# Patient Record
Sex: Female | Born: 1965 | Race: Black or African American | Hispanic: No | Smoking: Never smoker
Health system: Southern US, Community
[De-identification: ages and names within clinical notes are randomized; demographics above are authoritative.]

## PROBLEM LIST (undated history)

## (undated) ENCOUNTER — Emergency Department (HOSPITAL_COMMUNITY): Admission: EM | Payer: Medicaid Other

## (undated) DIAGNOSIS — C55 Malignant neoplasm of uterus, part unspecified: Secondary | ICD-10-CM

## (undated) DIAGNOSIS — G2581 Restless legs syndrome: Secondary | ICD-10-CM

## (undated) DIAGNOSIS — K219 Gastro-esophageal reflux disease without esophagitis: Secondary | ICD-10-CM

## (undated) DIAGNOSIS — D649 Anemia, unspecified: Secondary | ICD-10-CM

## (undated) DIAGNOSIS — F32A Depression, unspecified: Secondary | ICD-10-CM

## (undated) DIAGNOSIS — J45909 Unspecified asthma, uncomplicated: Secondary | ICD-10-CM

## (undated) DIAGNOSIS — J189 Pneumonia, unspecified organism: Secondary | ICD-10-CM

## (undated) DIAGNOSIS — Z923 Personal history of irradiation: Secondary | ICD-10-CM

## (undated) DIAGNOSIS — I10 Essential (primary) hypertension: Secondary | ICD-10-CM

## (undated) DIAGNOSIS — G459 Transient cerebral ischemic attack, unspecified: Secondary | ICD-10-CM

## (undated) DIAGNOSIS — E669 Obesity, unspecified: Secondary | ICD-10-CM

## (undated) DIAGNOSIS — G4733 Obstructive sleep apnea (adult) (pediatric): Secondary | ICD-10-CM

## (undated) DIAGNOSIS — G4700 Insomnia, unspecified: Secondary | ICD-10-CM

## (undated) DIAGNOSIS — I639 Cerebral infarction, unspecified: Secondary | ICD-10-CM

## (undated) DIAGNOSIS — F319 Bipolar disorder, unspecified: Secondary | ICD-10-CM

## (undated) DIAGNOSIS — F419 Anxiety disorder, unspecified: Secondary | ICD-10-CM

## (undated) HISTORY — DX: Personal history of irradiation: Z92.3

## (undated) HISTORY — DX: Depression, unspecified: F32.A

## (undated) HISTORY — DX: Transient cerebral ischemic attack, unspecified: G45.9

## (undated) HISTORY — PX: BREAST LUMPECTOMY: SHX2

## (undated) HISTORY — DX: Cerebral infarction, unspecified: I63.9

## (undated) HISTORY — DX: Bipolar disorder, unspecified: F31.9

## (undated) HISTORY — DX: Obstructive sleep apnea (adult) (pediatric): G47.33

## (undated) HISTORY — DX: Malignant neoplasm of uterus, part unspecified: C55

## (undated) HISTORY — DX: Unspecified asthma, uncomplicated: J45.909

## (undated) HISTORY — DX: Anxiety disorder, unspecified: F41.9

---

## 1988-10-21 HISTORY — PX: TUBAL LIGATION: SHX77

## 1998-03-27 ENCOUNTER — Emergency Department (HOSPITAL_COMMUNITY): Admission: EM | Admit: 1998-03-27 | Discharge: 1998-03-27 | Payer: Self-pay | Admitting: Emergency Medicine

## 2001-06-09 ENCOUNTER — Emergency Department (HOSPITAL_COMMUNITY): Admission: EM | Admit: 2001-06-09 | Discharge: 2001-06-09 | Payer: Self-pay | Admitting: Emergency Medicine

## 2001-06-09 ENCOUNTER — Encounter: Payer: Self-pay | Admitting: Emergency Medicine

## 2001-06-10 ENCOUNTER — Ambulatory Visit (HOSPITAL_COMMUNITY): Admission: RE | Admit: 2001-06-10 | Discharge: 2001-06-10 | Payer: Self-pay | Admitting: Emergency Medicine

## 2001-06-10 ENCOUNTER — Encounter: Payer: Self-pay | Admitting: Emergency Medicine

## 2001-06-18 ENCOUNTER — Encounter: Admission: RE | Admit: 2001-06-18 | Discharge: 2001-06-18 | Payer: Self-pay | Admitting: Internal Medicine

## 2004-12-21 ENCOUNTER — Ambulatory Visit: Payer: Self-pay | Admitting: Internal Medicine

## 2005-02-21 ENCOUNTER — Ambulatory Visit: Payer: Self-pay | Admitting: Internal Medicine

## 2005-04-03 ENCOUNTER — Emergency Department (HOSPITAL_COMMUNITY): Admission: EM | Admit: 2005-04-03 | Discharge: 2005-04-04 | Payer: Self-pay | Admitting: Emergency Medicine

## 2005-04-08 ENCOUNTER — Ambulatory Visit: Payer: Self-pay | Admitting: Internal Medicine

## 2005-04-30 ENCOUNTER — Ambulatory Visit: Payer: Self-pay | Admitting: Family Medicine

## 2006-04-15 ENCOUNTER — Ambulatory Visit: Payer: Self-pay | Admitting: Internal Medicine

## 2007-03-05 ENCOUNTER — Ambulatory Visit (HOSPITAL_COMMUNITY): Admission: RE | Admit: 2007-03-05 | Discharge: 2007-03-05 | Payer: Self-pay | Admitting: Family Medicine

## 2010-04-11 ENCOUNTER — Ambulatory Visit (HOSPITAL_COMMUNITY): Admission: RE | Admit: 2010-04-11 | Discharge: 2010-04-11 | Payer: Self-pay | Admitting: Family Medicine

## 2011-11-02 ENCOUNTER — Emergency Department (HOSPITAL_COMMUNITY)
Admission: EM | Admit: 2011-11-02 | Discharge: 2011-11-02 | Disposition: A | Discharge status: Home / Self Care | Payer: Medicaid Other | Attending: Emergency Medicine | Admitting: Emergency Medicine

## 2011-11-02 ENCOUNTER — Emergency Department (HOSPITAL_COMMUNITY): Payer: Medicaid Other

## 2011-11-02 ENCOUNTER — Encounter (HOSPITAL_COMMUNITY): Payer: Self-pay | Admitting: *Deleted

## 2011-11-02 DIAGNOSIS — R404 Transient alteration of awareness: Secondary | ICD-10-CM | POA: Insufficient documentation

## 2011-11-02 DIAGNOSIS — R51 Headache: Secondary | ICD-10-CM | POA: Insufficient documentation

## 2011-11-02 DIAGNOSIS — S0990XA Unspecified injury of head, initial encounter: Secondary | ICD-10-CM

## 2011-11-02 DIAGNOSIS — R21 Rash and other nonspecific skin eruption: Secondary | ICD-10-CM | POA: Insufficient documentation
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
## 2011-11-02 NOTE — ED Notes (Signed)
Patient transported to CT 
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
## 2011-11-03 NOTE — ED Provider Notes (Signed)
Medical screening examination/treatment/procedure(s) were performed by non-physician practitioner and as supervising physician I was immediately available for consultation/collaboration.  Loren Racer, MD 11/03/11 [phone]

## 2012-08-20 ENCOUNTER — Emergency Department (HOSPITAL_COMMUNITY)
Admission: EM | Admit: 2012-08-20 | Discharge: 2012-08-21 | Disposition: A | Discharge status: Home / Self Care | Payer: Medicaid Other | Attending: Emergency Medicine | Admitting: Emergency Medicine

## 2012-08-20 ENCOUNTER — Encounter (HOSPITAL_COMMUNITY): Payer: Self-pay | Admitting: Adult Health

## 2012-08-20 DIAGNOSIS — F319 Bipolar disorder, unspecified: Secondary | ICD-10-CM | POA: Insufficient documentation

## 2012-08-20 DIAGNOSIS — G4700 Insomnia, unspecified: Secondary | ICD-10-CM | POA: Insufficient documentation

## 2012-08-20 HISTORY — DX: Bipolar disorder, unspecified: F31.9

## 2012-08-20 NOTE — ED Notes (Signed)
Pt. Reports difficulty sleeping x1 week. States she has a hard time falling asleep and staying asleep. Reports 1-2 hours of sleep per night.

## 2012-08-20 NOTE — ED Notes (Addendum)
Pt reports inability to sleep for one week. Denies pain.  No other complaints.

## 2012-08-21 MED ORDER — ZOLPIDEM TARTRATE 5 MG PO TABS: 5.0000 mg | ORAL_TABLET | Freq: Every evening | ORAL | Status: DC | PRN

## 2012-08-21 NOTE — ED Provider Notes (Signed)
History     CSN: [id number]  Arrival date & time 08/20/12  2255   First MD Initiated Contact with Patient 08/21/12 0007      Chief Complaint  Patient presents with  . Insomnia    (Consider location/radiation/quality/duration/timing/severity/associated sxs/prior treatment) The history is provided by the patient.   46 year old female states that she has not been asleep for the last 5 days. 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 Patient denies other complaints. She denies headache, fever, chills, chest pain, nausea, vomiting, diarrhea, and arthralgias, myalgias.  Past Medical History  Diagnosis Date  . Manic depression     History reviewed. No pertinent past surgical history.  History reviewed. No pertinent family history.  History  Substance Use Topics  . Smoking status: Never Smoker   . Smokeless tobacco: Not on file  . Alcohol Use: No    OB History    Grav Para Term Preterm Abortions TAB SAB Ect Mult Living                  Review of Systems  All other systems reviewed and are negative.    Allergies  Review of patient's allergies indicates no known allergies.  Home Medications  No current outpatient prescriptions on file.  BP 148/91  Pulse 102  Temp 98.5 F (36.9 C) (Oral)  Resp 18  SpO2 95%  Physical Exam  Nursing note and vitals reviewed. 46 year old female, resting comfortably and in no acute distress. Vital signs are significant for mild hypertension with blood pressure 140/91, and borderline tachycardia with heart rate of 102. Oxygen saturation is 95%, which is normal. Head is normocephalic and atraumatic. PERRLA, EOMI. Oropharynx is clear. Neck is nontender and supple without adenopathy or JVD. Back is nontender and there is no CVA tenderness. Lungs are clear without rales, wheezes, or rhonchi. Chest is nontender. Heart has regular rate and rhythm without murmur. Abdomen is soft, flat,  nontender without masses or hepatosplenomegaly and peristalsis is normoactive. Extremities have no cyanosis or edema, full range of motion is present. Skin is warm and dry without rash. Neurologic: Mental status is normal, cranial nerves are intact, there are no motor or sensory deficits.   ED Course  Procedures (including critical care time)   1. Insomnia       MDM  Insomnia. She will be given a prescription for zolpidem to use as needed.        Dione Booze, MD 08/21/12 [phone]

## 2013-01-04 ENCOUNTER — Emergency Department (HOSPITAL_COMMUNITY)
Admission: EM | Admit: 2013-01-04 | Discharge: 2013-01-04 | Disposition: A | Discharge status: Home / Self Care | Payer: Medicaid Other | Attending: Emergency Medicine | Admitting: Emergency Medicine

## 2013-01-04 ENCOUNTER — Encounter (HOSPITAL_COMMUNITY): Payer: Self-pay | Admitting: Emergency Medicine

## 2013-01-04 DIAGNOSIS — L0231 Cutaneous abscess of buttock: Secondary | ICD-10-CM | POA: Insufficient documentation

## 2013-01-04 DIAGNOSIS — Z8659 Personal history of other mental and behavioral disorders: Secondary | ICD-10-CM | POA: Insufficient documentation

## 2013-01-04 DIAGNOSIS — L03317 Cellulitis of buttock: Secondary | ICD-10-CM | POA: Insufficient documentation

## 2013-01-04 MED ORDER — HYDROCODONE-ACETAMINOPHEN 5-325 MG PO TABS
2.0000 | ORAL_TABLET | Freq: Once | ORAL | Status: AC
  Administered 2013-01-04: 2 via ORAL
  Filled 2013-01-04: qty 2

## 2013-01-04 MED ORDER — HYDROCODONE-ACETAMINOPHEN 5-325 MG PO TABS
1.0000 | ORAL_TABLET | Freq: Four times a day (QID) | ORAL | Status: DC | PRN
Start: 2013-01-04 — End: 2014-06-06

## 2013-01-04 MED ORDER — DOXYCYCLINE HYCLATE 100 MG PO CAPS: 100.0000 mg | ORAL_CAPSULE | Freq: Two times a day (BID) | ORAL | Status: DC

## 2013-01-04 NOTE — ED Notes (Signed)
Pt c/o abscess to left buttocks x several days

## 2013-01-04 NOTE — ED Provider Notes (Signed)
History  This chart was scribed for Suzi Roots, MD by Bennett Scrape, ED Scribe. This patient was seen in room TR08C/TR08C and the patient's care was started at 5:41 PM.  CSN: [id number]  Arrival date & time 01/04/13  1732   First MD Initiated Contact with Patient 01/04/13 1741      Chief Complaint  Patient presents with  . Abscess     The history is provided by the patient. No language interpreter was used.    Sarah Bean is a 47 y.o. female who presents to the Emergency Department complaining of 3 days of gradual onset, gradually worsening, constant abscess with associated dull pain to upper left buttocks. She denies any known injuries or insect bites to the area. The pain is worse with sitting on the area. She denies having a h/o abscess or staff infections. She denies any fevers. She has a h/o depression and denies smoking and alcohol use.  No fever or chills. No nv. Having normal bms.      Past Medical History  Diagnosis Date  . Manic depression     History reviewed. No pertinent past surgical history.  History reviewed. No pertinent family history.  History  Substance Use Topics  . Smoking status: Never Smoker   . Smokeless tobacco: Not on file  . Alcohol Use: No    No OB history provided.  Review of Systems  Constitutional: Negative for fever and chills.  Gastrointestinal: Negative for nausea and vomiting.  Skin: Negative for rash and wound.       Positive for abscess     Allergies  Review of patient's allergies indicates no known allergies.  Home Medications  No current outpatient prescriptions on file.  Triage Vitals: BP 164/84  Pulse 116  Temp(Src) 98.5 F (36.9 C) (Oral)  Resp 20  SpO2 100%  Physical Exam  Nursing note and vitals reviewed. Constitutional: She is oriented to person, place, and time. She appears well-developed and well-nourished. No distress.  HENT:  Head: Normocephalic and atraumatic.  Eyes: Conjunctivae are normal.  No scleral icterus.  Neck: Neck supple. No tracheal deviation present.  Cardiovascular: Normal rate.   Pulmonary/Chest: Effort normal. No respiratory distress.  Abdominal: Soft. She exhibits no distension and no mass. There is no tenderness. There is no rebound and no guarding.  obese  Genitourinary:  4 cm abscess in the left crease area, chaperone present  Musculoskeletal: Normal range of motion.  Neurological: She is alert and oriented to person, place, and time.  Skin: Skin is warm and dry. No rash noted.  4 cm diameter abscess to left buttock lateral and superior to rectum, does not appear to extend to anus/rectum. Mild surrounding erythema.  Psychiatric: She has a normal mood and affect. Her behavior is normal.    ED Course  Procedures (including critical care time)  DIAGNOSTIC STUDIES: Oxygen Saturation is 100% on room air, normal by my interpretation.    COORDINATION OF CARE: 5:47 PM-Discussed treatment plan which includes I&D with pt at bedside and pt agreed to plan.   INCISION AND DRAINAGE PROCEDURE NOTE: Patient identification was confirmed and verbal consent was obtained. This procedure was performed by Suzi Roots, MD at 6:18 PM. Site: 4 cm abscess in the left crease Sterile procedures observed Anesthetic used (type and amt): 7 ccs lidocaine 2% with epi Drainage: copious amount of purulent drainage Complexity: Complex Packing used Site anesthetized, incision made over site, wound drained and explored loculations, rinsed with copious amounts  of normal saline, wound packed with sterile gauze, covered with dry, sterile dressing.  Pt tolerated procedure well without complications.  Instructions for care discussed verbally and pt provided with additional written instructions for homecare and f/u.     MDM  I personally performed the services described in this documentation, which was scribed in my presence. The recorded information has been reviewed and is  accurate.   Pt has ride, does not have to drive. No meds pta.   vicodin po.   I and D abscess.   Discussed return to [facility] for wound check/packing removal in 2 days.  Hr 92 rr 16.   Given mild surrounding erythema, will give rx doxy.   norco for pain.   Suzi Roots, MD 01/04/13 [phone]

## 2013-01-06 ENCOUNTER — Emergency Department (INDEPENDENT_AMBULATORY_CARE_PROVIDER_SITE_OTHER)
Admission: EM | Admit: 2013-01-06 | Discharge: 2013-01-06 | Disposition: A | Discharge status: Home / Self Care | Payer: Medicaid Other | Source: Home / Self Care | Attending: Emergency Medicine | Admitting: Emergency Medicine

## 2013-01-06 ENCOUNTER — Encounter (HOSPITAL_COMMUNITY): Payer: Self-pay | Admitting: *Deleted

## 2013-01-06 DIAGNOSIS — L0291 Cutaneous abscess, unspecified: Secondary | ICD-10-CM

## 2013-01-06 NOTE — ED Notes (Addendum)
Pt     Seen  2  Days  Ago in  The  Er  For         Buttock  abcess        Pt  Reports   Continues  To  Have  Pain          She  Was  Given  RX  For  Pain pills  And    Anti  Biotics

## 2013-01-06 NOTE — ED Provider Notes (Signed)
Chief Complaint:   Chief Complaint  Patient presents with  . Abscess    History of Present Illness:    Sarah Bean is a 47 year old female who has had a one-week history of painful abscess on her left buttock. She went to the emergency room Monday, 3 days ago and had this incised and drained. She was placed on doxycycline which she has been taking. It is getting better. It's less painful. The packing came out on its own. Denies any fever or chills.  Review of Systems:  Other than noted above, the patient denies any of the following symptoms: Systemic:  No fever, chills or sweats. Skin:  No rash or itching.  PMFSH:  Past medical history, family history, social history, meds, and allergies were reviewed.  No history of diabetes or prior history of abscesses or MRSA.  Physical Exam:   Vital signs:  BP 129/87  Pulse 112  Temp(Src) 98.2 F (36.8 C) (Oral)  Resp 16  SpO2 98% Skin:  She has an abscess on her left buttock that has been incised and drained. The packing is removed. Wound cavity appears clear. There still a little surrounding induration, but no fluctuance no purulent drainage.  Skin exam was otherwise normal.  No rash. Ext:  Distal pulses were full, patient has full ROM of all joints.  Assessment:  The encounter diagnosis was Abscess.  Appears to be healing up well. Advised twice daily hot sitz baths, application of antibiotic ointment, and finishing up her antibiotic.  Plan:   1.  The following meds were prescribed:   Discharge Medication List as of 01/06/2013  1:31 PM     2.  The patient was instructed in symptomatic care and handouts were given. 3.  The patient was instructed in wound care.  Given red flag symptoms such as fever or worsening pain that would indicate earlier return.   Reuben Likes, MD 01/06/13 2130

## 2013-11-28 ENCOUNTER — Emergency Department (INDEPENDENT_AMBULATORY_CARE_PROVIDER_SITE_OTHER)
Admission: EM | Admit: 2013-11-28 | Discharge: 2013-11-28 | Disposition: A | Discharge status: Home / Self Care | Payer: Medicaid Other | Source: Home / Self Care | Attending: Emergency Medicine | Admitting: Emergency Medicine

## 2013-11-28 ENCOUNTER — Emergency Department (INDEPENDENT_AMBULATORY_CARE_PROVIDER_SITE_OTHER): Payer: Medicaid Other

## 2013-11-28 ENCOUNTER — Encounter (HOSPITAL_COMMUNITY): Payer: Self-pay | Admitting: Emergency Medicine

## 2013-11-28 DIAGNOSIS — J40 Bronchitis, not specified as acute or chronic: Secondary | ICD-10-CM

## 2013-11-28 MED ORDER — AZITHROMYCIN 250 MG PO TABS: ORAL_TABLET | ORAL | Status: DC

## 2013-11-28 MED ORDER — ALBUTEROL SULFATE HFA 108 (90 BASE) MCG/ACT IN AERS: 1.0000 | INHALATION_SPRAY | RESPIRATORY_TRACT | Status: DC | PRN

## 2013-11-28 MED ORDER — METHYLPREDNISOLONE 4 MG PO KIT: PACK | ORAL | Status: DC

## 2013-11-28 MED ORDER — GUAIFENESIN-CODEINE 100-10 MG/5ML PO SOLN: 10.0000 mL | Freq: Four times a day (QID) | ORAL | Status: DC | PRN

## 2013-11-28 NOTE — ED Provider Notes (Signed)
CSN: [id number]     Arrival date & time 11/28/13  1709 History   First MD Initiated Contact with Patient 11/28/13 1744     Chief Complaint  Patient presents with  . URI   (Consider location/radiation/quality/duration/timing/severity/associated sxs/prior Treatment) HPI Comments: 48 year old female presents complaining of cough, fever, sore throat, body aches for 4 days, getting progressively worse. She has been taking over-the-counter medications but they are not helping. She says she also has a history of asthma and she has been having some wheezing. Additionally she admits to shortness of breath on exertion over the past 2 days. No chest pain, NVD, abdominal pain, rash, headache  Patient is a 48 y.o. female presenting with URI.  URI Presenting symptoms: congestion, cough, fatigue, fever and sore throat   Associated symptoms: wheezing   Associated symptoms: no arthralgias and no myalgias     Past Medical History  Diagnosis Date  . Manic depression    History reviewed. No pertinent past surgical history. History reviewed. No pertinent family history. History  Substance Use Topics  . Smoking status: Never Smoker   . Smokeless tobacco: Not on file  . Alcohol Use: No   OB History   Grav Para Term Preterm Abortions TAB SAB Ect Mult Living                 Review of Systems  Constitutional: Positive for fever, chills and fatigue.  HENT: Positive for congestion and sore throat.   Eyes: Negative for visual disturbance.  Respiratory: Positive for cough, chest tightness, shortness of breath and wheezing.   Cardiovascular: Negative for chest pain, palpitations and leg swelling.  Gastrointestinal: Negative for nausea, vomiting and abdominal pain.  Endocrine: Negative for polydipsia and polyuria.  Genitourinary: Negative for dysuria, urgency and frequency.  Musculoskeletal: Negative for arthralgias and myalgias.  Skin: Negative for rash.  Neurological: Negative for dizziness, weakness  and light-headedness.    Allergies  Review of patient's allergies indicates no known allergies.  Home Medications   Current Outpatient Rx  Name  Route  Sig  Dispense  Refill  . albuterol (PROVENTIL HFA;VENTOLIN HFA) 108 (90 BASE) MCG/ACT inhaler   Inhalation   Inhale 1-2 puffs into the lungs every 4 (four) hours as needed for wheezing or shortness of breath.   1 Inhaler   0   . azithromycin (ZITHROMAX Z-PAK) 250 MG tablet      Use as directed   6 each   0   . doxycycline (VIBRAMYCIN) 100 MG capsule   Oral   Take 1 capsule (100 mg total) by mouth 2 (two) times daily.   14 capsule   0   . guaiFENesin-codeine 100-10 MG/5ML syrup   Oral   Take 10 mLs by mouth every 6 (six) hours as needed for cough.   120 mL   0   . HYDROcodone-acetaminophen (NORCO/VICODIN) 5-325 MG per tablet   Oral   Take 1-2 tablets by mouth every 6 (six) hours as needed for pain.   20 tablet   0   . methylPREDNISolone (MEDROL DOSEPAK) 4 MG tablet      follow package directions   21 tablet   0     Dispense as written.    BP 167/114  Pulse 85  Temp(Src) 98.8 F (37.1 C) (Oral)  Resp 19  SpO2 100%  LMP 11/20/2013 Physical Exam  Nursing note and vitals reviewed. Constitutional: She is oriented to person, place, and time. Vital signs are normal. She appears well-developed and  well-nourished. No distress.  Obese habitus  HENT:  Head: Normocephalic and atraumatic.  Right Ear: External ear normal.  Left Ear: External ear normal.  Nose: Nose normal.  Mouth/Throat: Oropharynx is clear and moist. No oropharyngeal exudate.  Neck: Normal range of motion. Neck supple.  Cardiovascular: Regular rhythm and normal heart sounds.  Tachycardia present.  Exam reveals no gallop and no friction rub.   No murmur heard. Pulmonary/Chest: Breath sounds normal. Tachypnea noted. No respiratory distress. She has no wheezes. She has no rales.  Prolonged expiratory phase Cardiopulmonary exam difficult due to  habitus  Lymphadenopathy:    She has no cervical adenopathy.  Neurological: She is alert and oriented to person, place, and time. She has normal strength. Coordination normal.  Skin: Skin is warm and dry. No rash noted. She is not diaphoretic.  Psychiatric: She has a normal mood and affect. Judgment normal.    ED Course  Procedures (including critical care time) Labs Review Labs Reviewed - No data to display Imaging Review Dg Chest 2 View  11/28/2013   CLINICAL DATA:  Productive cough for 3 days.  EXAM: CHEST  2 VIEW  COMPARISON:  04/03/2005.  FINDINGS: The heart size and mediastinal contours are stable. There is mild central airway thickening without confluent airspace opacity or hyperinflation. There is no pleural effusion or pneumothorax. Scattered osteophytes of the thoracic spine are noted.  IMPRESSION: Central airway thickening suggesting bronchitis. No evidence of pneumonia.   Electronically Signed   By: Camie Patience M.D.   On: 11/28/2013 18:32      MDM   1. Bronchitis    Chest x-ray indicates bronchitis. Worsening still for 5 days, will treat with antibiotics in addition to symptomatic management. Followup when necessary if not improving  Meds ordered this encounter  Medications  . azithromycin (ZITHROMAX Z-PAK) 250 MG tablet    Sig: Use as directed    Dispense:  6 each    Refill:  0    Order Specific Question:  Supervising Provider    Answer:  Lynne Leader, [place]  . methylPREDNISolone (MEDROL DOSEPAK) 4 MG tablet    Sig: follow package directions    Dispense:  21 tablet    Refill:  0    Order Specific Question:  Supervising Provider    Answer:  Lynne Leader, [place]  . albuterol (PROVENTIL HFA;VENTOLIN HFA) 108 (90 BASE) MCG/ACT inhaler    Sig: Inhale 1-2 puffs into the lungs every 4 (four) hours as needed for wheezing or shortness of breath.    Dispense:  1 Inhaler    Refill:  0    Order Specific Question:  Supervising Provider    Answer:  Lynne Leader, [place]   . guaiFENesin-codeine 100-10 MG/5ML syrup    Sig: Take 10 mLs by mouth every 6 (six) hours as needed for cough.    Dispense:  120 mL    Refill:  0    Order Specific Question:  Supervising Provider    Answer:  Lynne Leader, [place]     Liam Graham, PA-C 11/28/13 [phone]

## 2013-11-28 NOTE — Discharge Instructions (Signed)

## 2013-11-28 NOTE — ED Notes (Signed)
C/o cold sx states she is coughing, fever, sneezing, and constipated for four days  Denies any vomiting or diarrhea  Has taken OTC medication but no relief.

## 2013-11-29 NOTE — ED Provider Notes (Signed)
Medical screening examination/treatment/procedure(s) were performed by non-physician practitioner and as supervising physician I was immediately available for consultation/collaboration.  Philipp Deputy, M.D.  Harden Mo, MD 11/29/13 [phone]

## 2014-06-05 ENCOUNTER — Encounter (HOSPITAL_COMMUNITY): Payer: Self-pay | Admitting: Emergency Medicine

## 2014-06-05 DIAGNOSIS — N898 Other specified noninflammatory disorders of vagina: Secondary | ICD-10-CM | POA: Insufficient documentation

## 2014-06-05 DIAGNOSIS — Z3202 Encounter for pregnancy test, result negative: Secondary | ICD-10-CM | POA: Diagnosis not present

## 2014-06-05 DIAGNOSIS — Z79899 Other long term (current) drug therapy: Secondary | ICD-10-CM | POA: Diagnosis not present

## 2014-06-05 DIAGNOSIS — D500 Iron deficiency anemia secondary to blood loss (chronic): Secondary | ICD-10-CM | POA: Insufficient documentation

## 2014-06-05 DIAGNOSIS — E669 Obesity, unspecified: Secondary | ICD-10-CM | POA: Insufficient documentation

## 2014-06-05 DIAGNOSIS — Z792 Long term (current) use of antibiotics: Secondary | ICD-10-CM | POA: Diagnosis not present

## 2014-06-05 LAB — BASIC METABOLIC PANEL
Anion gap: 12 (ref 5–15)
BUN: 7 mg/dL (ref 6–23)
CO2: 24 mEq/L (ref 19–32)
Calcium: 9.2 mg/dL (ref 8.4–10.5)
Chloride: 102 mEq/L (ref 96–112)
Creatinine, Ser: 0.95 mg/dL (ref 0.50–1.10)
GFR calc Af Amer: 82 mL/min — ABNORMAL LOW (ref >90)
GFR calc non Af Amer: 71 mL/min — ABNORMAL LOW (ref >90)
Glucose, Bld: 81 mg/dL (ref 70–99)
Potassium: 3.8 mEq/L (ref 3.7–5.3)
Sodium: 138 mEq/L (ref 137–147)

## 2014-06-05 LAB — CBC WITH DIFFERENTIAL/PLATELET
Basophils Absolute: 0 10*3/uL (ref 0.0–0.1)
Basophils Relative: 0 % (ref 0–1)
Eosinophils Absolute: 0.1 10*3/uL (ref 0.0–0.7)
Eosinophils Relative: 1 % (ref 0–5)
HCT: 28.1 % — ABNORMAL LOW (ref 36.0–46.0)
Hemoglobin: 8.9 g/dL — ABNORMAL LOW (ref 12.0–15.0)
Lymphocytes Relative: 25 % (ref 12–46)
Lymphs Abs: 2.2 10*3/uL (ref 0.7–4.0)
MCH: 22.1 pg — ABNORMAL LOW (ref 26.0–34.0)
MCHC: 31.7 g/dL (ref 30.0–36.0)
MCV: 69.9 fL — ABNORMAL LOW (ref 78.0–100.0)
Monocytes Absolute: 0.4 10*3/uL (ref 0.1–1.0)
Monocytes Relative: 4 % (ref 3–12)
Neutro Abs: 6.1 10*3/uL (ref 1.7–7.7)
Neutrophils Relative %: 70 % (ref 43–77)
Platelets: 413 10*3/uL — ABNORMAL HIGH (ref 150–400)
RBC: 4.02 MIL/uL (ref 3.87–5.11)
RDW: 18.8 % — ABNORMAL HIGH (ref 11.5–15.5)
WBC: 8.8 10*3/uL (ref 4.0–10.5)
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
## 2014-06-06 ENCOUNTER — Emergency Department (HOSPITAL_COMMUNITY)
Admission: EM | Admit: 2014-06-06 | Discharge: 2014-06-06 | Disposition: A | Discharge status: Home / Self Care | Payer: Medicaid Other | Attending: Emergency Medicine | Admitting: Emergency Medicine

## 2014-06-06 DIAGNOSIS — D500 Iron deficiency anemia secondary to blood loss (chronic): Secondary | ICD-10-CM

## 2014-06-06 DIAGNOSIS — E669 Obesity, unspecified: Secondary | ICD-10-CM

## 2014-06-06 LAB — POC URINE PREG, ED: PREG TEST UR: NEGATIVE

## 2014-06-06 MED ORDER — SODIUM CHLORIDE 0.9 % IV BOLUS (SEPSIS)
1000.0000 mL | Freq: Once | INTRAVENOUS | Status: AC
  Administered 2014-06-06: 1000 mL via INTRAVENOUS

## 2014-06-06 MED ORDER — FERROUS SULFATE 325 (65 FE) MG PO TABS: 325.0000 mg | ORAL_TABLET | Freq: Every day | ORAL | Status: DC

## 2014-06-06 NOTE — ED Provider Notes (Signed)
CSN: [id number]     Arrival date & time 06/05/14  2216 History   None    Chief Complaint  Patient presents with  . Vaginal Bleeding     (Consider location/radiation/quality/duration/timing/severity/associated sxs/prior Treatment) HPI\ This is an obese 48 yo woman with a long history of menorrhagia. She comes in after experiencing heavy vaginal bleeding at home. Her menstrual period began 2 days ago. However, she had bleeding for < 24 hrs. Typically, her periods last for several days. Tonight, she was standing and felt a sudden gush of vaginal bleeding. She says that blood soaked through her clothes and pooled on floor. She did not have on a pad or tampon. The blood was dark red rather than light red. Since then, she has had some moderate flow which, she says, is typical of menstrual bleeding. No abdominal pain.   She denies experiencing lightheadedness, near syncope, SOB and chest pain. The patient does not see a doctor regularly. She says she has never been evaluated by a gynecologist for [place].   Past Medical History  Diagnosis Date  . Manic depression    History reviewed. No pertinent past surgical history. History reviewed. No pertinent family history. History  Substance Use Topics  . Smoking status: Never Smoker   . Smokeless tobacco: Never Used  . Alcohol Use: No   OB History   Grav Para Term Preterm Abortions TAB SAB Ect Mult Living                 Review of Systems 10 point review of symptoms obtained and is negative with the exceptions of symptoms noted abov.e   Allergies  Review of patient's allergies indicates no known allergies.  Home Medications   Prior to Admission medications   Medication Sig Start Date End Date Taking? Authorizing Provider  albuterol (PROVENTIL HFA;VENTOLIN HFA) 108 (90 BASE) MCG/ACT inhaler Inhale 1-2 puffs into the lungs every 4 (four) hours as needed for wheezing or shortness of breath. 11/28/13   Liam Graham, PA-C  azithromycin  (ZITHROMAX Z-PAK) 250 MG tablet Use as directed 11/28/13   Liam Graham, PA-C  doxycycline (VIBRAMYCIN) 100 MG capsule Take 1 capsule (100 mg total) by mouth 2 (two) times daily. 01/04/13   Mirna Mires, MD  guaiFENesin-codeine 100-10 MG/5ML syrup Take 10 mLs by mouth every 6 (six) hours as needed for cough. 11/28/13   Liam Graham, PA-C  HYDROcodone-acetaminophen (NORCO/VICODIN) 5-325 MG per tablet Take 1-2 tablets by mouth every 6 (six) hours as needed for pain. 01/04/13   Mirna Mires, MD  methylPREDNISolone (MEDROL DOSEPAK) 4 MG tablet follow package directions 11/28/13   Liam Graham, PA-C   BP 147/86  Pulse 85  Temp(Src) 98.4 F (36.9 C) (Oral)  Resp 16  Ht 5\' 4"  (1.626 m)  Wt 260 lb (117.935 kg)  BMI 44.61 kg/m2  SpO2 100%  LMP 06/03/2014 Physical Exam  Gen: well nourished and well developed appearing Head: NCAT Ears: normal to inspection Nose: normal to inspection, no epistaxis or drainage Mouth: oral mucsoa is well hydrated appearing, normal posterior oropharynx Neck: supple, no stridor CV: RRR,  Pulse 88, no murmur, palpable peripheral pulses Resp: lung sounds are clear to auscultation bilaterally, no wheeing or rhonchi or rales, normal respiratory effort.  Abd: Morbidly obese, soft, nontender, nondistended Extremities: normal to inspection.  Skin: warm and dry Neuro: CN ii - XII, no focal deficitis Psyche; normal affect, cooperative.   ED Course  Procedures (including critical care time)  Labs Review  Results for orders placed during the hospital encounter of 06/06/14 (from the past 24 hour(s))  CBC WITH DIFFERENTIAL     Status: Abnormal   Collection Time    06/05/14 10:29 PM      Result Value Ref Range   WBC 8.8  4.0 - 10.5 K/uL   RBC 4.02  3.87 - 5.11 MIL/uL   Hemoglobin 8.9 (*) 12.0 - 15.0 g/dL   HCT 28.1 (*) 36.0 - 46.0 %   MCV 69.9 (*) 78.0 - 100.0 fL   MCH 22.1 (*) 26.0 - 34.0 pg   MCHC 31.7  30.0 - 36.0 g/dL   RDW 18.8 (*) 11.5 - 15.5 %    Platelets 413 (*) 150 - 400 K/uL   Neutrophils Relative % 70  43 - 77 %   Lymphocytes Relative 25  12 - 46 %   Monocytes Relative 4  3 - 12 %   Eosinophils Relative 1  0 - 5 %   Basophils Relative 0  0 - 1 %   Neutro Abs 6.1  1.7 - 7.7 K/uL   Lymphs Abs 2.2  0.7 - 4.0 K/uL   Monocytes Absolute 0.4  0.1 - 1.0 K/uL   Eosinophils Absolute 0.1  0.0 - 0.7 K/uL   Basophils Absolute 0.0  0.0 - 0.1 K/uL   RBC Morphology POLYCHROMASIA PRESENT     Smear Review LARGE PLATELETS PRESENT    BASIC METABOLIC PANEL     Status: Abnormal   Collection Time    06/05/14 10:29 PM      Result Value Ref Range   Sodium 138  137 - 147 mEq/L   Potassium 3.8  3.7 - 5.3 mEq/L   Chloride 102  96 - 112 mEq/L   CO2 24  19 - 32 mEq/L   Glucose, Bld 81  70 - 99 mg/dL   BUN 7  6 - 23 mg/dL   Creatinine, Ser 0.95  0.50 - 1.10 mg/dL   Calcium 9.2  8.4 - 10.5 mg/dL   GFR calc non Af Amer 71 (*) >90 mL/min   GFR calc Af Amer 82 (*) >90 mL/min   Anion gap 12  5 - 15  POC URINE PREG, ED     Status: None   Collection Time    06/06/14  1:28 AM      Result Value Ref Range   Preg Test, Ur NEGATIVE  NEGATIVE    MDM   The patient declines pelvic exam stating that she has a disabled child at home that she has to get back to ASAP.  She has used no more than a single pad throughout her multi hour ED stay. She was tachycardic on arrival but, her VS have normalized. She has microcytic anemia and we will start her on iron. She is counseled to f/u with gynecology. We discussed return precautions. Her urine preg is negative.     Elyn Peers, MD 06/06/14 0330

## 2014-06-06 NOTE — ED Notes (Signed)
Pelvic cart at bedside. 

## 2014-09-26 ENCOUNTER — Encounter (HOSPITAL_COMMUNITY): Payer: Self-pay

## 2014-09-26 ENCOUNTER — Emergency Department (INDEPENDENT_AMBULATORY_CARE_PROVIDER_SITE_OTHER)
Admission: EM | Admit: 2014-09-26 | Discharge: 2014-09-26 | Disposition: A | Discharge status: Home / Self Care | Payer: Medicaid Other | Source: Home / Self Care | Attending: Emergency Medicine | Admitting: Emergency Medicine

## 2014-09-26 DIAGNOSIS — IMO0001 Reserved for inherently not codable concepts without codable children: Secondary | ICD-10-CM

## 2014-09-26 DIAGNOSIS — R030 Elevated blood-pressure reading, without diagnosis of hypertension: Secondary | ICD-10-CM

## 2014-09-26 DIAGNOSIS — J019 Acute sinusitis, unspecified: Secondary | ICD-10-CM

## 2014-09-26 NOTE — ED Notes (Signed)
C/o cough x 1 week, minimal relief w OTC medications. NAD

## 2014-09-26 NOTE — Discharge Instructions (Signed)
F/u with a primary care doctor asap to have your bp rechecked and managed.

## 2014-09-26 NOTE — ED Provider Notes (Signed)
CSN: [id number]     Arrival date & time 09/26/14  0912 History   First MD Initiated Contact with Patient 09/26/14 1056     Chief Complaint  Patient presents with  . URI   (Consider location/radiation/quality/duration/timing/severity/associated sxs/prior Treatment) HPI Comments: Pt reports green sputum and green discharge from nose. Pt does not have pcp. Does not currently take bp medicine. Ran out of albuterol inhaler, requests refill.   Patient is a 48 y.o. female presenting with URI. The history is provided by the patient.  URI Presenting symptoms: congestion and cough   Presenting symptoms: no ear pain, no facial pain, no fever, no rhinorrhea and no sore throat   Severity:  Moderate Onset quality:  Gradual Duration:  1 week Timing:  Intermittent Progression:  Worsening Chronicity:  New Relieved by:  Nothing Worsened by:  Nothing tried Ineffective treatments:  OTC medications Associated symptoms: no sinus pain and no wheezing     Past Medical History  Diagnosis Date  . Manic depression    History reviewed. No pertinent past surgical history. History reviewed. No pertinent family history. History  Substance Use Topics  . Smoking status: Never Smoker   . Smokeless tobacco: Never Used  . Alcohol Use: No   OB History    No data available     Review of Systems  Constitutional: Negative for fever and chills.  HENT: Positive for congestion and postnasal drip. Negative for ear pain, rhinorrhea, sinus pressure and sore throat.   Respiratory: Positive for cough. Negative for wheezing.     Allergies  Review of patient's allergies indicates no known allergies.  Home Medications   Prior to Admission medications   Medication Sig Start Date End Date Taking? Authorizing Provider  Acetaminophen-Caff-Pyrilamine (MIDOL COMPLETE PO) Take 2 tablets by mouth every 8 (eight) hours as needed (for pain).    Historical Provider, MD  albuterol (PROVENTIL HFA;VENTOLIN HFA) 108 (90 BASE)  MCG/ACT inhaler Inhale 1-2 puffs into the lungs every 4 (four) hours as needed for wheezing or shortness of breath. 11/28/13   Liam Graham, PA-C  ferrous sulfate 325 (65 FE) MG tablet Take 1 tablet (325 mg total) by mouth daily. 06/06/14   Elyn Peers, MD  QUEtiapine (SEROQUEL) 200 MG tablet [name]    Historical Provider, MD  traZODone (DESYREL) 100 MG tablet Take 100 mg by mouth at bedtime.    Historical Provider, MD   BP 150/104 mmHg  Pulse 92  Temp(Src) 98.8 F (37.1 C) (Oral)  Resp 16  SpO2 100% Physical Exam  Constitutional: She appears well-developed and well-nourished. No distress.  HENT:  Right Ear: Tympanic membrane, external ear and ear canal normal.  Left Ear: Tympanic membrane, external ear and ear canal normal.  Nose: Mucosal edema present. No rhinorrhea. Right sinus exhibits no maxillary sinus tenderness and no frontal sinus tenderness. Left sinus exhibits no maxillary sinus tenderness and no frontal sinus tenderness.  Unable to see pharynx.   Cardiovascular: Normal rate and regular rhythm.   Pulmonary/Chest: Effort normal and breath sounds normal.  No coughing  Lymphadenopathy:       Head (right side): No submental, no submandibular and no tonsillar adenopathy present.       Head (left side): No submental, no submandibular and no tonsillar adenopathy present.    She has no cervical adenopathy.    ED Course  Procedures (including critical care time) Labs Review Labs Reviewed - No data to display  Imaging Review No  results found.  Although pt does not have sinus pressure or tenderness to palp, pt's sx of green sputum and green nasal discharge and duration of sx lead me to suspect sinusitis.   MDM   1. Acute sinusitis, recurrence not specified, unspecified location   2. Elevated blood pressure        Carvel Getting, NP 09/26/14 Partridge Kabbe, NP 09/26/14 1112

## 2015-04-05 ENCOUNTER — Encounter: Payer: Self-pay | Admitting: *Deleted

## 2015-04-26 ENCOUNTER — Encounter: Payer: Medicaid Other | Admitting: Obstetrics & Gynecology

## 2015-08-28 ENCOUNTER — Emergency Department (HOSPITAL_COMMUNITY): Payer: Medicaid Other

## 2015-08-28 ENCOUNTER — Emergency Department (HOSPITAL_COMMUNITY)
Admission: EM | Admit: 2015-08-28 | Discharge: 2015-08-28 | Disposition: A | Discharge status: Home / Self Care | Payer: Medicaid Other | Attending: Emergency Medicine | Admitting: Emergency Medicine

## 2015-08-28 ENCOUNTER — Encounter (HOSPITAL_COMMUNITY): Payer: Self-pay | Admitting: *Deleted

## 2015-08-28 DIAGNOSIS — Y9389 Activity, other specified: Secondary | ICD-10-CM | POA: Diagnosis not present

## 2015-08-28 DIAGNOSIS — Y998 Other external cause status: Secondary | ICD-10-CM | POA: Insufficient documentation

## 2015-08-28 DIAGNOSIS — M79641 Pain in right hand: Secondary | ICD-10-CM

## 2015-08-28 DIAGNOSIS — R0789 Other chest pain: Secondary | ICD-10-CM

## 2015-08-28 DIAGNOSIS — Y92410 Unspecified street and highway as the place of occurrence of the external cause: Secondary | ICD-10-CM | POA: Diagnosis not present

## 2015-08-28 DIAGNOSIS — F339 Major depressive disorder, recurrent, unspecified: Secondary | ICD-10-CM | POA: Diagnosis not present

## 2015-08-28 DIAGNOSIS — S6991XA Unspecified injury of right wrist, hand and finger(s), initial encounter: Secondary | ICD-10-CM | POA: Diagnosis present

## 2015-08-28 DIAGNOSIS — E669 Obesity, unspecified: Secondary | ICD-10-CM | POA: Insufficient documentation

## 2015-08-28 DIAGNOSIS — Z79899 Other long term (current) drug therapy: Secondary | ICD-10-CM | POA: Diagnosis not present

## 2015-08-28 DIAGNOSIS — S299XXA Unspecified injury of thorax, initial encounter: Secondary | ICD-10-CM | POA: Diagnosis not present

## 2015-08-28 DIAGNOSIS — S3992XA Unspecified injury of lower back, initial encounter: Secondary | ICD-10-CM | POA: Insufficient documentation

## 2015-08-28 HISTORY — DX: Obesity, unspecified: E66.9

## 2015-08-28 MED ORDER — CYCLOBENZAPRINE HCL 10 MG PO TABS
5.0000 mg | ORAL_TABLET | Freq: Once | ORAL | Status: AC
  Administered 2015-08-28: 5 mg via ORAL
  Filled 2015-08-28: qty 1

## 2015-08-28 MED ORDER — IBUPROFEN 800 MG PO TABS
800.0000 mg | ORAL_TABLET | Freq: Once | ORAL | Status: AC
  Administered 2015-08-28: 800 mg via ORAL
  Filled 2015-08-28: qty 1

## 2015-08-28 MED ORDER — CYCLOBENZAPRINE HCL 5 MG PO TABS: 5.0000 mg | ORAL_TABLET | Freq: Three times a day (TID) | ORAL | Status: DC | PRN

## 2015-08-28 NOTE — Discharge Instructions (Signed)
Chest Wall Pain °Chest wall pain is pain in or around the bones and muscles of your chest. Sometimes, an injury causes this pain. Sometimes, the cause may not be known. This pain may take several weeks or longer to get better. °HOME CARE °Pay attention to any changes in your symptoms. Take these actions to help with your pain: °· Rest as told by your doctor. °· Avoid activities that cause pain. Try not to use your chest, belly (abdominal), or side muscles to lift heavy things. °· If directed, apply ice to the painful area: °¨ Put ice in a plastic bag. °¨ Place a towel between your skin and the bag. °¨ Leave the ice on for 20 minutes, 2-3 times per day. °· Take over-the-counter and prescription medicines only as told by your doctor. °· Do not use tobacco products, including cigarettes, chewing tobacco, and e-cigarettes. If you need help quitting, ask your doctor. °· Keep all follow-up visits as told by your doctor. This is important. °GET HELP IF: °· You have a fever. °· Your chest pain gets worse. °· You have new symptoms. °GET HELP RIGHT AWAY IF: °· You feel sick to your stomach (nauseous) or you throw up (vomit). °· You feel sweaty or light-headed. °· You have a cough with phlegm (sputum) or you cough up blood. °· You are short of breath. °  °This information is not intended to replace advice given to you by your health care provider. Make sure you discuss any questions you have with your health care provider. °  °Document Released: 03/25/2008 Document Revised: 06/28/2015 Document Reviewed: 01/02/2015 °Elsevier Interactive Patient Education ©2016 [employer] ° °
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
## 2016-02-13 ENCOUNTER — Other Ambulatory Visit: Payer: Self-pay

## 2016-02-13 DIAGNOSIS — Z1231 Encounter for screening mammogram for malignant neoplasm of breast: Secondary | ICD-10-CM

## 2016-10-16 ENCOUNTER — Ambulatory Visit (HOSPITAL_COMMUNITY)
Admission: EM | Admit: 2016-10-16 | Discharge: 2016-10-16 | Disposition: A | Discharge status: Home / Self Care | Payer: Medicaid Other | Attending: Internal Medicine | Admitting: Internal Medicine

## 2016-10-16 ENCOUNTER — Encounter (HOSPITAL_COMMUNITY): Payer: Self-pay | Admitting: Emergency Medicine

## 2016-10-16 DIAGNOSIS — L0291 Cutaneous abscess, unspecified: Secondary | ICD-10-CM

## 2016-10-16 DIAGNOSIS — Z760 Encounter for issue of repeat prescription: Secondary | ICD-10-CM

## 2016-10-16 MED ORDER — SULFAMETHOXAZOLE-TRIMETHOPRIM 800-160 MG PO TABS: 1.0000 | ORAL_TABLET | Freq: Two times a day (BID) | ORAL | 0 refills | Status: AC

## 2016-10-16 MED ORDER — ALBUTEROL SULFATE HFA 108 (90 BASE) MCG/ACT IN AERS: 1.0000 | INHALATION_SPRAY | Freq: Four times a day (QID) | RESPIRATORY_TRACT | 0 refills | Status: DC | PRN

## 2016-10-16 NOTE — Discharge Instructions (Signed)
Take your antibiotics as prescribed. Apply warm compresses for 15 minutes at a time 4-5 times a day. Should the wound become red, inflamed, or comes to a head, return to clinic for draining.   Your albuterol inhaler has been refilled and the name of a primary care clinic that accepts your insurance along with new patients has been provided. Follow up with [facility] to establish care and management of your asthma.

## 2016-10-16 NOTE — ED Provider Notes (Signed)
CSN: [id number]     Arrival date & time 10/16/16  [id number] History   First MD Initiated Contact with Patient 10/16/16 1024     Chief Complaint  Patient presents with  . Abscess   (Consider location/radiation/quality/duration/timing/severity/associated sxs/prior Treatment) 50 year old female patient presents to clinic for evaluation of a "knot" on the back of her neck that has been present since Christmas eve. Patient states the area on her neck is tender and painful and she has been having difficulty sleeping due to pain. She reports nothing has drained from the area, it has not been red or warm to the touch. Patient has had no systemic symptoms such as fever or chills.  Secondarily, patient states she is out of her albuterol inhaler and does not currently have a PCP. States she has Hx off asthma and she has not needed her inhaler recently but would like a refill. She has no cough, wheezes, shortness of breath, or other asthma related complaint at this time.   The history is provided by the patient.  Abscess  Associated symptoms: no fever     Past Medical History:  Diagnosis Date  . Manic depression ([place])   . Obesity    History reviewed. No pertinent surgical history. No family history on file. Social History  Substance Use Topics  . Smoking status: Never Smoker  . Smokeless tobacco: Never Used  . Alcohol use No   OB History    No data available     Review of Systems  Constitutional: Negative for chills and fever.  HENT: Negative for ear pain, facial swelling, hearing loss, sinus pain and sinus pressure.   Respiratory: Negative.   Cardiovascular: Negative.   Gastrointestinal: Negative.   Musculoskeletal: Negative.   Skin: Positive for wound (Raised "knot" on her neck).  Neurological: Negative.     Allergies  Patient has no known allergies.  Home Medications   Prior to Admission medications   Medication Sig Start Date End Date Taking? Authorizing Provider    Acetaminophen-Caff-Pyrilamine (MIDOL COMPLETE PO) Take 2 tablets by mouth every 8 (eight) hours as needed (for pain).    Historical Provider, MD  albuterol (PROVENTIL HFA;VENTOLIN HFA) 108 (90 BASE) MCG/ACT inhaler Inhale 1-2 puffs into the lungs every 4 (four) hours as needed for wheezing or shortness of breath. 11/28/13   Liam Graham, PA-C  albuterol (PROVENTIL HFA;VENTOLIN HFA) 108 (90 Base) MCG/ACT inhaler [name] 10/16/16   Barnet Glasgow, NP  cyclobenzaprine (FLEXERIL) 5 MG tablet Take 1 tablet (5 mg total) by mouth 3 (three) times daily as needed for muscle spasms. Take as prescribed. Do NOT take greater or more frequently then prescribed. Do NOT take with other sedating medications or ANY alcohol as this can result in death. This medication can impair coordination and reflexes, and cause drowsiness. Do NOT perform tasks in which this would place you in danger as it can make you a FALL RISK. 08/28/15   Voncille Lo, MD  ferrous sulfate 325 (65 FE) MG tablet Take 1 tablet (325 mg total) by mouth daily. 06/06/14   Elyn Peers, MD  QUEtiapine (SEROQUEL) 200 MG tablet [name]    Historical Provider, MD  sulfamethoxazole-trimethoprim (BACTRIM DS,SEPTRA DS) 800-160 MG tablet Take 1 tablet by mouth 2 (two) times daily. 10/16/16 10/23/16  Barnet Glasgow, NP  traZODone (DESYREL) 100 MG tablet Take 100 mg  by mouth at bedtime.    Historical Provider, MD   Meds Ordered and Administered this Visit  Medications - No data to display  BP 146/95   Pulse 77   Temp 98.6 F (37 C) (Oral)   Resp 16   Ht 5\' 4"  (1.626 m)   Wt 250 lb (113.4 kg)   SpO2 100%   BMI 42.91 kg/m  No data found.   Physical Exam  Constitutional: She is oriented to person, place, and time. She appears well-developed and well-nourished. No distress.  HENT:  Head: Normocephalic.  Right Ear: External ear normal.   Left Ear: External ear normal.  Neck: Normal range of motion.    Cardiovascular: Normal rate and regular rhythm.   Pulmonary/Chest: Effort normal and breath sounds normal.  Abdominal: Soft. Bowel sounds are normal.  Lymphadenopathy:    She has no cervical adenopathy.  Neurological: She is alert and oriented to person, place, and time.  Skin: Skin is warm and dry. Capillary refill takes less than 2 seconds. She is not diaphoretic. No erythema.  Psychiatric: She has a normal mood and affect.  Nursing note and vitals reviewed.   Urgent Care Course   Clinical Course     Procedures (including critical care time)  Labs Review Labs Reviewed - No data to display  Imaging Review No results found.   Visual Acuity Review  Right Eye Distance:   Left Eye Distance:   Bilateral Distance:    Right Eye Near:   Left Eye Near:    Bilateral Near:         MDM   1. Abscess   2. Medication refill    Patient given refill of her albuterol inhaler and provided the name of a clinic that is accepting new patients.  With regard to the abscess, it is currently too early to drain. Patient given an rx for Bactrim DS for 7 days and advised to use warm compresses 4-5 times a day. Should the abscess become reddened or inflamed follow up with PCP or return to clinic for drainage.    Barnet Glasgow, NP 10/16/16 1217

## 2017-05-25 ENCOUNTER — Encounter (HOSPITAL_COMMUNITY): Payer: Self-pay | Admitting: Emergency Medicine

## 2017-05-25 ENCOUNTER — Inpatient Hospital Stay (HOSPITAL_COMMUNITY)
Admission: EM | Admit: 2017-05-25 | Discharge: 2017-05-28 | DRG: 065 | Disposition: A | Discharge status: Home / Self Care | Payer: Medicaid Other | Attending: Internal Medicine | Admitting: Internal Medicine

## 2017-05-25 ENCOUNTER — Emergency Department (HOSPITAL_COMMUNITY): Payer: Medicaid Other

## 2017-05-25 DIAGNOSIS — R29701 NIHSS score 1: Secondary | ICD-10-CM | POA: Diagnosis present

## 2017-05-25 DIAGNOSIS — Z6841 Body Mass Index (BMI) 40.0 and over, adult: Secondary | ICD-10-CM

## 2017-05-25 DIAGNOSIS — R4781 Slurred speech: Secondary | ICD-10-CM | POA: Diagnosis present

## 2017-05-25 DIAGNOSIS — I63519 Cerebral infarction due to unspecified occlusion or stenosis of unspecified middle cerebral artery: Principal | ICD-10-CM | POA: Diagnosis present

## 2017-05-25 DIAGNOSIS — I639 Cerebral infarction, unspecified: Secondary | ICD-10-CM | POA: Diagnosis not present

## 2017-05-25 DIAGNOSIS — R739 Hyperglycemia, unspecified: Secondary | ICD-10-CM | POA: Diagnosis present

## 2017-05-25 DIAGNOSIS — I10 Essential (primary) hypertension: Secondary | ICD-10-CM | POA: Diagnosis present

## 2017-05-25 DIAGNOSIS — R202 Paresthesia of skin: Secondary | ICD-10-CM

## 2017-05-25 DIAGNOSIS — D509 Iron deficiency anemia, unspecified: Secondary | ICD-10-CM | POA: Diagnosis not present

## 2017-05-25 DIAGNOSIS — F40240 Claustrophobia: Secondary | ICD-10-CM | POA: Diagnosis present

## 2017-05-25 DIAGNOSIS — G4700 Insomnia, unspecified: Secondary | ICD-10-CM | POA: Diagnosis present

## 2017-05-25 DIAGNOSIS — Z8249 Family history of ischemic heart disease and other diseases of the circulatory system: Secondary | ICD-10-CM

## 2017-05-25 DIAGNOSIS — G2581 Restless legs syndrome: Secondary | ICD-10-CM | POA: Diagnosis present

## 2017-05-25 DIAGNOSIS — F319 Bipolar disorder, unspecified: Secondary | ICD-10-CM | POA: Diagnosis present

## 2017-05-25 DIAGNOSIS — E669 Obesity, unspecified: Secondary | ICD-10-CM | POA: Diagnosis present

## 2017-05-25 DIAGNOSIS — R200 Anesthesia of skin: Secondary | ICD-10-CM

## 2017-05-25 DIAGNOSIS — I6300 Cerebral infarction due to thrombosis of unspecified precerebral artery: Secondary | ICD-10-CM

## 2017-05-25 DIAGNOSIS — E785 Hyperlipidemia, unspecified: Secondary | ICD-10-CM | POA: Diagnosis present

## 2017-05-25 DIAGNOSIS — G459 Transient cerebral ischemic attack, unspecified: Secondary | ICD-10-CM

## 2017-05-25 DIAGNOSIS — H532 Diplopia: Secondary | ICD-10-CM | POA: Diagnosis present

## 2017-05-25 HISTORY — DX: Restless legs syndrome: G25.81

## 2017-05-25 HISTORY — DX: Essential (primary) hypertension: I10

## 2017-05-25 HISTORY — DX: Insomnia, unspecified: G47.00

## 2017-05-25 LAB — I-STAT CHEM 8, ED
BUN: 9 mg/dL (ref 6–20)
CHLORIDE: 104 mmol/L (ref 101–111)
CREATININE: 0.8 mg/dL (ref 0.44–1.00)
Calcium, Ion: 1.15 mmol/L (ref 1.15–1.40)
GLUCOSE: 112 mg/dL — AB (ref 65–99)
HCT: 29 % — ABNORMAL LOW (ref 36.0–46.0)
Hemoglobin: 9.9 g/dL — ABNORMAL LOW (ref 12.0–15.0)
POTASSIUM: 3.5 mmol/L (ref 3.5–5.1)
Sodium: 141 mmol/L (ref 135–145)
TCO2: 26 mmol/L (ref 0–100)

## 2017-05-25 LAB — PROTIME-INR
INR: 1
Prothrombin Time: 13.1 seconds (ref 11.4–15.2)

## 2017-05-25 LAB — COMPREHENSIVE METABOLIC PANEL
ALBUMIN: 3.2 g/dL — AB (ref 3.5–5.0)
ALK PHOS: 70 U/L (ref 38–126)
ALT: 23 U/L (ref 14–54)
ANION GAP: 7 (ref 5–15)
AST: 26 U/L (ref 15–41)
BUN: 10 mg/dL (ref 6–20)
CALCIUM: 8.8 mg/dL — AB (ref 8.9–10.3)
CHLORIDE: 106 mmol/L (ref 101–111)
CO2: 24 mmol/L (ref 22–32)
Creatinine, Ser: 0.85 mg/dL (ref 0.44–1.00)
GFR calc Af Amer: >60 mL/min (ref >60)
GFR calc non Af Amer: >60 mL/min (ref >60)
GLUCOSE: 112 mg/dL — AB (ref 65–99)
Potassium: 3.4 mmol/L — ABNORMAL LOW (ref 3.5–5.1)
SODIUM: 137 mmol/L (ref 135–145)
Total Bilirubin: 0.1 mg/dL — ABNORMAL LOW (ref 0.3–1.2)
Total Protein: 6.5 g/dL (ref 6.5–8.1)

## 2017-05-25 LAB — DIFFERENTIAL
BASOS ABS: 0 10*3/uL (ref 0.0–0.1)
Basophils Relative: 0 %
Eosinophils Absolute: 0.1 10*3/uL (ref 0.0–0.7)
Eosinophils Relative: 1 %
LYMPHS ABS: 2.2 10*3/uL (ref 0.7–4.0)
LYMPHS PCT: 20 %
MONOS PCT: 5 %
Monocytes Absolute: 0.6 10*3/uL (ref 0.1–1.0)
NEUTROS PCT: 74 %
Neutro Abs: 8.2 10*3/uL — ABNORMAL HIGH (ref 1.7–7.7)

## 2017-05-25 LAB — CBG MONITORING, ED: GLUCOSE-CAPILLARY: 109 mg/dL — AB (ref 65–99)

## 2017-05-25 LAB — CBC
HCT: 27.8 % — ABNORMAL LOW (ref 36.0–46.0)
HEMOGLOBIN: 8.4 g/dL — AB (ref 12.0–15.0)
MCH: 20 pg — ABNORMAL LOW (ref 26.0–34.0)
MCHC: 30.2 g/dL (ref 30.0–36.0)
MCV: 66.3 fL — ABNORMAL LOW (ref 78.0–100.0)
Platelets: 429 10*3/uL — ABNORMAL HIGH (ref 150–400)
RBC: 4.19 MIL/uL (ref 3.87–5.11)
RDW: 19.5 % — ABNORMAL HIGH (ref 11.5–15.5)
WBC: 11.1 10*3/uL — ABNORMAL HIGH (ref 4.0–10.5)

## 2017-05-25 LAB — I-STAT TROPONIN, ED: Troponin i, poc: 0.01 ng/mL (ref 0.00–0.08)

## 2017-05-25 LAB — APTT: APTT: 25 s (ref 24–36)

## 2017-05-25 MED ORDER — ACETAMINOPHEN 650 MG RE SUPP: 650.0000 mg | RECTAL | Status: DC | PRN

## 2017-05-25 MED ORDER — SENNOSIDES-DOCUSATE SODIUM 8.6-50 MG PO TABS: 1.0000 | ORAL_TABLET | Freq: Every evening | ORAL | Status: DC | PRN

## 2017-05-25 MED ORDER — ENOXAPARIN SODIUM 40 MG/0.4ML ~~LOC~~ SOLN
40.0000 mg | SUBCUTANEOUS | Status: DC
  Administered 2017-05-26 – 2017-05-28 (×3): 40 mg via SUBCUTANEOUS
  Filled 2017-05-25 (×3): qty 0.4

## 2017-05-25 MED ORDER — LORAZEPAM 2 MG/ML IJ SOLN
1.0000 mg | Freq: Once | INTRAMUSCULAR | Status: AC
  Administered 2017-05-26: 1 mg via INTRAVENOUS
  Filled 2017-05-25: qty 1

## 2017-05-25 MED ORDER — QUETIAPINE FUMARATE 50 MG PO TABS
300.0000 mg | ORAL_TABLET | Freq: Every day | ORAL | Status: DC
  Administered 2017-05-26 – 2017-05-27 (×2): 300 mg via ORAL
  Filled 2017-05-25 (×2): qty 6

## 2017-05-25 MED ORDER — ASPIRIN 325 MG PO TABS
325.0000 mg | ORAL_TABLET | Freq: Every day | ORAL | Status: DC
  Administered 2017-05-26 – 2017-05-28 (×3): 325 mg via ORAL
  Filled 2017-05-25 (×3): qty 1

## 2017-05-25 MED ORDER — ACETAMINOPHEN 160 MG/5ML PO SOLN: 650.0000 mg | ORAL | Status: DC | PRN

## 2017-05-25 MED ORDER — TRAZODONE HCL 50 MG PO TABS
150.0000 mg | ORAL_TABLET | Freq: Every day | ORAL | Status: DC
  Administered 2017-05-26 – 2017-05-27 (×2): 150 mg via ORAL
  Filled 2017-05-25 (×2): qty 1

## 2017-05-25 MED ORDER — ACETAMINOPHEN 325 MG PO TABS: 650.0000 mg | ORAL_TABLET | ORAL | Status: DC | PRN

## 2017-05-25 MED ORDER — SODIUM CHLORIDE 0.9 % IV SOLN
INTRAVENOUS | Status: DC
  Administered 2017-05-26: 03:00:00 via INTRAVENOUS

## 2017-05-25 MED ORDER — ASPIRIN 300 MG RE SUPP: 300.0000 mg | Freq: Every day | RECTAL | Status: DC

## 2017-05-25 MED ORDER — STROKE: EARLY STAGES OF RECOVERY BOOK
Freq: Once | Status: AC
  Administered 2017-05-25: 23:00:00
  Filled 2017-05-25: qty 1

## 2017-05-25 MED ORDER — ASPIRIN 81 MG PO CHEW
324.0000 mg | CHEWABLE_TABLET | Freq: Once | ORAL | Status: AC
  Administered 2017-05-25: 324 mg via ORAL
  Filled 2017-05-25: qty 4

## 2017-05-25 NOTE — ED Provider Notes (Signed)
[place] DEPT Provider Note   CSN: [id number] Arrival date & time: 05/25/17  1849     History   Chief Complaint Chief Complaint  Patient presents with  . Numbness    HPI Sarah Bean is a 51 y.o. female.  The history is provided by the patient.  Illness  This is a new problem. The current episode started 1 to 2 hours ago. The problem occurs constantly. The problem has not changed since onset.Associated symptoms include headaches. Pertinent negatives include no chest pain, no abdominal pain and no shortness of breath. Nothing aggravates the symptoms. Nothing relieves the symptoms. She has tried nothing for the symptoms. The treatment provided no relief.   51 year old female who has a history of hypertension and obesity who presents with right face and arm numbness. Symptoms started about an hour prior to arrival while she was at rest, sitting on the couch. States that she did have episode of slurred speech, but that is now resolved. He feels subjectively weak on that right side as well. Has had some blurry vision over the past few days. States that yesterday evening while leaving a reunion did have one hour episode of right arm and face numbness as well but fully resolved. No recent illnesses, fevers or chills, nausea or vomiting. Has had an occipital headache that has been on and off over the past few days. No prior history of migraine headaches.   Past Medical History:  Diagnosis Date  . Manic depression ([place])   . Obesity     There are no active problems to display for this patient.   History reviewed. No pertinent surgical history.  OB History    No data available       Home Medications    Prior to Admission medications   Medication Sig Start Date End Date Taking? Authorizing Provider  Acetaminophen-Caff-Pyrilamine (MIDOL COMPLETE PO) Take 2 tablets by mouth every 8 (eight) hours as needed (for pain).    [provider]  albuterol (PROVENTIL  HFA;VENTOLIN HFA) 108 (90 BASE) MCG/ACT inhaler Inhale 1-2 puffs into the lungs every 4 (four) hours as needed for wheezing or shortness of breath. 11/28/13   Liam Graham, PA-C  albuterol (PROVENTIL HFA;VENTOLIN HFA) 108 (90 Base) MCG/ACT inhaler [name] 10/16/16   Barnet Glasgow, NP  cyclobenzaprine (FLEXERIL) 5 MG tablet Take 1 tablet (5 mg total) by mouth 3 (three) times daily as needed for muscle spasms. Take as prescribed. Do NOT take greater or more frequently then prescribed. Do NOT take with other sedating medications or ANY alcohol as this can result in death. This medication can impair coordination and reflexes, and cause drowsiness. Do NOT perform tasks in which this would place you in danger as it can make you a FALL RISK. 08/28/15   Voncille Lo, MD  ferrous sulfate 325 (65 FE) MG tablet Take 1 tablet (325 mg total) by mouth daily. 06/06/14   Elyn Peers, MD  QUEtiapine (SEROQUEL) 200 MG tablet [name]    [provider]  traZODone (DESYREL) 100 MG tablet Take 100 mg by mouth at bedtime.    [provider]    Family History No family history on file.  Social History Social History  Substance Use Topics  . Smoking status: Never Smoker  . Smokeless tobacco: Never Used  . Alcohol use No  Allergies   Patient has no known allergies.   Review of Systems Review of Systems  Respiratory: Negative for shortness of breath.   Cardiovascular: Negative for chest pain.  Gastrointestinal: Negative for abdominal pain.  Neurological: Positive for headaches.  All other systems reviewed and are negative.    Physical Exam Updated Vital Signs BP (!) 160/85   Pulse (!) 102   Temp 98.7 F (37.1 C) (Oral)   Resp 18   Ht 5\' 4"  (1.626 m)   Wt 117.9 kg (260 lb)   LMP 05/25/2017   SpO2 100%   BMI 44.63 kg/m   Physical Exam Physical Exam    Nursing note and vitals reviewed. Constitutional: Well developed, well nourished, non-toxic, and in no acute distress Head: Normocephalic and atraumatic.  Mouth/Throat: Oropharynx is clear and moist.  Neck: Normal range of motion. Neck supple.  Cardiovascular: Normal rate and regular rhythm.   Pulmonary/Chest: Effort normal and breath sounds normal.  Abdominal: Soft. There is no tenderness. There is no rebound and no guarding.  Musculoskeletal: Normal range of motion.  Skin: Skin is warm and dry.  Psychiatric: Cooperative Neurological:  Alert, oriented to person, place, time, and situation. Memory grossly in tact. Fluent speech. No dysarthria or aphasia.  Cranial nerves: VF are full. Pupils are symmetric, and reactive to light. EOMI without nystagmus. No gaze deviation. Facial muscles symmetric with activation. Sensation to light touch over face reported diminished over the right face. Hearing grossly in tact. Palate elevates symmetrically. Head turn and shoulder shrug are intact. Tongue midline.  Reflexes defered.  Muscle bulk and tone normal. No pronator drift. Moves all extremities symmetrically. Sensation to light touch is reported diminished in the right hand and forearm. Sensation to light touch intact in all remainder extremities and the upper arm.. Coordination reveals no dysmetria with finger to nose.     ED Treatments / Results  Labs (all labs ordered are listed, but only abnormal results are displayed) Labs Reviewed  CBC - Abnormal; Notable for the following:       Result Value   WBC 11.1 (*)    Hemoglobin 8.4 (*)    HCT 27.8 (*)    MCV 66.3 (*)    MCH 20.0 (*)    RDW 19.5 (*)    Platelets 429 (*)    All other components within normal limits  COMPREHENSIVE METABOLIC PANEL - Abnormal; Notable for the following:    Potassium 3.4 (*)    Glucose, Bld 112 (*)    Calcium 8.8 (*)    Albumin 3.2 (*)    Total Bilirubin 0.1 (*)    All other components within normal limits   CBG MONITORING, ED - Abnormal; Notable for the following:    Glucose-Capillary 109 (*)    All other components within normal limits  I-STAT CHEM 8, ED - Abnormal; Notable for the following:    Glucose, Bld 112 (*)    Hemoglobin 9.9 (*)    HCT 29.0 (*)    All other components within normal limits  PROTIME-INR  APTT  DIFFERENTIAL  I-STAT TROPONIN, ED    EKG  EKG Interpretation  Date/Time:  Sunday May 25 2017 18:54:03 EDT Ventricular Rate:  99 PR Interval:    QRS Duration: 83 QT Interval:  336 QTC Calculation: 432 R Axis:   83 Text Interpretation:  Sinus rhythm Borderline repolarization abnormality no prior EKG  Confirmed by Brantley Stage [phone]) on 05/25/2017 7:37:42 PM       Radiology Ct  Head Wo Contrast  Result Date: 05/25/2017 CLINICAL DATA:  Right-sided numbness and slurred speech onset yesterday. EXAM: CT HEAD WITHOUT CONTRAST TECHNIQUE: Contiguous axial images were obtained from the base of the skull through the vertex without intravenous contrast. COMPARISON:  11/02/2011 FINDINGS: Brain: Small left occipital lobe nonhemorrhagic infarct, new since 2013 suspicious for a recent acute or subacute infarct. No hydrocephalus, intra-axial mass nor extra-axial collections. No large vascular territory infarcts. Fourth ventricle and basal cisterns are midline without effacement. Vascular: No hyperdense vessel or unexpected calcification. Skull: Negative for fracture or focal lesion. Sinuses/Orbits: No acute finding. Other: None. IMPRESSION: Small left occipital lobe nonhemorrhagic, recent appearing infarct. Electronically Signed   By: Ashley Royalty M.D.   On: 05/25/2017 19:57    Procedures Procedures (including critical care time)  Medications Ordered in ED Medications  aspirin chewable tablet 324 mg (not administered)     Initial Impression / Assessment and Plan / ED Course  I have reviewed the triage vital signs and the nursing notes.  Pertinent labs & imaging results that were  available during my care of the patient were reviewed by me and considered in my medical decision making (see chart for details).     Patient presents with right face and right arm numbness with episode of slurred speech prior to arrival. Discussed with Dr. Nicole Kindred over the phone, who did not recommend calling code stroke given mild deficits and would ikely not be a candidate for TPA. He will come to evaluate patient, but recommended admission for CVA workup.  CT is visualized. Radiology feels there may be acute or subacute stroke in the left occipital region. She is given aspirin. Plan to admit for ongoing stroke work-up.   Final Clinical Impressions(s) / ED Diagnoses   Final diagnoses:  Numbness and tingling of right face  Cerebrovascular accident (CVA), unspecified mechanism ([employer])    New Prescriptions New Prescriptions   No medications on file     Forde Dandy, MD 05/25/17 2013

## 2017-05-25 NOTE — ED Notes (Signed)
Informed patient, and family of room #.

## 2017-05-25 NOTE — ED Notes (Signed)
Admitting MD at bedside.

## 2017-05-25 NOTE — Consult Note (Signed)
Admission H&P    Chief Complaint: Numbness involving right face and upper extremity, and slurred speech.  HPI: Sarah Bean is an 51 y.o. female with a history of hypertension, obesity and bipolar affective disorder presenting with numbness involving right side of her face and right upper extremity of sudden onset. Initial symptoms occurred yesterday and lasted for about 1 hour. Current symptoms started at 6:00 PM today and have persisted. She had slurred speech initially, which has resolved. Numbness has persisted. CT scan of her head showed no acute intracranial abnormality. She has not been on antiplatelet therapy daily. NIH stroke score was 1.  LSN: 1800 on 05/25/2017 tPA Given: No: Minimal deficits with no objective disability. mRankin:  Past Medical History:  Diagnosis Date  . Manic depression ([place])   . Obesity     History reviewed. No pertinent surgical history.  No family history on file. Social History:  reports that she has never smoked. She has never used smokeless tobacco. She reports that she does not drink alcohol or use drugs.  Allergies: No Known Allergies  Medications: Preadmission medications were reviewed by me.  ROS: History obtained from patient and her daughter.  General ROS: negative for - chills, fatigue, fever, night sweats, weight gain or weight loss Psychological ROS: negative for - behavioral disorder, hallucinations, memory difficulties, mood swings or suicidal ideation Ophthalmic ROS: negative for - blurry vision, double vision, eye pain or loss of vision ENT ROS: negative for - epistaxis, nasal discharge, oral lesions, sore throat, tinnitus or vertigo Allergy and Immunology ROS: negative for - hives or itchy/watery eyes Hematological and Lymphatic ROS: negative for - bleeding problems, bruising or swollen lymph nodes Endocrine ROS: negative for - galactorrhea, hair pattern changes, polydipsia/polyuria or temperature intolerance Respiratory ROS:  negative for - cough, hemoptysis, shortness of breath or wheezing Cardiovascular ROS: negative for - chest pain, dyspnea on exertion, edema or irregular heartbeat Gastrointestinal ROS: negative for - abdominal pain, diarrhea, hematemesis, nausea/vomiting or stool incontinence Genito-Urinary ROS: negative for - dysuria, hematuria, incontinence or urinary frequency/urgency Musculoskeletal ROS: negative for - joint swelling or muscular weakness Neurological ROS: as noted in HPI Dermatological ROS: negative for rash and skin lesion changes  Physical Examination: Blood pressure (!) 160/85, pulse 88, temperature 98.7 F (37.1 C), resp. rate 19, height _0  (1.626 m), weight 117.9 kg (260 lb), last menstrual period 05/25/2017, SpO2 99 %.  HEENT-  Normocephalic, no lesions, without obvious abnormality.  Normal external eye and conjunctiva.  Normal TM's bilaterally.  Normal auditory canals and external ears. Normal external nose, mucus membranes and septum.  Normal pharynx. Neck supple with no masses, nodes, nodules or enlargement. Cardiovascular - regular rate and rhythm, S1, S2 normal, no murmur, click, rub or gallop Lungs - chest clear, no wheezing, rales, normal symmetric air entry Abdomen - soft, non-tender; bowel sounds normal; no masses,  no organomegaly Extremities - no joint deformities, effusion, or inflammation  Neurologic Examination: Mental Status: Alert, oriented, thought content appropriate.  Speech fluent without evidence of aphasia. Able to follow commands without difficulty. Cranial Nerves: II-Visual fields were normal. III/IV/VI-Pupils were equal and reacted. Extraocular movements were full and conjugate.    V/VII-reduced perception of tactile sensation over the right side of the face compared to the left; no facial weakness. VIII-normal. X-normal speech and symmetrical palatal movement. XI: trapezius strength/neck flexion strength normal bilaterally XII-midline tongue  extension with normal strength. Motor: 5/5 bilaterally with normal tone and bulk Sensory: Reduced perception of tactile sensation over  the right upper extremity compared to left upper extremity; no lower extremity numbness. Deep Tendon Reflexes: 1+ and symmetric. Plantars: Flexor bilaterally Cerebellar: Normal finger-to-nose testing. Carotid auscultation: Normal  Results for orders placed or performed during the hospital encounter of 05/25/17 (from the past 48 hour(s))  CBG monitoring, ED     Status: Abnormal   Collection Time: 05/25/17  7:13 PM  Result Value Ref Range   Glucose-Capillary 109 (H) 65 - 99 mg/dL   Comment 1 Notify RN    Comment 2 Document in Chart   Protime-INR     Status: None   Collection Time: 05/25/17  7:14 PM  Result Value Ref Range   Prothrombin Time 13.1 11.4 - 15.2 seconds   INR 1.00   APTT     Status: None   Collection Time: 05/25/17  7:14 PM  Result Value Ref Range   aPTT 25 24 - 36 seconds  CBC     Status: Abnormal   Collection Time: 05/25/17  7:14 PM  Result Value Ref Range   WBC 11.1 (H) 4.0 - 10.5 K/uL   RBC 4.19 3.87 - 5.11 MIL/uL   Hemoglobin 8.4 (L) 12.0 - 15.0 g/dL   HCT 27.8 (L) 36.0 - 46.0 %   MCV 66.3 (L) 78.0 - 100.0 fL   MCH 20.0 (L) 26.0 - 34.0 pg   MCHC 30.2 30.0 - 36.0 g/dL   RDW 19.5 (H) 11.5 - 15.5 %   Platelets 429 (H) 150 - 400 K/uL  Differential     Status: Abnormal   Collection Time: 05/25/17  7:14 PM  Result Value Ref Range   Neutrophils Relative % 74 %   Lymphocytes Relative 20 %   Monocytes Relative 5 %   Eosinophils Relative 1 %   Basophils Relative 0 %   Neutro Abs 8.2 (H) 1.7 - 7.7 K/uL   Lymphs Abs 2.2 0.7 - 4.0 K/uL   Monocytes Absolute 0.6 0.1 - 1.0 K/uL   Eosinophils Absolute 0.1 0.0 - 0.7 K/uL   Basophils Absolute 0.0 0.0 - 0.1 K/uL   RBC Morphology POLYCHROMASIA PRESENT     Comment: ELLIPTOCYTES TEARDROP CELLS TARGET CELLS   Comprehensive metabolic panel     Status: Abnormal   Collection Time: 05/25/17   7:14 PM  Result Value Ref Range   Sodium 137 135 - 145 mmol/L   Potassium 3.4 (L) 3.5 - 5.1 mmol/L   Chloride 106 101 - 111 mmol/L   CO2 24 22 - 32 mmol/L   Glucose, Bld 112 (H) 65 - 99 mg/dL   BUN 10 6 - 20 mg/dL   Creatinine, Ser 0.85 0.44 - 1.00 mg/dL   Calcium 8.8 (L) 8.9 - 10.3 mg/dL   Total Protein 6.5 6.5 - 8.1 g/dL   Albumin 3.2 (L) 3.5 - 5.0 g/dL   AST 26 15 - 41 U/L   ALT 23 14 - 54 U/L   Alkaline Phosphatase 70 38 - 126 U/L   Total Bilirubin 0.1 (L) 0.3 - 1.2 mg/dL   GFR calc non Af Amer >60 >60 mL/min   GFR calc Af Amer >60 >60 mL/min    Comment: (NOTE) The eGFR has been calculated using the CKD EPI equation. This calculation has not been validated in all clinical situations. eGFR's persistently <60 mL/min signify possible Chronic Kidney Disease.    Anion gap 7 5 - 15  I-stat troponin, ED     Status: None   Collection Time: 05/25/17  7:19 PM  Result Value Ref Range   Troponin i, poc 0.01 0.00 - 0.08 ng/mL   Comment 3            Comment: Due to the release kinetics of cTnI, a negative result within the first hours of the onset of symptoms does not rule out myocardial infarction with certainty. If myocardial infarction is still suspected, repeat the test at appropriate intervals.   I-Stat Chem 8, ED     Status: Abnormal   Collection Time: 05/25/17  7:21 PM  Result Value Ref Range   Sodium 141 135 - 145 mmol/L   Potassium 3.5 3.5 - 5.1 mmol/L   Chloride 104 101 - 111 mmol/L   BUN 9 6 - 20 mg/dL   Creatinine, Ser 0.80 0.44 - 1.00 mg/dL   Glucose, Bld 112 (H) 65 - 99 mg/dL   Calcium, Ion 1.15 1.15 - 1.40 mmol/L   TCO2 26 0 - 100 mmol/L   Hemoglobin 9.9 (L) 12.0 - 15.0 g/dL   HCT 29.0 (L) 36.0 - 46.0 %   Ct Head Wo Contrast  Result Date: 05/25/2017 CLINICAL DATA:  Right-sided numbness and slurred speech onset yesterday. EXAM: CT HEAD WITHOUT CONTRAST TECHNIQUE: Contiguous axial images were obtained from the base of the skull through the vertex without  intravenous contrast. COMPARISON:  11/02/2011 FINDINGS: Brain: Small left occipital lobe nonhemorrhagic infarct, new since 2013 suspicious for a recent acute or subacute infarct. No hydrocephalus, intra-axial mass nor extra-axial collections. No large vascular territory infarcts. Fourth ventricle and basal cisterns are midline without effacement. Vascular: No hyperdense vessel or unexpected calcification. Skull: Negative for fracture or focal lesion. Sinuses/Orbits: No acute finding. Other: None. IMPRESSION: Small left occipital lobe nonhemorrhagic, recent appearing infarct. Electronically Signed   By: Ashley Royalty M.D.   On: 05/25/2017 19:57    Assessment: 50 y.o. female presenting with probable left subcortical TIA. A small vessel subcortical MCA territory ischemic stroke cannot be ruled out at this point, however.  Stroke Risk Factors - hypertension  Plan: 1. HgbA1c, fasting lipid panel 2. MRI, MRA  of the brain without contrast 3. PT consult, OT consult, Speech consult 4. Echocardiogram 5. Carotid dopplers 6. Prophylactic therapy-Antiplatelet med: Aspirin  7. Hypercoagulopathy panel 8. Telemetry monitoring  C.R. Nicole Kindred, MD [facility] [phone]  05/25/2017, 8:29 PM

## 2017-05-25 NOTE — ED Triage Notes (Signed)
Per [facility]: Pt to ED from home c/o recurrent stroke-like symptoms - numbness and tingling in R side, pt endorses slurred speech as well. Pt states she had same symptoms in addition to headache last night. Pt states the symptoms lasted about an hour and then resolved until 1800 today. Pt A&O x 4, pt voice sounds hoarse, but words are clear. EMS VS: 182/110, HR 96 NSR, 97% RA, CBG 102. Pt denies pain at this time. PERRL, grip strength equal and strong.

## 2017-05-25 NOTE — H&P (Signed)
History and Physical    Sarah Bean XBM:[id number] DOB: 1966/09/07 DOA: 05/25/2017  PCP: [facility] Consultants:  Fransico Michael Patient coming from: Home - lives with son; Donald Prose: Daughter, [phone], [phone]  Chief Complaint: facial and arm numbness  HPI: Sarah Bean is a 51 y.o. female with medical history significant of restless legs; obesity; bipolar DO; insomnia; and HTN presenting with neurologic symptoms concerning for TIA/CVA.  Yesterday she was at a family reunion when she developed right-sided face and hand numbness.  When she looked at people it looked like the room was spinning and she had diplopia.  The symptoms improved some but recurred.  Today about 6pm, once again her face started getting very numb.  Stood up and she almost fell twice. She felt dizzy and then her legs got weak and so she sat down.  She called her daughter and couldn't talk because of slurred speech.  Daughter told her if she was slurring it could be a stroke and so she came in.  No vision changes today, but blurry vision yesterday.  Posterior headache.  No dysphagia.   ED Course: Consult to neurology but no code stroke given mild deficits and not a tPA candidate.  CT concerning for acute or subacute CVA in left occipital region.  Given ASA.  Review of Systems: As per HPI; otherwise review of systems reviewed and negative.   Ambulatory Status:  Ambulates without assistance  Past Medical History:  Diagnosis Date  . Essential hypertension   . Essential hypertension 05/26/2017  . Insomnia   . Manic depression ([place])   . Obesity   . Restless leg syndrome     Past Surgical History:  Procedure Laterality Date  . BREAST LUMPECTOMY Left   . CESAREAN SECTION     x3    Social History   Social History  . Marital status: Single    Spouse name: N/A  . Number of children: N/A  . Years of education: N/A   Occupational History  . disabled    Social History Main  Topics  . Smoking status: Never Smoker  . Smokeless tobacco: Never Used  . Alcohol use No  . Drug use: No  . Sexual activity: Not on file   Other Topics Concern  . Not on file   Social History Narrative   Patient lives at home with her son, who has autism. He vocalizes and acts out.    No Known Allergies  Family History  Problem Relation Age of Onset  . CAD Mother 51  . Other Father 44       struck by lightning  . Stroke Neg Hx     Prior to Admission medications   Medication Sig Start Date End Date Taking? Authorizing Provider  Acetaminophen-Caff-Pyrilamine (MIDOL COMPLETE PO) Take 2 tablets by mouth every 8 (eight) hours as needed (for pain).   Yes [provider]  cyclobenzaprine (FLEXERIL) 5 MG tablet Take 1 tablet (5 mg total) by mouth 3 (three) times daily as needed for muscle spasms. Take as prescribed. Do NOT take greater or more frequently then prescribed. Do NOT take with other sedating medications or ANY alcohol as this can result in death. This medication can impair coordination and reflexes, and cause drowsiness. Do NOT perform tasks in which this would place you in danger as it can make you a FALL RISK. 08/28/15  Yes Voncille Lo, MD  QUEtiapine (SEROQUEL) 300 MG tablet Take 300 mg by mouth  at bedtime.    Yes [provider]  traZODone (DESYREL) 150 MG tablet Take 150 mg by mouth at bedtime.    Yes [provider]  albuterol (PROVENTIL HFA;VENTOLIN HFA) 108 (90 BASE) MCG/ACT inhaler Inhale 1-2 puffs into the lungs every 4 (four) hours as needed for wheezing or shortness of breath. 11/28/13   Liam Graham, PA-C  albuterol (PROVENTIL HFA;VENTOLIN HFA) 108 (90 Base) MCG/ACT inhaler [name] Patient not taking: Reported on 05/25/2017 10/16/16   Barnet Glasgow, NP  ferrous sulfate 325 (65 FE) MG tablet Take 1 tablet (325 mg total) by mouth daily. Patient  not taking: Reported on 05/25/2017 06/06/14   Elyn Peers, MD    Physical Exam: Vitals:   05/25/17 2207 05/25/17 2230 05/25/17 2245 05/26/17 0130  BP: (!) 156/93 (!) 149/84 (!) 149/69 (!) 146/77  Pulse: 95 82 81 86  Resp: (!) 22 16 16 18   Temp:   98.2 F (36.8 C) 97.9 F (36.6 C)  TempSrc:   Oral Oral  SpO2: 99% 99% 96% 97%  Weight:      Height:         General:  Appears calm and comfortable and is NAD Eyes:  PERRL, EOMI, normal lids, iris ENT:  grossly normal hearing, lips & tongue, mmm Neck:  no LAD, masses or thyromegaly.  No carotid bruits appreciated. Cardiovascular:  RRR, no m/r/g. No LE edema.  Respiratory:  CTA bilaterally, no w/r/r. Normal respiratory effort. Abdomen:  soft, ntnd, NABS Skin:  no rash or induration seen on limited exam Musculoskeletal:  grossly normal tone BUE/BLE, good ROM, no bony abnormality Psychiatric:  grossly normal mood and affect, speech fluent and appropriate, AOx3 Neurologic:  CN 2-12 grossly intact, moves all extremities in coordinated fashion, sensation impaired along right face in V2/3 distribution but otherwise intact.  Labs on Admission: I have personally reviewed following labs and imaging studies  CBC:  Recent Labs Lab 05/25/17 1914 05/25/17 1921  WBC 11.1*  --   NEUTROABS 8.2*  --   HGB 8.4* 9.9*  HCT 27.8* 29.0*  MCV 66.3*  --   PLT 429*  --    Basic Metabolic Panel:  Recent Labs Lab 05/25/17 1914 05/25/17 1921  NA 137 141  K 3.4* 3.5  CL 106 104  CO2 24  --   GLUCOSE 112* 112*  BUN 10 9  CREATININE 0.85 0.80  CALCIUM 8.8*  --    GFR: Estimated Creatinine Clearance: 107.4 mL/min (by C-G formula based on SCr of 0.8 mg/dL). Liver Function Tests:  Recent Labs Lab 05/25/17 1914  AST 26  ALT 23  ALKPHOS 70  BILITOT 0.1*  PROT 6.5  ALBUMIN 3.2*   No results for input(s): LIPASE, AMYLASE in the last 168 hours. No results for input(s): AMMONIA in the last 168 hours. Coagulation Profile:  Recent Labs Lab  05/25/17 1914  INR 1.00   Cardiac Enzymes:  Recent Labs Lab 05/25/17 2355  TROPONINI <0.03   BNP (last 3 results) No results for input(s): PROBNP in the last 8760 hours. HbA1C: No results for input(s): HGBA1C in the last 72 hours. CBG:  Recent Labs Lab 05/25/17 1913  GLUCAP 109*   Lipid Profile: No results for input(s): CHOL, HDL, LDLCALC, TRIG, CHOLHDL, LDLDIRECT in the last 72 hours. Thyroid Function Tests: No results for input(s): TSH, T4TOTAL, FREET4, T3FREE, THYROIDAB in the last 72 hours. Anemia Panel:  No results for input(s): VITAMINB12, FOLATE, FERRITIN, TIBC, IRON, RETICCTPCT in the last 72 hours. Urine analysis: No results found for: COLORURINE, APPEARANCEUR, LABSPEC, PHURINE, GLUCOSEU, HGBUR, BILIRUBINUR, KETONESUR, PROTEINUR, UROBILINOGEN, NITRITE, LEUKOCYTESUR  Creatinine Clearance: Estimated Creatinine Clearance: 107.4 mL/min (by C-G formula based on SCr of 0.8 mg/dL).  Sepsis Labs: @LABRCNTIP (procalcitonin:4,lacticidven:4) )No results found for this or any previous visit (from the past 240 hour(s)).   Radiological Exams on Admission: Ct Head Wo Contrast  Result Date: 05/25/2017 CLINICAL DATA:  Right-sided numbness and slurred speech onset yesterday. EXAM: CT HEAD WITHOUT CONTRAST TECHNIQUE: Contiguous axial images were obtained from the base of the skull through the vertex without intravenous contrast. COMPARISON:  11/02/2011 FINDINGS: Brain: Small left occipital lobe nonhemorrhagic infarct, new since 2013 suspicious for a recent acute or subacute infarct. No hydrocephalus, intra-axial mass nor extra-axial collections. No large vascular territory infarcts. Fourth ventricle and basal cisterns are midline without effacement. Vascular: No hyperdense vessel or unexpected calcification. Skull: Negative for fracture or focal lesion. Sinuses/Orbits: No acute finding. Other: None. IMPRESSION: Small left occipital lobe nonhemorrhagic, recent appearing infarct.  Electronically Signed   By: Ashley Royalty M.D.   On: 05/25/2017 19:57    EKG: Independently reviewed.  NSR with rate 99;  no evidence of acute ischemia  Assessment/Plan Principal Problem:   CVA (cerebral vascular accident) [facility]) Active Problems:   Essential hypertension   Hyperglycemia   Microcytic anemia   CVA -Patient presenting with ?24 hours of right facial numbness, RUE numbness, and transient speech disturbance concerning for TIA/CVA -CT is concerning for "recent" left occipital CVA - likely correlating to time of symptom onset on the day prior to admission -Will place in observation status for CVA/TIA evaluation -Telemetry monitoring -MRI/MRA -Carotid dopplers -Echo -Risk stratification with FLP, A1c; will also check TSH and UDS -ASA daily (has not been taking so has not failed primary prevention) -PT/OT/ST/Nutrition Consults -Currently with mild to minimal deficits, appears unlikely to need placement -Will order hypercoagulable panel given the patient's young age at the onset of initial CVA  HTN -Allow permissive HTN -Treat BP only if >220/120, and then with goal of 15% reduction -She has not previously been prescribed medication for this issue, it appears; she will need this at this time of discharge  Hyperglycemia -Glucose 112 -Check A1c -Otherwise follow for now without intervention  Anemia -Hgb 8.4, prior 8.9 in 8/15; MCV 66.3; RDW 19.5 -MCV is <80 and so this is microcytic anemia -MCV is < 70 indicating probable iron deficiency -With an MCV <70, elevated RDW, and reason for iron deficiency - iron deficiency can be presumed -Suggest addition of FeSO4 at the time of discharge  DVT prophylaxis: Lovenox  Code Status:  Full - confirmed with patient/family Family Communication: Son and daughters present during evaluation  Disposition Plan:  Home once clinically improved Consults called: Neurology; PT/OT/ST/Nutrition  Admission status: It is my clinical opinion  that referral for OBSERVATION is reasonable and necessary in this patient based on the above information provided. The aforementioned taken together are felt to place the patient at high risk for further clinical deterioration. However it is anticipated that the patient may be medically stable for discharge from the hospital within 24 to 48 hours.    Karmen Bongo MD [facility]  If 7PM-7AM, please contact night-coverage [email or web address] Password [email or web address]  05/26/2017, 2:26 AM

## 2017-05-25 NOTE — ED Notes (Signed)
Patient transported to CT 

## 2017-05-26 ENCOUNTER — Observation Stay (HOSPITAL_COMMUNITY): Payer: Medicaid Other

## 2017-05-26 ENCOUNTER — Observation Stay (HOSPITAL_BASED_OUTPATIENT_CLINIC_OR_DEPARTMENT_OTHER): Payer: Medicaid Other

## 2017-05-26 ENCOUNTER — Inpatient Hospital Stay (HOSPITAL_COMMUNITY): Payer: Medicaid Other

## 2017-05-26 ENCOUNTER — Encounter (HOSPITAL_COMMUNITY): Payer: Self-pay | Admitting: Internal Medicine

## 2017-05-26 DIAGNOSIS — I361 Nonrheumatic tricuspid (valve) insufficiency: Secondary | ICD-10-CM | POA: Diagnosis not present

## 2017-05-26 DIAGNOSIS — R202 Paresthesia of skin: Secondary | ICD-10-CM | POA: Diagnosis not present

## 2017-05-26 DIAGNOSIS — Z8249 Family history of ischemic heart disease and other diseases of the circulatory system: Secondary | ICD-10-CM | POA: Diagnosis not present

## 2017-05-26 DIAGNOSIS — R739 Hyperglycemia, unspecified: Secondary | ICD-10-CM | POA: Diagnosis present

## 2017-05-26 DIAGNOSIS — F40240 Claustrophobia: Secondary | ICD-10-CM | POA: Diagnosis present

## 2017-05-26 DIAGNOSIS — I10 Essential (primary) hypertension: Secondary | ICD-10-CM | POA: Diagnosis not present

## 2017-05-26 DIAGNOSIS — E669 Obesity, unspecified: Secondary | ICD-10-CM | POA: Diagnosis present

## 2017-05-26 DIAGNOSIS — H532 Diplopia: Secondary | ICD-10-CM | POA: Diagnosis present

## 2017-05-26 DIAGNOSIS — D509 Iron deficiency anemia, unspecified: Secondary | ICD-10-CM | POA: Diagnosis present

## 2017-05-26 DIAGNOSIS — I6300 Cerebral infarction due to thrombosis of unspecified precerebral artery: Secondary | ICD-10-CM

## 2017-05-26 DIAGNOSIS — Z6841 Body Mass Index (BMI) 40.0 and over, adult: Secondary | ICD-10-CM | POA: Diagnosis not present

## 2017-05-26 DIAGNOSIS — G2581 Restless legs syndrome: Secondary | ICD-10-CM | POA: Diagnosis present

## 2017-05-26 DIAGNOSIS — I639 Cerebral infarction, unspecified: Secondary | ICD-10-CM | POA: Diagnosis not present

## 2017-05-26 DIAGNOSIS — R200 Anesthesia of skin: Secondary | ICD-10-CM | POA: Diagnosis not present

## 2017-05-26 DIAGNOSIS — F319 Bipolar disorder, unspecified: Secondary | ICD-10-CM | POA: Diagnosis present

## 2017-05-26 DIAGNOSIS — R4781 Slurred speech: Secondary | ICD-10-CM | POA: Diagnosis present

## 2017-05-26 DIAGNOSIS — I63519 Cerebral infarction due to unspecified occlusion or stenosis of unspecified middle cerebral artery: Secondary | ICD-10-CM | POA: Diagnosis present

## 2017-05-26 DIAGNOSIS — G4700 Insomnia, unspecified: Secondary | ICD-10-CM | POA: Diagnosis present

## 2017-05-26 DIAGNOSIS — E785 Hyperlipidemia, unspecified: Secondary | ICD-10-CM | POA: Diagnosis present

## 2017-05-26 DIAGNOSIS — R29701 NIHSS score 1: Secondary | ICD-10-CM | POA: Diagnosis present

## 2017-05-26 HISTORY — DX: Essential (primary) hypertension: I10

## 2017-05-26 LAB — LIPID PANEL
Cholesterol: 135 mg/dL (ref 0–200)
HDL: 54 mg/dL (ref >40)
LDL CALC: 75 mg/dL (ref 0–99)
TRIGLYCERIDES: 29 mg/dL (ref <150)
Total CHOL/HDL Ratio: 2.5 RATIO
VLDL: 6 mg/dL (ref 0–40)

## 2017-05-26 LAB — VAS US CAROTID
LCCADDIAS: -18 cm/s
LCCADSYS: -72 cm/s
LCCAPSYS: 93 cm/s
LEFT ECA DIAS: -13 cm/s
LEFT VERTEBRAL DIAS: -21 cm/s
LICADDIAS: -47 cm/s
LICADSYS: -106 cm/s
Left CCA prox dias: 14 cm/s
Left ICA prox dias: -28 cm/s
Left ICA prox sys: -88 cm/s
RIGHT ECA DIAS: -18 cm/s
RIGHT VERTEBRAL DIAS: -12 cm/s
Right CCA prox dias: 33 cm/s
Right CCA prox sys: 182 cm/s
Right cca dist sys: -79 cm/s

## 2017-05-26 LAB — ECHOCARDIOGRAM COMPLETE
Height: 64 in
Weight: 4160 oz

## 2017-05-26 LAB — RAPID URINE DRUG SCREEN, HOSP PERFORMED
Amphetamines: NOT DETECTED
BENZODIAZEPINES: POSITIVE — AB
Barbiturates: NOT DETECTED
Cocaine: NOT DETECTED
OPIATES: NOT DETECTED
Tetrahydrocannabinol: NOT DETECTED

## 2017-05-26 LAB — HIV ANTIBODY (ROUTINE TESTING W REFLEX): HIV Screen 4th Generation wRfx: NONREACTIVE

## 2017-05-26 LAB — TROPONIN I
Troponin I: <0.03 ng/mL (ref <0.03)
Troponin I: <0.03 ng/mL (ref <0.03)

## 2017-05-26 LAB — ANTITHROMBIN III: ANTITHROMB III FUNC: 87 % (ref 75–120)

## 2017-05-26 LAB — TSH: TSH: 3.191 u[IU]/mL (ref 0.350–4.500)

## 2017-05-26 MED ORDER — HALOPERIDOL LACTATE 5 MG/ML IJ SOLN
2.0000 mg | Freq: Four times a day (QID) | INTRAMUSCULAR | Status: DC | PRN
  Administered 2017-05-26 – 2017-05-27 (×2): 2 mg via INTRAVENOUS
  Filled 2017-05-26 (×2): qty 1

## 2017-05-26 MED ORDER — ATORVASTATIN CALCIUM 40 MG PO TABS
40.0000 mg | ORAL_TABLET | Freq: Every day | ORAL | Status: DC
  Administered 2017-05-26 – 2017-05-28 (×3): 40 mg via ORAL
  Filled 2017-05-26 (×3): qty 1

## 2017-05-26 MED ORDER — ROPINIROLE HCL 0.25 MG PO TABS
0.2500 mg | ORAL_TABLET | Freq: Every day | ORAL | Status: DC
  Administered 2017-05-26 – 2017-05-27 (×2): 0.25 mg via ORAL
  Filled 2017-05-26 (×3): qty 1

## 2017-05-26 MED ORDER — LORAZEPAM 2 MG/ML IJ SOLN: 1.0000 mg | Freq: Once | INTRAMUSCULAR | Status: DC | PRN

## 2017-05-26 MED ORDER — LORAZEPAM 2 MG/ML IJ SOLN
2.0000 mg | Freq: Once | INTRAMUSCULAR | Status: AC | PRN
  Administered 2017-05-26: 2 mg via INTRAVENOUS
  Filled 2017-05-26: qty 1

## 2017-05-26 NOTE — Progress Notes (Signed)
Nutrition Consult/Brief Note  Patient identified on the Malnutrition Screening Tool (MST) Report.  Wt Readings from Last 15 Encounters:  05/25/17 260 lb (117.9 kg)  10/16/16 250 lb (113.4 kg)  06/05/14 260 lb (117.9 kg)   Body mass index is 44.63 kg/m. Patient meets criteria for Obesity Class III based on current BMI.   Current diet order is Heart Healthy, patient is consuming approximately 100% of meals at this time. Labs and medications reviewed.   No nutrition interventions warranted at this time. If nutrition issues arise, please consult RD.   Arthur Holms, RD, LDN Pager #: [phone] After-Hours Pager #: [phone]

## 2017-05-26 NOTE — Progress Notes (Signed)
OT Cancellation Note  Patient Details Name: Sarah Bean MRN: [id number] DOB: Apr 15, 1966   Cancelled Treatment:    Reason Eval/Treat Not Completed: Patient at procedure or test/ unavailable. Pt at vascular lab.   [place], OT/L  [phone] 05/26/2017 05/26/2017, 11:48 AM

## 2017-05-26 NOTE — Progress Notes (Signed)
Pt arrived to unit from ED. Walked to bed from stretcher. Welcomed to unit. No c/o of pain. Will continue to monitor.

## 2017-05-26 NOTE — Progress Notes (Signed)
STROKE TEAM PROGRESS NOTE   HISTORY OF PRESENT ILLNESS (per record)  HPI: Sarah Bean is an 51 y.o. female with a history of hypertension, obesity and bipolar affective disorder presenting with numbness involving right side of her face and right upper extremity of sudden onset. Initial symptoms occurred yesterday and lasted for about 1 hour. Current symptoms started at 6:00 PM on 05/25/2017 and have persisted. She had slurred speech on 05/25/2017, which has resolved. Numbness has persisted.  The patient also reports vertigo that began about one week ago.  CT scan of her head showed no acute intracranial abnormality. She has not been on antiplatelet therapy daily. NIH stroke score was 1.  LSN: 1800 on 05/25/2017  Patient was not administered IV t-PA secondary to low NIHSS/minimal deficits on arrival. She was admitted   for further evaluation and treatment.   SUBJECTIVE (INTERVAL HISTORY) Her husband is at the bedside.  The patient is awake, alert, and follows all commands appropriately.   OBJECTIVE Temp:  [97.8 F (36.6 C)-98.7 F (37.1 C)] 98 F (36.7 C) (08/06 0647) Pulse Rate:  [66-102] 70 (08/06 0647) Cardiac Rhythm: Normal sinus rhythm (08/05 2302) Resp:  [14-22] 16 (08/06 0647) BP: (134-165)/(69-93) 165/93 (08/06 0647) SpO2:  [96 %-100 %] 97 % (08/06 0647) Weight:  [117.9 kg (260 lb)] 117.9 kg (260 lb) (08/05 1853)  CBC:  Recent Labs Lab 05/25/17 1914 05/25/17 1921  WBC 11.1*  --   NEUTROABS 8.2*  --   HGB 8.4* 9.9*  HCT 27.8* 29.0*  MCV 66.3*  --   PLT 429*  --     Basic Metabolic Panel:  Recent Labs Lab 05/25/17 1914 05/25/17 1921  NA 137 141  K 3.4* 3.5  CL 106 104  CO2 24  --   GLUCOSE 112* 112*  BUN 10 9  CREATININE 0.85 0.80  CALCIUM 8.8*  --     Lipid Panel:    Component Value Date/Time   CHOL 135 05/26/2017 0319   TRIG 29 05/26/2017 0319   HDL 54 05/26/2017 0319   CHOLHDL 2.5 05/26/2017 0319   VLDL 6 05/26/2017 0319   LDLCALC 75 05/26/2017  0319   HgbA1c: No results found for: HGBA1C Urine Drug Screen: No results found for: LABOPIA, COCAINSCRNUR, LABBENZ, AMPHETMU, THCU, LABBARB  Alcohol Level No results found for: [facility]  IMAGING  Dg Chest 2 View 05/26/2017 IMPRESSION: Mild peribronchial thickening noted.  Lungs otherwise grossly clear.  Ct Head Wo Contrast 05/25/2017 IMPRESSION: Small left occipital lobe nonhemorrhagic, recent appearing infarct.  TTE 05/26/2017  pending  Carotid US 05/26/2017 Summary: - The vertebral arteries appear patent with antegrade flow. - Findings consistent with a 1- 64 percent stenosis involving the right internal carotid artery and the left internal carotid artery.     PHYSICAL EXAM Obese middle aged african american lady not in distress. . Afebrile. Head is nontraumatic. Neck is supple without bruit.    Cardiac exam no murmur or gallop. Lungs are clear to auscultation. Distal pulses are well felt. Neurological Exam ;  Awake  Alert oriented x 3. Normal speech and language.eye movements full without nystagmus.fundi were not visualized. Vision acuity and fields appear normal. Hearing is normal. Palatal movements are normal. Face symmetric. Tongue midline. Normal strength, tone, reflexes and coordination. Normal sensation. Gait deferred.  ASSESSMENT/PLAN [name] is a 51 y.o. female with history of hypertension, obesity and bipolar affective disorder presenting with numbness involving right side of her face and right upper extremity of  sudden onset. She did not receive IV t-PA due to low NIHSS/minimal deficits on arrival.   Suspect small left subcortical infarct  Resultant  Mild right sided numbness  CT head: Small left occipital lobe nonhemorrhagic, recent appearing infarct.  MRI head:   ordered  MRA head:  ordered  Carotid Doppler: B ICA 1-39% stenosis, VAs antegrade  2D Echo   pending   LDL 75  HgbA1c pending   Lovenox 40 mg sq daily for VTE prophylaxis  Diet Heart  Room service appropriate? Yes; Fluid consistency: Thin  No antithrombotic prior to admission, now on aspirin 325 mg daily  Patient counseled to be compliant with her antithrombotic medications  Ongoing aggressive stroke risk factor management  Therapy recommendations:   pending  Disposition:   pending  Hypertension  Stable  Permissive hypertension (OK if < 220/120) but gradually normalize in 5-7 days  Long-term BP goal normotensive  Hyperlipidemia  Home meds:  none  LDL 74, goal < 70  Add atorvastatin 40mg  PO daily  Continue statin at discharge  Diabetes  HgbA1c  pending, goal < 7.0  Controlled  Other Stroke Risk Factors  Obesity, Body mass index is 44.63 kg/m., recommend weight loss, diet and exercise as appropriate  Other Active Problems  Iron deficiency anemia  Hospital day # 0 I have personally examined this patient, reviewed notes, independently viewed imaging studies, participated in medical decision making and plan of care.ROS completed by me personally and pertinent positives fully documented  I have made any additions or clarifications directly to the above note. She has presented with subjective numbness possibly from a small subcortical infarct. She could not tolerate MRI but is willing to go back and try with sedation. I had a long discussion the patient and her boyfriend and answered questions about stroke evaluation and treatment. Greater than 50% time during this 35 minutes visit was spent on counseling and coordination of care about stroke prevention and treatment discussion and answering questions  Antony Contras, MD Medical Director [place] Pager: [phone] 05/26/2017 5:40 PM   To contact Stroke Continuity provider, please refer to [email or web address] After hours, contact General Neurology

## 2017-05-26 NOTE — Progress Notes (Signed)
PT Cancellation Note  Patient Details Name: Sarah Bean MRN: [id number] DOB: 1966-08-14   Cancelled Treatment:    Reason Eval/Treat Not Completed: Patient at procedure or test/unavailable Pt out of room for testing per RN. Will follow up as schedule allows.   Leighton Ruff, PT, DPT  Acute Rehabilitation Services  Pager: [phone]    Rudean Hitt 05/26/2017, 11:48 AM

## 2017-05-26 NOTE — Progress Notes (Signed)
  Echocardiogram 2D Echocardiogram has been performed.  Chandra Asher T Darriana Deboy 05/26/2017, 3:47 PM

## 2017-05-26 NOTE — Progress Notes (Signed)
Pt report tingling to there face and RUE returned but pt able to move it with +4 strength. MD notified, no new orders. Will continue to monitor. Delia Heady RN

## 2017-05-26 NOTE — Progress Notes (Signed)
Pt report the tingling to face and RUE has resolved. Will continue to monitor closely. Delia Heady RN

## 2017-05-26 NOTE — Progress Notes (Signed)
Pt transported to MRI, was put in MRI machine, then refused to go forward with test. Gave Ativan 1 mg IV prior. Pt returned to unit stating, "It was too small".

## 2017-05-26 NOTE — Progress Notes (Signed)
*  PRELIMINARY RESULTS* Vascular Ultrasound Carotid Duplex (Doppler) has been completed.  Preliminary findings: Bilateral 1-39% ICA stenosis, antegrade vertebral flow.   Everrett Coombe 05/26/2017, 12:08 PM

## 2017-05-26 NOTE — Care Management Note (Signed)
Case Management Note  Patient Details  Name: Sarah Bean MRN: [id number] Date of Birth: 24-May-1966  Subjective/Objective:    Pt in to r/o CVA. She is from home with her son.                 Action/Plan: Pt unable to tolerate MRI after IV ativan. Awaiting PT/OT recommendations. CM following for d/c needs, physician orders.   Expected Discharge Date:                  Expected Discharge Plan:     In-House Referral:     Discharge planning Services     Post Acute Care Choice:    Choice offered to:     DME Arranged:    DME Agency:     HH Arranged:    HH Agency:     Status of Service:  In process, will continue to follow  If discussed at Long Length of Stay Meetings, dates discussed:    Additional Comments:  Pollie Friar, RN 05/26/2017, 10:45 AM

## 2017-05-26 NOTE — Evaluation (Signed)
Physical Therapy Evaluation Patient Details Name: Sarah Bean MRN: [id number] DOB: [birth year] Today's Date: 05/26/2017   History of Present Illness  Pt is a 51 y/o female admitted following R sided numbness and tingling and slurred speech. CT revealed L occipital lobe infarct. PMH includes HTN, depression, and obesity.   Clinical Impression  Pt admitted secondary to problem above with deficits below. PTA, pt was independent with functional mobility. Reported she had some instability and difficulty with vision, however, had never had a fall. Upon eval, pt with slightly decreased cognition, decreased balance, and decreased strength. Pt requiring min guard assist for mobility. Pt reporting she has an autistic son at home, but has other family that can check on her as needed. Will need 3 in 1 at d/c. Will continue to follow to maximize functional mobility independence and safety.     Follow Up Recommendations No PT follow up;Supervision - Intermittent    Equipment Recommendations  3in1 (PT)    Recommendations for Other Services       Precautions / Restrictions Precautions Precautions: None Restrictions Weight Bearing Restrictions: No      Mobility  Bed Mobility Overal bed mobility: Needs Assistance Bed Mobility: Supine to Sit;Sit to Supine     Supine to sit: Min guard Sit to supine: Min guard   General bed mobility comments: Min guard for safety   Transfers Overall transfer level: Needs assistance Equipment used: None Transfers: Sit to/from Stand Sit to Stand: Min guard         General transfer comment: Min guard for safety.   Ambulation/Gait Ambulation/Gait assistance: Min guard Ambulation Distance (Feet): 200 Feet Assistive device: None Gait Pattern/deviations: Step-through pattern;Decreased stride length;Drifts right/left Gait velocity: Decreased Gait velocity interpretation: Below normal speed for age/gender General Gait Details: Slow, slightly unsteady gait.  Gazed look occasionally during gait and pt would stop in hallway. When asked if she was ok, she reported she was and reported no symptoms. Performed vertical/horizontal head turns during gait. More difficulty noted with vertical head turns.   Stairs            Wheelchair Mobility    Modified Rankin (Stroke Patients Only)       Balance Overall balance assessment: Needs assistance Sitting-balance support: No upper extremity supported;Feet supported Sitting balance-Leahy Scale: Good     Standing balance support: No upper extremity supported;During functional activity Standing balance-Leahy Scale: Fair                               Pertinent Vitals/Pain Pain Assessment: No/denies pain    Home Living Family/patient expects to be discharged to:: Private residence Living Arrangements: Children Available Help at Discharge: Family;Available PRN/intermittently Type of Home: House Home Access: Stairs to enter Entrance Stairs-Rails: None Entrance Stairs-Number of Steps: 3 Home Layout: One level Home Equipment: None      Prior Function Level of Independence: Independent               Hand Dominance   Dominant Hand: Right    Extremity/Trunk Assessment   Upper Extremity Assessment Upper Extremity Assessment: Defer to OT evaluation    Lower Extremity Assessment Lower Extremity Assessment: Generalized weakness;RLE deficits/detail (grossly 3+/5 throughout) RLE Deficits / Details: Noticed difficulty sequencing with RLE during manual muscle test.     Cervical / Trunk Assessment Cervical / Trunk Assessment: Normal  Communication   Communication: No difficulties  Cognition Arousal/Alertness: Awake/alert Behavior During Therapy: [facility] for  tasks assessed/performed Overall Cognitive Status: No family/caregiver present to determine baseline cognitive functioning                                 General Comments: Some difficulty sequencing  noticed during RLE MMT. Some slowed cognitive processing noted as well as pt was not able to find room, even with family member standing at door upon return.       General Comments General comments (skin integrity, edema, etc.): Pt reporting no visual deficits; no blurriness or difficulty reading. Tested peripheral vision and pt able to see object at same point in periphery on both sides. Reports some concerns with safety during showering and would like to have a seat. Educated about getting BSC at d/c.     Exercises     Assessment/Plan    PT Assessment Patient needs continued PT services  PT Problem List Decreased strength;Decreased balance;Decreased knowledge of use of DME;Decreased knowledge of precautions;Decreased safety awareness       PT Treatment Interventions DME instruction;Gait training;Stair training;Functional mobility training;Therapeutic activities;Therapeutic exercise;Balance training;Neuromuscular re-education;Patient/family education    PT Goals (Current goals can be found in the Care Plan section)  Acute Rehab PT Goals Patient Stated Goal: to go home  PT Goal Formulation: With patient Time For Goal Achievement: 06/02/17 Potential to Achieve Goals: Good    Frequency Min 4X/week   Barriers to discharge        Co-evaluation               AM-PAC PT "6 Clicks" Daily Activity  Outcome Measure Difficulty turning over in bed (including adjusting bedclothes, sheets and blankets)?: A Little Difficulty moving from lying on back to sitting on the side of the bed? : A Little Difficulty sitting down on and standing up from a chair with arms (e.g., wheelchair, bedside commode, etc,.)?: Total Help needed moving to and from a bed to chair (including a wheelchair)?: A Little Help needed walking in hospital room?: A Little Help needed climbing 3-5 steps with a railing? : A Little 6 Click Score: 16    End of Session Equipment Utilized During Treatment: Gait  belt Activity Tolerance: Patient tolerated treatment well Patient left: in bed;with call bell/phone within reach;with family/visitor present Nurse Communication: Mobility status PT Visit Diagnosis: Other abnormalities of gait and mobility (R26.89);Other symptoms and signs involving the nervous system (R29.898)    Time: 0998-3382 PT Time Calculation (min) (ACUTE ONLY): 17 min   Charges:   PT Evaluation $PT Eval Low Complexity: 1 Low     PT G Codes:   PT G-Codes **NOT FOR INPATIENT CLASS** Functional Assessment Tool Used: AM-PAC 6 Clicks Basic Mobility;Clinical judgement Functional Limitation: Mobility: Walking and moving around Mobility: Walking and Moving Around Current Status (N0539): At least 40 percent but less than 60 percent impaired, limited or restricted Mobility: Walking and Moving Around Goal Status [id number]): At least 1 percent but less than 20 percent impaired, limited or restricted    Leighton Ruff, PT, DPT  Acute Rehabilitation Services  Pager: [place] 05/26/2017, 4:17 PM

## 2017-05-26 NOTE — Progress Notes (Signed)
PROGRESS NOTE    Sarah Bean  QPY:[id number] DOB: 1966/10/08 DOA: 05/25/2017 PCP: Inc, [facility]   Outpatient Specialists:     Brief Narrative:  Sarah Bean is a 51 y.o. female with medical history significant of restless legs; obesity; bipolar DO; insomnia; and HTN presenting with neurologic symptoms concerning for TIA/CVA.  Yesterday she was at a family reunion when she developed right-sided face and hand numbness.  When she looked at people it looked like the room was spinning and she had diplopia.  The symptoms improved some but recurred.  Today about 6pm, once again her face started getting very numb.  Stood up and she almost fell twice. She felt dizzy and then her legs got weak and so she sat down.  She called her daughter and couldn't talk because of slurred speech.  Daughter told her if she was slurring it could be a stroke and so she came in.  No vision changes today, but blurry vision yesterday.  Posterior headache.  No dysphagia.   Assessment & Plan:   Principal Problem:   CVA (cerebral vascular accident) ([place]) Active Problems:   Essential hypertension   Hyperglycemia   Microcytic anemia   Suspected CVA -CT is concerning for "recent" left occipital CVA - likely correlating to time of symptom onset on the day prior to admission -MRI/MRA: unable to complete due to phobia -Carotid dopplers Bilateral 1-39% ICA stenosis, antegrade vertebral flow. -Echo pending -FLP: LDL 75 -HgbA1c pendig -ASA  -PT/OT/ST/Nutrition Consults - hypercoagulable panel ordered  HTN -Allow permissive HTN -Treat BP only if >220/120 -She has not previously been prescribed medication for this issue, it appears; she will need this at this time of discharge  Hyperglycemia -Glucose 112 -A1c pending  Anemia -Hgb 8.4, prior 8.9 in 8/15; MCV 66.3; RDW 19.5 -MCV is <80 and so this is microcytic anemia -MCV is < 70 indicating probable iron deficiency -With an MCV  <70, elevated RDW, and reason for iron deficiency - iron deficiency can be presumed -add FeSO4 at the time of discharge    DVT prophylaxis:  Lovenox   Code Status: Full Code   Family Communication: At bedside  Disposition Plan:     Consultants:  neuro  Subjective: Unable to tolerate MRI due to claustrophobia   Objective: Vitals:   05/26/17 0445 05/26/17 0647 05/26/17 0932 05/26/17 1335  BP: 138/80 (!) 165/93 139/83 138/77  Pulse: 72 70 81 74  Resp: 18 16 20    Temp: 97.9 F (36.6 C) 98 F (36.7 C) 98.1 F (36.7 C) 97.8 F (36.6 C)  TempSrc: Oral Oral Oral Oral  SpO2: 96% 97% 97% 95%  Weight:      Height:        Intake/Output Summary (Last 24 hours) at 05/26/17 1511 Last data filed at 05/26/17 1335  Gross per 24 hour  Intake              480 ml  Output                0 ml  Net              480 ml   Filed Weights   05/25/17 1853  Weight: 117.9 kg (260 lb)    Examination:  General exam: Appears calm and comfortable  Respiratory system: Clear to auscultation. Respiratory effort normal. Cardiovascular system: S1 & S2 heard, RRR. No JVD, murmurs, rubs, gallops or clicks. No pedal edema. Gastrointestinal system: Abdomen is nondistended,  soft and nontender. No organomegaly or masses felt. Normal bowel sounds heard. Central nervous system: Alert and oriented. No focal neurological deficits. Extremities: Symmetric 5 x 5 power. Skin: No rashes, lesions or ulcers Psychiatry: Judgement and insight appear normal. Mood & affect appropriate.     Data Reviewed: I have personally reviewed following labs and imaging studies  CBC:  Recent Labs Lab 05/25/17 1914 05/25/17 1921  WBC 11.1*  --   NEUTROABS 8.2*  --   HGB 8.4* 9.9*  HCT 27.8* 29.0*  MCV 66.3*  --   PLT 429*  --    Basic Metabolic Panel:  Recent Labs Lab 05/25/17 1914 05/25/17 1921  NA 137 141  K 3.4* 3.5  CL 106 104  CO2 24  --   GLUCOSE 112* 112*  BUN 10 9  CREATININE 0.85 0.80    CALCIUM 8.8*  --    GFR: Estimated Creatinine Clearance: 107.4 mL/min (by C-G formula based on SCr of 0.8 mg/dL). Liver Function Tests:  Recent Labs Lab 05/25/17 1914  AST 26  ALT 23  ALKPHOS 70  BILITOT 0.1*  PROT 6.5  ALBUMIN 3.2*   No results for input(s): LIPASE, AMYLASE in the last 168 hours. No results for input(s): AMMONIA in the last 168 hours. Coagulation Profile:  Recent Labs Lab 05/25/17 1914  INR 1.00   Cardiac Enzymes:  Recent Labs Lab 05/25/17 2355 05/26/17 0319 05/26/17 1049  TROPONINI <0.03 <0.03 <0.03   BNP (last 3 results) No results for input(s): PROBNP in the last 8760 hours. HbA1C: No results for input(s): HGBA1C in the last 72 hours. CBG:  Recent Labs Lab 05/25/17 1913  GLUCAP 109*   Lipid Profile:  Recent Labs  05/26/17 0319  CHOL 135  HDL 54  LDLCALC 75  TRIG 29  CHOLHDL 2.5   Thyroid Function Tests:  Recent Labs  05/26/17 0319  TSH 3.191   Anemia Panel: No results for input(s): VITAMINB12, FOLATE, FERRITIN, TIBC, IRON, RETICCTPCT in the last 72 hours. Urine analysis: No results found for: COLORURINE, APPEARANCEUR, LABSPEC, [place], GLUCOSEU, HGBUR, BILIRUBINUR, KETONESUR, PROTEINUR, UROBILINOGEN, NITRITE, LEUKOCYTESUR   )No results found for this or any previous visit (from the past 240 hour(s)).    Anti-infectives    None       Radiology Studies: Dg Chest 2 View  Result Date: 05/26/2017 CLINICAL DATA:  Acute onset of transient ischemic attack. Initial encounter. EXAM: CHEST  2 VIEW COMPARISON:  Chest radiograph performed 08/28/2015 FINDINGS: The lungs are well-aerated. Mild peribronchial thickening is noted. There is no evidence of focal opacification, pleural effusion or pneumothorax. The heart is normal in size; the mediastinal contour is within normal limits. No acute osseous abnormalities are seen. IMPRESSION: Mild peribronchial thickening noted.  Lungs otherwise grossly clear. Electronically Signed   By:  Garald Balding M.D.   On: 05/26/2017 04:36   Ct Head Wo Contrast  Result Date: 05/25/2017 CLINICAL DATA:  Right-sided numbness and slurred speech onset yesterday. EXAM: CT HEAD WITHOUT CONTRAST TECHNIQUE: Contiguous axial images were obtained from the base of the skull through the vertex without intravenous contrast. COMPARISON:  11/02/2011 FINDINGS: Brain: Small left occipital lobe nonhemorrhagic infarct, new since 2013 suspicious for a recent acute or subacute infarct. No hydrocephalus, intra-axial mass nor extra-axial collections. No large vascular territory infarcts. Fourth ventricle and basal cisterns are midline without effacement. Vascular: No hyperdense vessel or unexpected calcification. Skull: Negative for fracture or focal lesion. Sinuses/Orbits: No acute finding. Other: None. IMPRESSION: Small left occipital lobe  nonhemorrhagic, recent appearing infarct. Electronically Signed   By: Ashley Royalty M.D.   On: 05/25/2017 19:57        Scheduled Meds: . aspirin  300 mg Rectal Daily   Or  . aspirin  325 mg Oral Daily  . enoxaparin (LOVENOX) injection  40 mg Subcutaneous Q24H  . QUEtiapine  300 mg Oral QHS  . traZODone  150 mg Oral QHS   Continuous Infusions:   LOS: 0 days    Time spent: 25 min    [place], DO [facility] Pager [phone]  If 7PM-7AM, please contact night-coverage [email or web address] Password [email or web address] 05/26/2017, 3:11 PM

## 2017-05-27 ENCOUNTER — Inpatient Hospital Stay (HOSPITAL_COMMUNITY): Payer: Medicaid Other

## 2017-05-27 DIAGNOSIS — R202 Paresthesia of skin: Secondary | ICD-10-CM

## 2017-05-27 DIAGNOSIS — R200 Anesthesia of skin: Secondary | ICD-10-CM

## 2017-05-27 LAB — LUPUS ANTICOAGULANT PANEL
DRVVT: 30.7 s (ref 0.0–47.0)
PTT Lupus Anticoagulant: 27.8 s (ref 0.0–51.9)

## 2017-05-27 LAB — HOMOCYSTEINE: HOMOCYSTEINE-NORM: 9.4 umol/L (ref 0.0–15.0)

## 2017-05-27 LAB — CARDIOLIPIN ANTIBODIES, IGG, IGM, IGA
ANTICARDIOLIPIN IGA: 9 U/mL (ref 0–11)
Anticardiolipin IgG: <9 GPL U/mL (ref 0–14)
Anticardiolipin IgM: <9 MPL U/mL (ref 0–12)

## 2017-05-27 LAB — HEMOGLOBIN A1C
HEMOGLOBIN A1C: 5.5 % (ref 4.8–5.6)
Mean Plasma Glucose: 111 mg/dL

## 2017-05-27 LAB — BETA-2-GLYCOPROTEIN I ABS, IGG/M/A: Beta-2-Glycoprotein I IgM: <9 GPI IgM units (ref 0–32)

## 2017-05-27 LAB — PROTEIN C ACTIVITY: Protein C Activity: 99 % (ref 73–180)

## 2017-05-27 LAB — PROTEIN S ACTIVITY: PROTEIN S ACTIVITY: 51 % — AB (ref 63–140)

## 2017-05-27 LAB — PROTEIN S, TOTAL: Protein S Ag, Total: 47 % — ABNORMAL LOW (ref 60–150)

## 2017-05-27 MED ORDER — LORAZEPAM 2 MG/ML IJ SOLN
2.0000 mg | Freq: Once | INTRAMUSCULAR | Status: AC | PRN
  Administered 2017-05-27: 2 mg via INTRAVENOUS
  Filled 2017-05-27: qty 1

## 2017-05-27 NOTE — Progress Notes (Signed)
Pt refused exam after receiving anti-anxiety medicine. She would not let me place the head coil over her face. This was the 2nd attempt to complete her MRI with sedation.

## 2017-05-27 NOTE — Progress Notes (Signed)
PT Cancellation Note  Patient Details Name: HELEN WINTERHALTER MRN: [id number] DOB: 04-17-1966   Cancelled Treatment:    Reason Eval/Treat Not Completed: Other (comment).  Pt just waking up, has not eaten breakfast.  PT to return later today as time allows to ambulate to give pt a chance to wake up and eat breakfast.   Thanks,    Wells Guiles B. [place], [place], DPT [phone]   05/27/2017, 9:44 AM

## 2017-05-27 NOTE — Progress Notes (Signed)
PROGRESS NOTE    Sarah Bean  PVX:[id number] DOB: May 14, 1966 DOA: 05/25/2017 PCP: [facility]   Outpatient Specialists:     Brief Narrative:  Sarah Bean is a 51 y.o. female with medical history significant of restless legs; obesity; bipolar DO; insomnia; and HTN presenting with neurologic symptoms concerning for TIA/CVA.  Yesterday she was at a family reunion when she developed right-sided face and hand numbness.  When she looked at people it looked like the room was spinning and she had diplopia.  The symptoms improved some but recurred.  Today about 6pm, once again her face started getting very numb.  Stood up and she almost fell twice. She felt dizzy and then her legs got weak and so she sat down.  She called her daughter and couldn't talk because of slurred speech.  Daughter told her if she was slurring it could be a stroke and so she came in.  No vision changes today, but blurry vision yesterday.  Posterior headache.  No dysphagia.  Awaiting MRI   Assessment & Plan:   Principal Problem:   CVA (cerebral vascular accident) ([place]) Active Problems:   Essential hypertension   Hyperglycemia   Microcytic anemia   Suspected CVA -CT is concerning for "recent" left occipital CVA - likely correlating to time of symptom onset on the day prior to admission -MRI/MRA: unable to complete due to phobia- will try again with more sedation -Carotid dopplers Bilateral 1-39% ICA stenosis, antegrade vertebral flow. -Echo: Study Conclusions - Left ventricle: The cavity size was normal. Systolic function was   normal. The estimated ejection fraction was in the range of 60%   to 65%. Wall motion was normal; there were no regional wall   motion abnormalities. Left ventricular diastolic function   parameters were normal. - Aortic valve: There was trivial regurgitation. - Mitral valve: Calcified annulus. - Pulmonary arteries: PA peak pressure: 31 mm Hg (S). -FLP: LDL  75 -HgbA1c 5.5 -ASA  -PT/OT/ST/Nutrition Consults - hypercoagulable panel ordered and many are pending  HTN -Allow permissive HTN -Treat BP only if >220/120   Hyperglycemia -Glucose 112 -A1c: 5.5  Anemia -Hgb 8.4, prior 8.9 in 8/15; MCV 66.3; RDW 19.5 -MCV is <80 and so this is microcytic anemia -MCV is < 70 indicating probable iron deficiency -With an MCV <70, elevated RDW, and reason for iron deficiency - iron deficiency can be presumed -add FeSO4 at the time of discharge  Morbid obesity Body mass index is 44.63 kg/m.   DVT prophylaxis:  Lovenox   Code Status: Full Code   Family Communication: At bedside  Disposition Plan:     Consultants:  neuro  Subjective: Sleepy this AM-- say she will try the MRI again   Objective: Vitals:   05/27/17 0511 05/27/17 0815 05/27/17 0956 05/27/17 1323  BP: 118/63 133/89 (!) 150/99 129/72  Pulse: 88 92 (!) 106 78  Resp: 20 20 19 18   Temp: 98.9 F (37.2 C) 98.2 F (36.8 C) 99.1 F (37.3 C) 97.9 F (36.6 C)  TempSrc: Oral Oral Oral Oral  SpO2: 95% 97% 98% 95%  Weight:      Height:        Intake/Output Summary (Last 24 hours) at 05/27/17 1344 Last data filed at 05/27/17 0631  Gross per 24 hour  Intake              480 ml  Output  0 ml  Net              480 ml   Filed Weights   05/25/17 1853  Weight: 117.9 kg (260 lb)    Examination:  General exam: sleepy Respiratory system: clear Cardiovascular system: rrr Gastrointestinal system: +BS, soft Central nervous system: moves all 4 ext, no focal weakness Skin: no rash Psychiatry: flat affect    Data Reviewed: I have personally reviewed following labs and imaging studies  CBC:  Recent Labs Lab 05/25/17 1914 05/25/17 1921  WBC 11.1*  --   NEUTROABS 8.2*  --   HGB 8.4* 9.9*  HCT 27.8* 29.0*  MCV 66.3*  --   PLT 429*  --    Basic Metabolic Panel:  Recent Labs Lab 05/25/17 1914 05/25/17 1921  NA 137 141  K 3.4* 3.5  CL  106 104  CO2 24  --   GLUCOSE 112* 112*  BUN 10 9  CREATININE 0.85 0.80  CALCIUM 8.8*  --    GFR: Estimated Creatinine Clearance: 107.4 mL/min (by C-G formula based on SCr of 0.8 mg/dL). Liver Function Tests:  Recent Labs Lab 05/25/17 1914  AST 26  ALT 23  ALKPHOS 70  BILITOT 0.1*  PROT 6.5  ALBUMIN 3.2*   No results for input(s): LIPASE, AMYLASE in the last 168 hours. No results for input(s): AMMONIA in the last 168 hours. Coagulation Profile:  Recent Labs Lab 05/25/17 1914  INR 1.00   Cardiac Enzymes:  Recent Labs Lab 05/25/17 2355 05/26/17 0319 05/26/17 1049  TROPONINI <0.03 <0.03 <0.03   BNP (last 3 results) No results for input(s): PROBNP in the last 8760 hours. HbA1C:  Recent Labs  05/26/17 0319  HGBA1C 5.5   CBG:  Recent Labs Lab 05/25/17 1913  GLUCAP 109*   Lipid Profile:  Recent Labs  05/26/17 0319  CHOL 135  HDL 54  LDLCALC 75  TRIG 29  CHOLHDL 2.5   Thyroid Function Tests:  Recent Labs  05/26/17 0319  TSH 3.191   Anemia Panel: No results for input(s): VITAMINB12, FOLATE, FERRITIN, TIBC, IRON, RETICCTPCT in the last 72 hours. Urine analysis: No results found for: COLORURINE, APPEARANCEUR, LABSPEC, [place], GLUCOSEU, HGBUR, BILIRUBINUR, KETONESUR, PROTEINUR, UROBILINOGEN, NITRITE, LEUKOCYTESUR   )No results found for this or any previous visit (from the past 240 hour(s)).    Anti-infectives    None       Radiology Studies: Dg Chest 2 View  Result Date: 05/26/2017 CLINICAL DATA:  Acute onset of transient ischemic attack. Initial encounter. EXAM: CHEST  2 VIEW COMPARISON:  Chest radiograph performed 08/28/2015 FINDINGS: The lungs are well-aerated. Mild peribronchial thickening is noted. There is no evidence of focal opacification, pleural effusion or pneumothorax. The heart is normal in size; the mediastinal contour is within normal limits. No acute osseous abnormalities are seen. IMPRESSION: Mild peribronchial  thickening noted.  Lungs otherwise grossly clear. Electronically Signed   By: Garald Balding M.D.   On: 05/26/2017 04:36   Ct Head Wo Contrast  Result Date: 05/25/2017 CLINICAL DATA:  Right-sided numbness and slurred speech onset yesterday. EXAM: CT HEAD WITHOUT CONTRAST TECHNIQUE: Contiguous axial images were obtained from the base of the skull through the vertex without intravenous contrast. COMPARISON:  11/02/2011 FINDINGS: Brain: Small left occipital lobe nonhemorrhagic infarct, new since 2013 suspicious for a recent acute or subacute infarct. No hydrocephalus, intra-axial mass nor extra-axial collections. No large vascular territory infarcts. Fourth ventricle and basal cisterns are midline without effacement. Vascular: No hyperdense  vessel or unexpected calcification. Skull: Negative for fracture or focal lesion. Sinuses/Orbits: No acute finding. Other: None. IMPRESSION: Small left occipital lobe nonhemorrhagic, recent appearing infarct. Electronically Signed   By: Ashley Royalty M.D.   On: 05/25/2017 19:57        Scheduled Meds: . aspirin  300 mg Rectal Daily   Or  . aspirin  325 mg Oral Daily  . atorvastatin  40 mg Oral q1800  . enoxaparin (LOVENOX) injection  40 mg Subcutaneous Q24H  . QUEtiapine  300 mg Oral QHS  . rOPINIRole  0.25 mg Oral QHS  . traZODone  150 mg Oral QHS   Continuous Infusions:   LOS: 1 day    Time spent: 25 min    [place], DO [facility] Pager [phone]  If 7PM-7AM, please contact night-coverage [email or web address] Password [email or web address] 05/27/2017, 1:44 PM

## 2017-05-27 NOTE — Evaluation (Signed)
SLP Cancellation Note  Patient Details Name: Sarah Bean MRN: [id number] DOB: 07/10/1966   Cancelled treatment:       Reason Eval/Treat Not Completed: Fatigue/lethargy limiting ability to participate (pt too sleepy, did not awaken adequately for evaluation)   Claudie Fisherman, [place] [facility] SLP [phone]

## 2017-05-27 NOTE — Progress Notes (Signed)
Physical Therapy Treatment Patient Details Name: Sarah Bean MRN: [id number] DOB: 07-21-1966 Today's Date: 05/27/2017    History of Present Illness Pt is a 51 y/o female admitted following R sided numbness and tingling and slurred speech. CT revealed L occipital lobe infarct. PMH includes HTN, depression, and obesity.     PT Comments    Pt is looking much better this PM than she did this AM, we think it was related to sedative given last night for MRI.  Pt is much more cognitively sharp and much more steady on her feet this PM than she was during her AM OT session. She still staggers and has difficulty with obstacle navigation, but I think this is more related to her visual deficits vs her balance.  I plan to complete the DGI and practice stairs if she is here tomorrow.   Follow Up Recommendations  No PT follow up;Supervision - Intermittent     Equipment Recommendations  3in1 (PT)    Recommendations for Other Services   NA     Precautions / Restrictions Precautions Precautions: Fall Precaution Comments: Mildly unsteady on her feet    Mobility  Bed Mobility Overal bed mobility: Independent                Transfers Overall transfer level: Needs assistance Equipment used: None Transfers: Sit to/from Stand Sit to Stand: Supervision         General transfer comment: supervision for safety.   Ambulation/Gait Ambulation/Gait assistance: Min guard Ambulation Distance (Feet): 200 Feet Assistive device: None Gait Pattern/deviations: Step-through pattern;Staggering right;Staggering left Gait velocity: Decreased Gait velocity interpretation: Below normal speed for age/gender General Gait Details: Pt with mildly unsteady gait pattern, seemingly improved from OT note this AM.  Pt externally rotates her left leg more than her right.  She reports this is baseline. Some components of DGI completed, pt having difficulty with step over and stepping around obstacles, but I think  it is more because of visual deficits than her actual balance.        Modified Rankin (Stroke Patients Only) Modified Rankin (Stroke Patients Only) Pre-Morbid Rankin Score: No symptoms Modified Rankin: Moderately severe disability     Balance Overall balance assessment: Needs assistance Sitting-balance support: No upper extremity supported Sitting balance-Leahy Scale: Good     Standing balance support: No upper extremity supported Standing balance-Leahy Scale: Good                              Cognition Arousal/Alertness: Awake/alert Behavior During Therapy: WFL for tasks assessed/performed Overall Cognitive Status: Within Functional Limits for tasks assessed                                 General Comments: Much more awake and alert this PM, significantly different than AM.       Exercises          Pertinent Vitals/Pain Pain Assessment: No/denies pain           PT Goals (current goals can now be found in the care plan section) Acute Rehab PT Goals Patient Stated Goal: to go home to be with her son Progress towards PT goals: Progressing toward goals    Frequency    Min 4X/week      PT Plan Current plan remains appropriate       AM-PAC PT "6 Clicks" Daily Activity  Outcome Measure  Difficulty turning over in bed (including adjusting bedclothes, sheets and blankets)?: None Difficulty moving from lying on back to sitting on the side of the bed? : None Difficulty sitting down on and standing up from a chair with arms (e.g., wheelchair, bedside commode, etc,.)?: None Help needed moving to and from a bed to chair (including a wheelchair)?: None Help needed walking in hospital room?: A Little Help needed climbing 3-5 steps with a railing? : A Little 6 Click Score: 22    End of Session Equipment Utilized During Treatment: Gait belt Activity Tolerance: Patient tolerated treatment well Patient left: in bed;with call bell/phone  within reach;with family/visitor present   PT Visit Diagnosis: Other abnormalities of gait and mobility (R26.89);Other symptoms and signs involving the nervous system (V78.588)     Time: 5027-7412 PT Time Calculation (min) (ACUTE ONLY): 11 min  Charges:  $Gait Training: 8-22 mins          Hibah Odonnell B. Metolius, [place], DPT [phone]            05/27/2017, 4:39 PM

## 2017-05-27 NOTE — Progress Notes (Addendum)
Occupational Therapy Evaluation Patient Details Name: Sarah Bean MRN: [id number] DOB: May 04, 1966 Today's Date: 05/27/2017    History of Present Illness Pt is a 51 y/o female admitted following R sided numbness and tingling and slurred speech. CT revealed L occipital lobe infarct. PMH includes HTN, depression, and obesity.    Clinical Impression   PTA, pt lived at home with her 49 yo autistic son (pt is primary caregiver), was independent with mobility and ADL and used public transportation for community access. Pt states her arm numbness and speech has improved. Pt states vision is not "100%, but improving.Pt unsteady this am and is not safe at this time to be checked off as independent ambulator in room. Nsg notified and states pt had "medicine" which is likely contributing to this unsteadiness. During visual field assessment, pt had more difficulty identifying targets in R upper quadrant. Pt states she plans to DC to live with her daughter for a "little while" so she will have help with caring for her autistic son. At this time, recommend follow up with OT at the neuro outpt center to maximize functional level of independence with IADL tasks. Will follow acutely to address established goals and facilitate safe DC home.      Follow Up Recommendations  Outpatient OT;Supervision - Intermittent    Equipment Recommendations  Tub/shower seat    Recommendations for Other Services  Social Work consult to assess available community resources to help pt with her autistic son     Precautions / Restrictions Precautions Precautions: Fall Restrictions Weight Bearing Restrictions: No      Mobility Bed Mobility Overal bed mobility: Modified Independent                Transfers Overall transfer level: Needs assistance Equipment used: None Transfers: Sit to/from Stand Sit to Stand: Min guard         General transfer comment: Min guard for safety.     Balance Overall balance  assessment: Needs assistance Sitting-balance support: No upper extremity supported;Feet supported Sitting balance-Leahy Scale: Good     Standing balance support: No upper extremity supported;During functional activity Standing balance-Leahy Scale: Fair                             ADL either performed or assessed with clinical judgement   ADL Overall ADL's : Needs assistance/impaired     Grooming: Standing;Min guard   Upper Body Bathing: Set up;Sitting   Lower Body Bathing: Min guard;Sit to/from stand   Upper Body Dressing : Set up;Sitting   Lower Body Dressing: Min guard   Toilet Transfer: Min guard;Ambulation   Toileting- Clothing Manipulation and Hygiene: Supervision/safety       Functional mobility during ADLs: Min guard (unsteady. Pt reports she had sedating meds last night ) General ADL Comments: Pt ableto give information regarding her medications; Takes public transportation     Vision Patient Visual Report: Blurring of vision Vision Assessment?: Yes Eye Alignment: Within Functional Limits Ocular Range of Motion: Within Functional Limits Alignment/Gaze Preference: Within Defined Limits Tracking/Visual Pursuits: Able to track stimulus in all quads without difficulty Saccades: Additional eye shifts occurred during testing Convergence: Within functional limits Visual Fields: Impaired-to be further tested in functional context Additional Comments: Pt missed several targets in R upper visual field. Functionally appears to be compensating.     Perception Perception Comments: no apparent deficits   Praxis Praxis Praxis tested?: Within functional limits  Pertinent Vitals/Pain Pain Assessment: No/denies pain     Hand Dominance Right   Extremity/Trunk Assessment Upper Extremity Assessment Upper Extremity Assessment: RUE deficits/detail RUE Deficits / Details: minimal RUE deficits noted. Pt states her sensation is returning to normal. MMT  WFL RUE Coordination: decreased fine motor   Lower Extremity Assessment RLE Deficits / Details: Noticed difficulty sequencing with RLE during manual muscle test.    Cervical / Trunk Assessment Cervical / Trunk Assessment: Normal   Communication Communication Communication: No difficulties   Cognition Arousal/Alertness: Awake/alert Behavior During Therapy: WFL for tasks assessed/performed Overall Cognitive Status: No family/caregiver present to determine baseline cognitive functioning                                 General Comments: slow processing; pt reports having medicine which has made her "foggy"   General Comments    Pt's boyfriend present at end of session and verbalized understanding of DC recommendations.    Exercises     Shoulder Instructions      Home Living Family/patient expects to be discharged to:: Private residence Living Arrangements: Children Available Help at Discharge: Family;Available PRN/intermittently Type of Home: House Home Access: Stairs to enter [employer] of Steps: 3 Entrance Stairs-Rails: None Home Layout: One level     Bathroom Shower/Tub: Teacher, early years/pre: Standard Bathroom Accessibility: Yes How Accessible: Accessible via walker Home Equipment: None          Prior Functioning/Environment Level of Independence: Independent                 OT Problem List: Impaired balance (sitting and/or standing);Impaired vision/perception;Decreased knowledge of use of DME or AE;Obesity      OT Treatment/Interventions: Self-care/ADL training;DME and/or AE instruction;Therapeutic activities;Visual/perceptual remediation/compensation;Patient/family education;Balance training    OT Goals(Current goals can be found in the care plan section) Acute Rehab OT Goals Patient Stated Goal: to go home to be with her son OT Goal Formulation: With patient Time For Goal Achievement: 06/10/17 Potential to  Achieve Goals: Good ADL Goals Pt Will Transfer to Toilet: with modified independence;ambulating Pt Will Perform Tub/Shower Transfer: with modified independence;Tub transfer;ambulating;shower seat Additional ADL Goal #1: Pt will independently read persription lable tna dverbalize correct dosage and times Additional ADL Goal #2: Modied independence with functional mobility for ADL  OT Frequency: Min 3X/week   Barriers to D/C:            Co-evaluation              AM-PAC PT "6 Clicks" Daily Activity     Outcome Measure Help from another person eating meals?: None Help from another person taking care of personal grooming?: None Help from another person toileting, which includes using toliet, bedpan, or urinal?: A Little Help from another person bathing (including washing, rinsing, drying)?: A Little Help from another person to put on and taking off regular upper body clothing?: None Help from another person to put on and taking off regular lower body clothing?: A Little 6 Click Score: 21   End of Session Equipment Utilized During Treatment: Gait belt Nurse Communication: Mobility status;Other (comment) (INDEPENDENT AMBULATOR IN ROOM REMOVED)  Activity Tolerance: Patient tolerated treatment well Patient left: in chair;with call bell/phone within reach;with chair alarm set;with family/visitor present  OT Visit Diagnosis: Unsteadiness on feet (R26.81);Low vision, both eyes (H54.2)  Time: 3016-0109 OT Time Calculation (min): 27 min Charges:  OT General Charges $OT Visit: 1 Procedure OT Evaluation $OT Eval Moderate Complexity: 1 Procedure OT Treatments $Self Care/Home Management : 8-22 mins G-Codes:     [facility], OT/L  [phone] 05/27/2017  Serjio Deupree,HILLARY 05/27/2017, 2:11 PM

## 2017-05-27 NOTE — Progress Notes (Signed)
STROKE TEAM PROGRESS NOTE   HISTORY OF PRESENT ILLNESS (per record)  HPI: Sarah Bean is an 51 y.o. female with a history of hypertension, obesity and bipolar affective disorder presenting with numbness involving right side of her face and right upper extremity of sudden onset. Initial symptoms occurred yesterday and lasted for about 1 hour. Current symptoms started at 6:00 PM on 05/25/2017 and have persisted. She had slurred speech on 05/25/2017, which has resolved. Numbness has persisted.  The patient also reports vertigo that began about one week ago.  CT scan of her head showed no acute intracranial abnormality. She has not been on antiplatelet therapy daily. NIH stroke score was 1.  LSN: 1800 on 05/25/2017  Patient was not administered IV t-PA secondary to low NIHSS/minimal deficits on arrival. She was admitted   for further evaluation and treatment.   SUBJECTIVE (INTERVAL HISTORY) Her husband is at the bedside.  The patient is awake, alert, and follows all commands appropriately.She is awaiting MRI with sedation   OBJECTIVE Temp:  [97.9 F (36.6 C)-99.1 F (37.3 C)] 97.9 F (36.6 C) (08/07 1323) Pulse Rate:  [75-106] 78 (08/07 1323) Cardiac Rhythm: Normal sinus rhythm (08/07 0700) Resp:  [18-20] 18 (08/07 1323) BP: (118-185)/(63-99) 129/72 (08/07 1323) SpO2:  [95 %-98 %] 95 % (08/07 1323)  CBC:   Recent Labs Lab 05/25/17 1914 05/25/17 1921  WBC 11.1*  --   NEUTROABS 8.2*  --   HGB 8.4* 9.9*  HCT 27.8* 29.0*  MCV 66.3*  --   PLT 429*  --     Basic Metabolic Panel:   Recent Labs Lab 05/25/17 1914 05/25/17 1921  NA 137 141  K 3.4* 3.5  CL 106 104  CO2 24  --   GLUCOSE 112* 112*  BUN 10 9  CREATININE 0.85 0.80  CALCIUM 8.8*  --     Lipid Panel:     Component Value Date/Time   CHOL 135 05/26/2017 0319   TRIG 29 05/26/2017 0319   HDL 54 05/26/2017 0319   CHOLHDL 2.5 05/26/2017 0319   VLDL 6 05/26/2017 0319   LDLCALC 75 05/26/2017 0319   HgbA1c:  Lab  Results  Component Value Date   HGBA1C 5.5 05/26/2017   Urine Drug Screen:     Component Value Date/Time   LABOPIA NONE DETECTED 05/25/2017 2304   COCAINSCRNUR NONE DETECTED 05/25/2017 2304   LABBENZ POSITIVE (A) 05/25/2017 2304   AMPHETMU NONE DETECTED 05/25/2017 2304   THCU NONE DETECTED 05/25/2017 2304   LABBARB NONE DETECTED 05/25/2017 2304    Alcohol Level No results found for: [facility]  IMAGING  Dg Chest 2 View 05/26/2017 IMPRESSION: Mild peribronchial thickening noted.  Lungs otherwise grossly clear.  Ct Head Wo Contrast 05/25/2017 IMPRESSION: Small left occipital lobe nonhemorrhagic, recent appearing infarct.  TTE 05/26/2017  pending  Carotid US 05/26/2017 Summary: - The vertebral arteries appear patent with antegrade flow. - Findings consistent with a 1- 41 percent stenosis involving the right internal carotid artery and the left internal carotid artery.     PHYSICAL EXAM Obese middle aged african american lady not in distress. . Afebrile. Head is nontraumatic. Neck is supple without bruit.    Cardiac exam no murmur or gallop. Lungs are clear to auscultation. Distal pulses are well felt. Neurological Exam ;  Awake  Alert oriented x 3. Normal speech and language.eye movements full without nystagmus.fundi were not visualized. Vision acuity and fields appear normal. Hearing is normal. Palatal movements are normal. Face symmetric.  Tongue midline. Normal strength, tone, reflexes and coordination. Normal sensation. Gait deferred.  ASSESSMENT/PLAN Sarah Bean is a 51 y.o. female with history of hypertension, obesity and bipolar affective disorder presenting with numbness involving right side of her face and right upper extremity of sudden onset. She did not receive IV t-PA due to low NIHSS/minimal deficits on arrival.   Suspect small left subcortical infarct  Resultant  Mild right sided numbness  CT head: Small left occipital lobe nonhemorrhagic, recent appearing  infarct.  MRI head:   ordered  MRA head:  ordered  Carotid Doppler: B ICA 1-39% stenosis, VAs antegrade  2D Echo   pending   LDL 75  HgbA1c pending   Lovenox 40 mg sq daily for VTE prophylaxis Diet Heart Room service appropriate? Yes; Fluid consistency: Thin  No antithrombotic prior to admission, now on aspirin 325 mg daily  Patient counseled to be compliant with her antithrombotic medications  Ongoing aggressive stroke risk factor management  Therapy recommendations:   pending  Disposition:   pending  Hypertension  Stable  Permissive hypertension (OK if < 220/120) but gradually normalize in 5-7 days  Long-term BP goal normotensive  Hyperlipidemia  Home meds:  none  LDL 74, goal < 70  Add atorvastatin 40mg  PO daily  Continue statin at discharge  Diabetes  HgbA1c  pending, goal < 7.0  Controlled  Other Stroke Risk Factors  Obesity, Body mass index is 44.63 kg/m., recommend weight loss, diet and exercise as appropriate  Other Active Problems  Iron deficiency anemia  Hospital day # 1 I have personally examined this patient, reviewed notes, independently viewed imaging studies, participated in medical decision making and plan of care.ROS completed by me personally and pertinent positives fully documented  I have made any additions or clarifications directly to the above note. She has presented with subjective numbness possibly from a small subcortical infarct. She could not tolerate MRI but is willing to go back and try with sedation. I had a long discussion the patient and her boyfriend and answered questions about stroke evaluation and treatment.    Antony Contras, MD Medical Director [place] Pager: [phone] 05/27/2017 1:42 PM   To contact Stroke Continuity provider, please refer to [email or web address] After hours, contact General Neurology

## 2017-05-28 LAB — BASIC METABOLIC PANEL
ANION GAP: 6 (ref 5–15)
BUN: 11 mg/dL (ref 6–20)
CALCIUM: 9.1 mg/dL (ref 8.9–10.3)
CO2: 27 mmol/L (ref 22–32)
Chloride: 103 mmol/L (ref 101–111)
Creatinine, Ser: 0.72 mg/dL (ref 0.44–1.00)
GLUCOSE: 88 mg/dL (ref 65–99)
POTASSIUM: 4.1 mmol/L (ref 3.5–5.1)
Sodium: 136 mmol/L (ref 135–145)

## 2017-05-28 LAB — CBC
HEMATOCRIT: 28 % — AB (ref 36.0–46.0)
Hemoglobin: 8.2 g/dL — ABNORMAL LOW (ref 12.0–15.0)
MCH: 19.6 pg — ABNORMAL LOW (ref 26.0–34.0)
MCHC: 29.3 g/dL — ABNORMAL LOW (ref 30.0–36.0)
MCV: 67 fL — AB (ref 78.0–100.0)
Platelets: 438 10*3/uL — ABNORMAL HIGH (ref 150–400)
RBC: 4.18 MIL/uL (ref 3.87–5.11)
RDW: 19.6 % — AB (ref 11.5–15.5)
WBC: 9.4 10*3/uL (ref 4.0–10.5)

## 2017-05-28 LAB — PROTEIN C, TOTAL: PROTEIN C, TOTAL: 78 % (ref 60–150)

## 2017-05-28 MED ORDER — ATORVASTATIN CALCIUM 40 MG PO TABS: 40.0000 mg | ORAL_TABLET | Freq: Every day | ORAL | 0 refills | Status: DC

## 2017-05-28 MED ORDER — ASPIRIN 325 MG PO TABS: 325.0000 mg | ORAL_TABLET | Freq: Every day | ORAL | 0 refills | Status: DC

## 2017-05-28 NOTE — Care Management Note (Addendum)
Case Management Note  Patient Details  Name: Sarah Bean MRN: [id number] Date of Birth: 1966/04/21  Subjective/Objective:                    Action/Plan: Pt discharging home with self care. CM consulted for outpatient therapy. CM met with the patient and she is interested in going to Outpatient rehab on [address] Orders in EPIC and information on the AVS.  Pt with orders for tub bench. Santiago Glad with [facility] notified and will deliver the equipment to the room.  Patients daughter to provide transportation home.   Expected Discharge Date:  05/28/17               Expected Discharge Plan:  OP Rehab  In-House Referral:     Discharge planning Services  CM Consult  Post Acute Care Choice:    Choice offered to:     DME Arranged:    DME Agency:     HH Arranged:    HH Agency:     Status of Service:  Completed, signed off  If discussed at [employer] of Stay Meetings, dates discussed:    Additional Comments:  Pollie Friar, RN 05/28/2017, 11:14 AM

## 2017-05-28 NOTE — Progress Notes (Signed)
Occupational Therapy Treatment Patient Details Name: Sarah Bean MRN: [id number] DOB: 11-May-1966 Today's Date: 05/28/2017    History of present illness Pt is a 51 y/o female admitted following R sided numbness and tingling and slurred speech. CT revealed L occipital lobe infarct. PMH includes HTN, depression, and obesity.    OT comments  Pt making good progress. Pt states she feels her vision is improving. Pt with improved performance during confrontation testing. Educated pt on home safety and reducing risk of falls. Pt verbalized understanding. Continue to recommend that pt DC initially home with daughter, who can assist with management of pt's autistic son in addition to assisting with IADL tasks as needed.   Follow Up Recommendations  Supervision - Intermittent;Outpatient OT    Equipment Recommendations  Tub/shower seat    Recommendations for Other Services      Precautions / Restrictions Precautions Precautions: Fall Restrictions Weight Bearing Restrictions: No       Mobility Bed Mobility Overal bed mobility: Independent                Transfers Overall transfer level: Modified independent                    Balance             Standing balance-Leahy Scale: Good                             ADL either performed or assessed with clinical judgement   ADL                                       Functional mobility during ADLs: Modified independent General ADL Comments: Pt overall modified independent with ADL. Educated pt regarding reducing risk of falls and importance of han=ving her family S her medication managment and financial management until she returns to baseline.      Vision   Additional Comments: Pt reports vision is improving. Pt with improved resonse to field assessment this am. reviewed compensatory technqieus to compensate for low vision if needed. Pt verbalized understanding. Writtent information  given.   Perception     Praxis      Cognition Arousal/Alertness: Awake/alert Behavior During Therapy: WFL for tasks assessed/performed Overall Cognitive Status: Within Functional Limits for tasks assessed                                          Exercises     Shoulder Instructions       General Comments      Pertinent Vitals/ Pain       Pain Assessment: No/denies pain  Home Living                                          Prior Functioning/Environment              Frequency           Progress Toward Goals  OT Goals(current goals can now be found in the care plan section)  Progress towards OT goals: Goals met/education completed, patient discharged from OT (adequate for DC)  Acute Rehab OT Goals Patient Stated  Goal: to go home to be with her son OT Goal Formulation: With patient Time For Goal Achievement: 06/10/17 Potential to Achieve Goals: Good ADL Goals Pt Will Transfer to Toilet: with modified independence;ambulating Pt Will Perform Tub/Shower Transfer: with modified independence;Tub transfer;ambulating;shower seat Additional ADL Goal #1: Pt will independently read persription lable tna dverbalize correct dosage and times Additional ADL Goal #2: Modied independence with functional mobility for ADL  Plan All goals met and education completed, patient discharged from OT services    Co-evaluation                 AM-PAC PT "6 Clicks" Daily Activity     Outcome Measure   Help from another person eating meals?: None Help from another person taking care of personal grooming?: None Help from another person toileting, which includes using toliet, bedpan, or urinal?: None Help from another person bathing (including washing, rinsing, drying)?: None Help from another person to put on and taking off regular upper body clothing?: None Help from another person to put on and taking off regular lower body clothing?:  None 6 Click Score: 24    End of Session    OT Visit Diagnosis: Unsteadiness on feet (R26.81);Low vision, both eyes (H54.2)   Activity Tolerance Patient tolerated treatment well   Patient Left in bed;with call bell/phone within reach;with bed alarm set   Nurse Communication Mobility status        Time: 1224-8250 OT Time Calculation (min): 19 min  Charges: OT General Charges $OT Visit: 1 Procedure OT Treatments $Self Care/Home Management : 8-22 mins  [facility] [address] [address], OT/L  [phone] 05/28/2017   Keva Darty,HILLARY 05/28/2017, 1:15 PM

## 2017-05-28 NOTE — Discharge Summary (Signed)
Physician Discharge Summary  Sarah Bean DOB: Jun 09, 1966 DOA: 05/25/2017  PCP: [facility]  Admit date: 05/25/2017 Discharge date: 05/28/2017   Recommendations for Outpatient Follow-Up:   1. Follow thrombotic work up-- pending labs 2. FLP/LFTs follow up   Discharge Diagnosis:   Principal Problem:   CVA (cerebral vascular accident) [facility]) Active Problems:   Essential hypertension   Hyperglycemia   Microcytic anemia   Discharge disposition:  Home.   Discharge Condition: Improved.  Diet recommendation: Low sodium, heart healthy.  Wound care: None.   History of Present Illness:   Sarah Bean is a 51 y.o. female with medical history significant of restless legs; obesity; bipolar DO; insomnia; and HTN presenting with neurologic symptoms concerning for TIA/CVA.  Yesterday she was at a family reunion when she developed right-sided face and hand numbness.  When she looked at people it looked like the room was spinning and she had diplopia.  The symptoms improved some but recurred.  Today about 6pm, once again her face started getting very numb.  Stood up and she almost fell twice. She felt dizzy and then her legs got weak and so she sat down.  She called her daughter and couldn't talk because of slurred speech.  Daughter told her if she was slurring it could be a stroke and so she came in.  No vision changes today, but blurry vision yesterday.  Posterior headache.  No dysphagia.  Hospital Course by Problem:   small left subcortical infarct -patient not cooperative with MRI -Carotid Doppler: B ICA 1-39% stenosis, VAs antegrade 2D Echo: - Left ventricle: The cavity size was normal. Systolic function was   normal. The estimated ejection fraction was in the range of 60%   to 65%. Wall motion was normal; there were no regional wall   motion abnormalities. Left ventricular diastolic function   parameters were normal. - Aortic valve: There  was trivial regurgitation. - Mitral valve: Calcified annulus. - Pulmonary arteries: PA peak pressure: 31 mm Hg (S). - LDL: 75 HgbA1c: 5.5 -ASA 325 mg  HTN -normal range  HLD -statin  Obesity Body mass index is 44.63 kg/m.   Fe def anemia -outpatient follow up -Fe PO    Medical Consultants:    Neuro   Discharge Exam:   Vitals:   05/28/17 0059 05/28/17 0500  BP: 122/72 111/67  Pulse: 89 86  Resp: 20 20  Temp: 98.6 F (37 C) 98 F (36.7 C)   Vitals:   05/27/17 1808 05/27/17 2111 05/28/17 0059 05/28/17 0500  BP: (!) 142/88 (!) 144/78 122/72 111/67  Pulse: 91 86 89 86  Resp: 19 20 20 20   Temp: 98.9 F (37.2 C) 98.6 F (37 C) 98.6 F (37 C) 98 F (36.7 C)  TempSrc: Oral Oral Oral Oral  SpO2: 99% 95% 93% 100%  Weight:      Height:        Gen:  NAD    The results of significant diagnostics from this hospitalization (including imaging, microbiology, ancillary and laboratory) are listed below for reference.     Procedures and Diagnostic Studies:   Dg Chest 2 View  Result Date: 05/26/2017 CLINICAL DATA:  Acute onset of transient ischemic attack. Initial encounter. EXAM: CHEST  2 VIEW COMPARISON:  Chest radiograph performed 08/28/2015 FINDINGS: The lungs are well-aerated. Mild peribronchial thickening is noted. There is no evidence of focal opacification, pleural effusion or pneumothorax. The heart is normal in size; the mediastinal  contour is within normal limits. No acute osseous abnormalities are seen. IMPRESSION: Mild peribronchial thickening noted.  Lungs otherwise grossly clear. Electronically Signed   By: Garald Balding M.D.   On: 05/26/2017 04:36   Ct Head Wo Contrast  Result Date: 05/25/2017 CLINICAL DATA:  Right-sided numbness and slurred speech onset yesterday. EXAM: CT HEAD WITHOUT CONTRAST TECHNIQUE: Contiguous axial images were obtained from the base of the skull through the vertex without intravenous contrast. COMPARISON:  11/02/2011 FINDINGS:  Brain: Small left occipital lobe nonhemorrhagic infarct, new since 2013 suspicious for a recent acute or subacute infarct. No hydrocephalus, intra-axial mass nor extra-axial collections. No large vascular territory infarcts. Fourth ventricle and basal cisterns are midline without effacement. Vascular: No hyperdense vessel or unexpected calcification. Skull: Negative for fracture or focal lesion. Sinuses/Orbits: No acute finding. Other: None. IMPRESSION: Small left occipital lobe nonhemorrhagic, recent appearing infarct. Electronically Signed   By: Ashley Royalty M.D.   On: 05/25/2017 19:57     Labs:   Basic Metabolic Panel:  Recent Labs Lab 05/25/17 1914 05/25/17 1921 05/28/17 0411  NA 137 141 136  K 3.4* 3.5 4.1  CL 106 104 103  CO2 24  --  27  GLUCOSE 112* 112* 88  BUN 10 9 11   CREATININE 0.85 0.80 0.72  CALCIUM 8.8*  --  9.1   GFR Estimated Creatinine Clearance: 107.4 mL/min (by C-G formula based on SCr of 0.72 mg/dL). Liver Function Tests:  Recent Labs Lab 05/25/17 1914  AST 26  ALT 23  ALKPHOS 70  BILITOT 0.1*  PROT 6.5  ALBUMIN 3.2*   No results for input(s): LIPASE, AMYLASE in the last 168 hours. No results for input(s): AMMONIA in the last 168 hours. Coagulation profile  Recent Labs Lab 05/25/17 1914  INR 1.00    CBC:  Recent Labs Lab 05/25/17 1914 05/25/17 1921 05/28/17 0411  WBC 11.1*  --  9.4  NEUTROABS 8.2*  --   --   HGB 8.4* 9.9* 8.2*  HCT 27.8* 29.0* 28.0*  MCV 66.3*  --  67.0*  PLT 429*  --  438*   Cardiac Enzymes:  Recent Labs Lab 05/25/17 2355 05/26/17 0319 05/26/17 1049  TROPONINI <0.03 <0.03 <0.03   BNP: Invalid input(s): POCBNP CBG:  Recent Labs Lab 05/25/17 1913  GLUCAP 109*   D-Dimer No results for input(s): DDIMER in the last 72 hours. Hgb A1c  Recent Labs  05/26/17 0319  HGBA1C 5.5   Lipid Profile  Recent Labs  05/26/17 0319  CHOL 135  HDL 54  LDLCALC 75  TRIG 29  CHOLHDL 2.5   Thyroid function  studies  Recent Labs  05/26/17 0319  TSH 3.191   Anemia work up No results for input(s): VITAMINB12, FOLATE, FERRITIN, TIBC, IRON, RETICCTPCT in the last 72 hours. Microbiology No results found for this or any previous visit (from the past 240 hour(s)).   Discharge Instructions:   Discharge Instructions    Ambulatory referral to Neurology    Complete by:  As directed    An appointment is requested in approximately: 6 weeks   Diet - low sodium heart healthy    Complete by:  As directed    Increase activity slowly    Complete by:  As directed      Allergies as of 05/28/2017   No Known Allergies     Medication List    STOP taking these medications   cyclobenzaprine 5 MG tablet Commonly known as:  FLEXERIL   nitrofurantoin (  macrocrystal-monohydrate) 100 MG capsule Commonly known as:  MACROBID     TAKE these medications   albuterol 108 (90 Base) MCG/ACT inhaler Commonly known as:  PROVENTIL HFA;VENTOLIN HFA Inhale 1-2 puffs into the lungs every 4 (four) hours as needed for wheezing or shortness of breath.   aspirin 325 MG tablet Take 1 tablet (325 mg total) by mouth daily.   atorvastatin 40 MG tablet Commonly known as:  LIPITOR Take 1 tablet (40 mg total) by mouth daily at 6 PM.   lisinopril-hydrochlorothiazide 20-25 MG tablet Commonly known as:  PRINZIDE,ZESTORETIC [name]   MIDOL COMPLETE PO Take 2 tablets by mouth every 8 (eight) hours as needed (for pain).   QUEtiapine 300 MG tablet Commonly known as:  SEROQUEL Take 300 mg by mouth at bedtime.   rOPINIRole 0.25 MG tablet Commonly known as:  REQUIP Take 0.25 mg by mouth at bedtime.   traZODone 150 MG tablet Commonly known as:  DESYREL Take 150 mg by mouth at bedtime.         Time coordinating discharge: 35 min  Signed:  Talyia Allende U Nain Rudd   [facility] 05/28/2017, 8:41 AM

## 2017-05-28 NOTE — Progress Notes (Signed)
PT Cancellation Note  Patient Details Name: Sarah Bean MRN: [id number] DOB: 11-06-1965   Cancelled Treatment:    Reason Eval/Treat Not Completed: Other (comment).  Pt has d/c orders in and PT checked in with pt to see if she felt she needed to work with [place] today (specifically on stairs as that was a concern).  Pt reports she feels confident she can do stairs safely and that she is just waiting on her ride (daughter) to get here to go home.  PT to follow acutely until d/c confirmed.    Thanks,    Barbarann Ehlers. [place], [place], DPT [phone]   05/28/2017, 12:58 PM

## 2017-05-28 NOTE — Progress Notes (Signed)
STROKE TEAM PROGRESS NOTE   HISTORY OF PRESENT ILLNESS (per record)  HPI: Sarah Bean is an 51 y.o. female with a history of hypertension, obesity and bipolar affective disorder presenting with numbness involving right side of her face and right upper extremity of sudden onset. Initial symptoms occurred yesterday and lasted for about 1 hour. Current symptoms started at 6:00 PM on 05/25/2017 and have persisted. She had slurred speech on 05/25/2017, which has resolved. Numbness has persisted.  The patient also reports vertigo that began about one week ago.  CT scan of her head showed no acute intracranial abnormality. She has not been on antiplatelet therapy daily. NIH stroke score was 1.  LSN: 1800 on 05/25/2017  Patient was not administered IV t-PA secondary to low NIHSS/minimal deficits on arrival. She was admitted   for further evaluation and treatment.   SUBJECTIVE (INTERVAL HISTORY) Her husband is at the bedside.  The patient is awake, alert, and follows all commands appropriately.She wasm unable to tolerate MRI even with sedation   OBJECTIVE Temp:  [98 F (36.7 C)-98.6 F (37 C)] 98.2 F (36.8 C) (08/08 1200) Pulse Rate:  [86-93] 93 (08/08 1200) Cardiac Rhythm: Normal sinus rhythm (08/08 1200) Resp:  [18-20] 18 (08/08 1200) BP: (111-144)/(67-87) 129/87 (08/08 1200) SpO2:  [93 %-100 %] 100 % (08/08 1200)  CBC:   Recent Labs Lab 05/25/17 1914 05/25/17 1921 05/28/17 0411  WBC 11.1*  --  9.4  NEUTROABS 8.2*  --   --   HGB 8.4* 9.9* 8.2*  HCT 27.8* 29.0* 28.0*  MCV 66.3*  --  67.0*  PLT 429*  --  438*    Basic Metabolic Panel:   Recent Labs Lab 05/25/17 1914 05/25/17 1921 05/28/17 0411  NA 137 141 136  K 3.4* 3.5 4.1  CL 106 104 103  CO2 24  --  27  GLUCOSE 112* 112* 88  BUN 10 9 11   CREATININE 0.85 0.80 0.72  CALCIUM 8.8*  --  9.1    Lipid Panel:     Component Value Date/Time   CHOL 135 05/26/2017 0319   TRIG 29 05/26/2017 0319   HDL 54 05/26/2017 0319    CHOLHDL 2.5 05/26/2017 0319   VLDL 6 05/26/2017 0319   LDLCALC 75 05/26/2017 0319   HgbA1c:  Lab Results  Component Value Date   HGBA1C 5.5 05/26/2017   Urine Drug Screen:     Component Value Date/Time   LABOPIA NONE DETECTED 05/25/2017 2304   COCAINSCRNUR NONE DETECTED 05/25/2017 2304   LABBENZ POSITIVE (A) 05/25/2017 2304   AMPHETMU NONE DETECTED 05/25/2017 2304   THCU NONE DETECTED 05/25/2017 2304   LABBARB NONE DETECTED 05/25/2017 2304    Alcohol Level No results found for: [facility]  IMAGING  Dg Chest 2 View 05/26/2017 IMPRESSION: Mild peribronchial thickening noted.  Lungs otherwise grossly clear.  Ct Head Wo Contrast 05/25/2017 IMPRESSION: Small left occipital lobe nonhemorrhagic, recent appearing infarct.  TTE 05/26/2017 Left ventricle: The cavity size was normal. Systolic function was   normal. The estimated ejection fraction was in the range of 60%   to 65%. Wall motion was normal; there were no regional wall   motion abnormalities. Left ventricular diastolic function   parameters were normal. Carotid US 05/26/2017 Summary: - The vertebral arteries appear patent with antegrade flow. - Findings consistent with a 1- 93 percent stenosis involving the right internal carotid artery and the left internal carotid artery.     PHYSICAL EXAM Obese middle aged african [place]  lady not in distress. . Afebrile. Head is nontraumatic. Neck is supple without bruit.    Cardiac exam no murmur or gallop. Lungs are clear to auscultation. Distal pulses are well felt. Neurological Exam ;  Awake  Alert oriented x 3. Normal speech and language.eye movements full without nystagmus.fundi were not visualized. Vision acuity and fields appear normal. Hearing is normal. Palatal movements are normal. Face symmetric. Tongue midline. Normal strength, tone, reflexes and coordination. Normal sensation. Gait deferred.  ASSESSMENT/PLAN [name] is a 51 y.o. female with history of  hypertension, obesity and bipolar affective disorder presenting with numbness involving right side of her face and right upper extremity of sudden onset. She did not receive IV t-PA due to low NIHSS/minimal deficits on arrival.   Suspect small left subcortical infarct- pt unable to tolerate MRI  Resultant  Mild right sided numbness  CT head: Small left occipital lobe nonhemorrhagic, recent appearing infarct.  MRI head:   Ordered unable to tolerate  MRA head:  ordered  Carotid Doppler: B ICA 1-39% stenosis, VAs antegrade  2D Echo   Normal EF  LDL 75  HgbA1c 5.5  Lovenox 40 mg sq daily for VTE prophylaxis Diet Heart Room service appropriate? Yes; Fluid consistency: Thin Diet - low sodium heart healthy  No antithrombotic prior to admission, now on aspirin 325 mg daily  Patient counseled to be compliant with her antithrombotic medications  Ongoing aggressive stroke risk factor management  Therapy recommendations:   outpt OT  Disposition:  home  Hypertension  Stable  Permissive hypertension (OK if < 220/120) but gradually normalize in 5-7 days  Long-term BP goal normotensive  Hyperlipidemia  Home meds:  none  LDL 74, goal < 70  Add atorvastatin 40mg  PO daily  Continue statin at discharge  Diabetes  HgbA1c  5.5 goal < 7.0  Controlled  Other Stroke Risk Factors  Obesity, Body mass index is 44.63 kg/m., recommend weight loss, diet and exercise as appropriate  Other Active Problems  Iron deficiency anemia  Hospital day # 2 I have personally examined this patient, reviewed notes, independently viewed imaging studies, participated in medical decision making and plan of care.ROS completed by me personally and pertinent positives fully documented  I have made any additions or clarifications directly to the above note. She has presented with subjective numbness possibly from a small subcortical infarct. She could not tolerate MRI even after   sedation. I had a long  discussion the patient and her boyfriend and answered questions about stroke evaluation and treatment.  DC home on aspirin. D/W Dr Eliseo Squires. Stroke team will sign off. Call for questions.  Antony Contras, MD Medical Director [facility] Pager: [phone] 05/28/2017 6:19 PM   To contact Stroke Continuity provider, please refer to [email or web address] After hours, contact General Neurology

## 2017-05-28 NOTE — Progress Notes (Signed)
Pt discharged at this time with daughter taking all personal belongings. IV discontinued, dry dressing applied. Discharge instructions provided along with prescriptions with verbal understanding. Pt will follow up with Md per dc summary. No noted distress.

## 2017-05-28 NOTE — Progress Notes (Signed)
Pt states that she has no ride home at this time. Pt states that her daughter will be taking her home this afternoon when she gets off work.

## 2017-06-02 ENCOUNTER — Other Ambulatory Visit: Payer: Self-pay

## 2017-06-02 LAB — PROTHROMBIN GENE MUTATION

## 2017-06-02 LAB — FACTOR 5 LEIDEN

## 2017-06-02 NOTE — Patient Outreach (Signed)
[facility]) Care Management  06/02/2017  KESHAUNA DEGRAFFENREID 06/11/1966 [id number]  EMMI: stroke Referral date: 06/02/17 Referral source: EMMI stroke red alert Referral reason: Scheduled a follow-up appointment? NO , Filled new prescriptions? NO Day # 1  Telephone call to patient regarding EMMI stroke red alert. HIPAA verified with patient. Discussed EMMI stroke program with patient. Patient states she has not called and scheduled her doctors appointment yet. Patient states she will call today. Patient states she is aware she has to schedule appointment with the neurologist as well. Patient states she does not have her blood pressure medication patient states she ran out.  States she is aware she needs to get medication refilled. Patient request call back due to being in a restaurant at time of call with RNCM.   PLAN: RNCM will attempt 2nd telephone outreach to patient within 3 business days.   Quinn Plowman RN,BSN,CCM [facility] Telephonic  [phone]

## 2017-06-03 ENCOUNTER — Other Ambulatory Visit: Payer: Self-pay

## 2017-06-03 NOTE — Patient Outreach (Signed)
[facility]) Care Management  06/03/2017  Sarah Bean 1965-11-20 [id number]   EMMI: stroke Referral date: 06/02/17 Referral source: EMMI stroke red alert Referral reason: Scheduled a follow-up appointment? NO , Filled new prescriptions? NO Day # 1  Telephone call to patient regarding EMMI stroke follow up. HIPAA verified with patient. Patient denies having any symptoms. Patient states she is doing well. Patient reports she has not scheduled her appointment with her primary doctor. RNCM reinforced with patient importance of scheduling follow up appointment with her primary MD and neurologist. Patient states she has [employer] services for transportation to her appointments. Patient states she has all of her medications except for her blood pressure medicine. Patient states she will call her pharmacy today for refill.  RNCM discussed signs/ symptoms of stroke with patient. Advised patient to call 911 for stroke like symptoms.  RNCM discussed low salt/ heart healthy diet with patient.  Advised patient to keep follow up appointments and take medications as prescribed.  Patient verbally agreed to receive EMMI education material on stroke. Patient requested Advance directive packet.  Patient denies any further needs at this time.   PLAN; RNCM will refer patient to care management assistant to close due to patient being assessed and having no further needs.  RNCM will send patient EMMI education material on stroke, low salt / heart healthy diet, and Advance directive.   Quinn Plowman RN,BSN,CCM [facility] Telephonic  [phone]

## 2017-06-27 ENCOUNTER — Emergency Department (HOSPITAL_COMMUNITY)
Admission: EM | Admit: 2017-06-27 | Discharge: 2017-06-27 | Disposition: A | Discharge status: Home / Self Care | Payer: Medicaid Other | Attending: Emergency Medicine | Admitting: Emergency Medicine

## 2017-06-27 ENCOUNTER — Emergency Department (HOSPITAL_COMMUNITY): Payer: Medicaid Other

## 2017-06-27 ENCOUNTER — Ambulatory Visit (HOSPITAL_COMMUNITY)
Admission: EM | Admit: 2017-06-27 | Discharge: 2017-06-27 | Disposition: A | Discharge status: Home / Self Care | Payer: Medicaid Other

## 2017-06-27 ENCOUNTER — Encounter (HOSPITAL_COMMUNITY): Payer: Self-pay | Admitting: *Deleted

## 2017-06-27 DIAGNOSIS — Z79899 Other long term (current) drug therapy: Secondary | ICD-10-CM | POA: Diagnosis not present

## 2017-06-27 DIAGNOSIS — H539 Unspecified visual disturbance: Secondary | ICD-10-CM

## 2017-06-27 DIAGNOSIS — I10 Essential (primary) hypertension: Secondary | ICD-10-CM | POA: Insufficient documentation

## 2017-06-27 DIAGNOSIS — H538 Other visual disturbances: Secondary | ICD-10-CM | POA: Insufficient documentation

## 2017-06-27 DIAGNOSIS — Z7982 Long term (current) use of aspirin: Secondary | ICD-10-CM | POA: Insufficient documentation

## 2017-06-27 NOTE — Discharge Instructions (Signed)
Please read and follow all provided instructions.  Your diagnoses today include:  1. Visual disturbance     Tests performed today include:  CT scan - shows no change in left-sided stroke  Vital signs. See below for your results today.   Medications prescribed:   None  Take any prescribed medications only as directed.  Home care instructions:  Follow any educational materials contained in this packet.  BE VERY CAREFUL not to take multiple medicines containing Tylenol (also called acetaminophen). Doing so can lead to an overdose which can damage your liver and cause liver failure and possibly death.   Follow-up instructions: Please follow-up with the eye specialist in the next 3 days for further evaluation of your symptoms.   Return instructions:   Please return to the Emergency Department if you experience worsening symptoms.   Return if you have weakness in your arms or legs, slurred speech, trouble walking or talking, confusion, or trouble with your balance.   Please return if you have any other emergent concerns.  Additional Information:  Your vital signs today were: BP 122/68 (BP Location: Right Arm)    Pulse 81    Temp 98.7 F (37.1 C) (Oral)    Resp 18    LMP 05/25/2017    SpO2 98%  If your blood pressure (BP) was elevated above 135/85 this visit, please have this repeated by your doctor within one month. --------------

## 2017-06-27 NOTE — ED Provider Notes (Signed)
[facility] DEPT Provider Note   CSN: [id number] Arrival date & time: 06/27/17  1040     History   Chief Complaint Chief Complaint  Patient presents with  . Decreased Visual Acuity    HPI Sarah Bean is a 51 y.o. female.  Patient with history of left-sided occipital stroke in 05/2017 -- presents with approximately 10 day history of visual disturbance. Patient notes that she has flashes of color in her vision which lasted for approximately 1 second, multiple times a day. This has been ongoing for 10 days. She is unable to tell if this is occurring in her left eye, right eye, or both eyes. She has no eye pain or history of recent trauma. Patient denies signs of stroke including: facial droop, slurred speech, aphasia, weakness/numbness in extremities, imbalance/trouble walking. No headache or neck pain.         Past Medical History:  Diagnosis Date  . Essential hypertension   . Essential hypertension 05/26/2017  . Insomnia   . Manic depression ([place])   . Obesity   . Restless leg syndrome     Patient Active Problem List   Diagnosis Date Noted  . Essential hypertension 05/26/2017  . Hyperglycemia 05/26/2017  . Microcytic anemia 05/26/2017  . CVA (cerebral vascular accident) ([place]) 05/25/2017    Past Surgical History:  Procedure Laterality Date  . BREAST LUMPECTOMY Left   . CESAREAN SECTION     x3    OB History    No data available       Home Medications    Prior to Admission medications   Medication Sig Start Date End Date Taking? Authorizing Provider  Acetaminophen-Caff-Pyrilamine (MIDOL COMPLETE PO) Take 2 tablets by mouth every 8 (eight) hours as needed (for pain).    [provider]  albuterol (PROVENTIL HFA;VENTOLIN HFA) 108 (90 BASE) MCG/ACT inhaler Inhale 1-2 puffs into the lungs every 4 (four) hours as needed for wheezing or shortness of breath. 11/28/13   Liam Graham, PA-C  aspirin 325 MG tablet Take 1 tablet (325 mg total) by mouth  daily. 05/28/17   Geradine Girt, DO  atorvastatin (LIPITOR) 40 MG tablet Take 1 tablet (40 mg total) by mouth daily at 6 PM. 05/28/17   Geradine Girt, DO  lisinopril-hydrochlorothiazide (PRINZIDE,ZESTORETIC) 20-25 MG tablet [name]    [provider]  QUEtiapine (SEROQUEL) 300 MG tablet Take 300 mg by mouth at bedtime.     [provider]  rOPINIRole (REQUIP) 0.25 MG tablet Take 0.25 mg by mouth at bedtime.    [provider]  traZODone (DESYREL) 150 MG tablet Take 150 mg by mouth at bedtime.     [provider]    Family History Family History  Problem Relation Age of Onset  . CAD Mother 7  . Other Father 38       struck by lightning  . Stroke Neg Hx     Social History Social History  Substance Use Topics  . Smoking status: Never Smoker  . Smokeless tobacco: Never Used  . Alcohol use No     Allergies   Patient has no known allergies.   Review of Systems Review of Systems  Constitutional: Negative for fever.  HENT: Negative for congestion, dental problem, rhinorrhea and sinus pressure.   Eyes: Positive for visual disturbance. Negative for photophobia, discharge and redness.  Respiratory: Negative for shortness of breath.   Cardiovascular: Negative for chest pain.  Gastrointestinal: Negative for  nausea and vomiting.  Musculoskeletal: Negative for gait problem, neck pain and neck stiffness.  Skin: Negative for rash.  Neurological: Negative for syncope, speech difficulty, weakness, light-headedness, numbness and headaches.  Psychiatric/Behavioral: Negative for confusion.     Physical Exam Updated Vital Signs BP 111/66 (BP Location: Right Arm)   Pulse (!) 101   Temp 98.7 F (37.1 C) (Oral)   Resp 18   LMP 05/25/2017   SpO2 99%   Physical Exam  Constitutional: She is oriented to person, place, and time. She appears well-developed and well-nourished.  HENT:  Head: Normocephalic and atraumatic.  Right Ear:  Tympanic membrane, external ear and ear canal normal.  Left Ear: Tympanic membrane, external ear and ear canal normal.  Nose: Nose normal.  Mouth/Throat: Uvula is midline, oropharynx is clear and moist and mucous membranes are normal.  Eyes: Conjunctivae, EOM and lids are normal. Right eye exhibits no discharge. Left eye exhibits no discharge. Right conjunctiva is not injected. Left conjunctiva is not injected. Right eye exhibits no nystagmus. Left eye exhibits no nystagmus. Right pupil is round and reactive. Left pupil is round and reactive. Pupils are equal.  Neck: Normal range of motion. Neck supple.  Cardiovascular: Normal rate and regular rhythm.   Pulmonary/Chest: Effort normal and breath sounds normal.  Abdominal: Soft. There is no tenderness.  Musculoskeletal:       Cervical back: She exhibits normal range of motion, no tenderness and no bony tenderness.  Neurological: She is alert and oriented to person, place, and time. She has normal strength and normal reflexes. No cranial nerve deficit or sensory deficit. She displays a negative Romberg sign. Coordination and gait normal. GCS eye subscore is 4. GCS verbal subscore is 5. GCS motor subscore is 6.  Skin: Skin is warm and dry.  Psychiatric: She has a normal mood and affect.  Nursing note and vitals reviewed.    ED Treatments / Results  Labs (all labs ordered are listed, but only abnormal results are displayed) Labs Reviewed - No data to display  EKG  EKG Interpretation None       Radiology Ct Head Wo Contrast  Result Date: 06/27/2017 CLINICAL DATA:  Bilateral vision changes. EXAM: CT HEAD WITHOUT CONTRAST TECHNIQUE: Contiguous axial images were obtained from the base of the skull through the vertex without intravenous contrast. COMPARISON:  05/25/2017 FINDINGS: Brain: Area of low density again noted in the left occipital lobe compatible with old infarct. No acute intracranial abnormality. Specifically, no hemorrhage,  hydrocephalus, mass lesion, acute infarction, or significant intracranial injury. Vascular: No hyperdense vessel or unexpected calcification. Skull: No acute calvarial abnormality. Sinuses/Orbits: Visualized paranasal sinuses and mastoids clear. Orbital soft tissues unremarkable. Other: None IMPRESSION: Old left occipital infarct.  No acute intracranial abnormality. Electronically Signed   By: Sarah Bean M.D.   On: 06/27/2017 11:48    Procedures Procedures (including critical care time)  Medications Ordered in ED Medications - No data to display   Initial Impression / Assessment and Plan / ED Course  I have reviewed the triage vital signs and the nursing notes.  Pertinent labs & imaging results that were available during my care of the patient were reviewed by me and considered in my medical decision making (see chart for details).     Patient seen and examined. Reviewed previous hospitalization. CT today shows old infarct only, no bleeding. Pt updated.   Vital signs reviewed and are as follows: BP 111/66 (BP Location: Right Arm)   Pulse (!) 101  Temp 98.7 F (37.1 C) (Oral)   Resp 18   LMP 05/25/2017   SpO2 99%      Visual Acuity  Right Eye Distance: 20/160 Left Eye Distance: 20/160 Bilateral Distance: 20/160  Right Eye Near:   Left Eye Near:    Bilateral Near:      Discussed patient with Dr. Verner Chol who has seen. Agree symptoms would be very unusual for acute stroke given their short duration and frequency. Also, symptoms have been ongoing for 10 days.  Low concern for retinal detachment. Dr. Regenia Skeeter was able to get patient to localize her symptoms moreso and performed ocular ultrasound which did not show any obvious detachment.   At this point, plan is to have patient follow-up with ophthalmology to rule out ocular issue. She has follow-up with neurology in 2 weeks.   Patient counseled to return if they have weakness in their arms or legs, slurred speech, trouble  walking or talking, confusion, trouble with their balance, or if they have any other concerns. Patient verbalizes understanding and agrees with plan.     Final Clinical Impressions(s) / ED Diagnoses   Final diagnoses:  Visual disturbance   Patient with transient, intermittent visual disturbance described as splashes of color in all visual fields. These are not persistent and are not consistent with homonymous hemianopsia. Doubt acute stroke. Symptoms ongoing for 10 days. She did have a stroke which may involve visual centers of brain. Symptoms would not be typical for acute rectal detachment. No vision loss to suggest central retinal artery occlusion.   New Prescriptions Discharge Medication List as of 06/27/2017  1:14 PM       Carlisle Cater, PA-C 06/27/17 [place], MD 07/03/17 [id number]

## 2017-06-27 NOTE — ED Triage Notes (Signed)
Pt reports being admitted on 8/5 for TIA. Pt reports change in vision for over the past week. Reports it is cloudy with black areas. No unilateral weakness or neuro symptoms noted at triage.

## 2017-06-27 NOTE — ED Provider Notes (Signed)
Medical screening examination/treatment/procedure(s) were conducted as a shared visit with non-physician practitioner(s) and myself.  I personally evaluated the patient during the encounter.   EKG Interpretation None       Briefly, patient has on-and-off visual floaters in her right superior lateral visual field. She endorses that this is not painful and does not occur if she closes her right eye. No left-sided symptoms. This is not some leg stroke. Has now been going on for about 2 weeks on and off. No obvious retinal detachment on ultrasound by myself. She will need follow-up with ophthalmology.  EMERGENCY DEPARTMENT [place] OCULAR EXAM "Study: Limited Ultrasound of Orbit "  INDICATIONS: Floaters/Flashes Linear probe utilized to obtain images in both long and short axis of the orbit having the patient look left and right if possible.  PERFORMED BY: Myself IMAGES ARCHIVED?: Yes LIMITATIONS: none VIEWS USED: Right orbit INTERPRETATION: No retinal detachment    Sherwood Gambler, MD 06/27/17 1234

## 2017-06-27 NOTE — ED Notes (Signed)
Patient given discharge instructions and verbalized understanding.  Patient stable to discharge at this time.  Patient is alert and oriented to baseline.  No distressed noted at this time.  All belongings taken with the patient at discharge.   

## 2017-07-15 ENCOUNTER — Encounter: Payer: Self-pay | Admitting: Neurology

## 2017-07-15 ENCOUNTER — Ambulatory Visit (INDEPENDENT_AMBULATORY_CARE_PROVIDER_SITE_OTHER): Payer: Medicaid Other | Admitting: Neurology

## 2017-07-15 ENCOUNTER — Telehealth: Payer: Self-pay | Admitting: Neurology

## 2017-07-15 VITALS — BP 116/78 | HR 70 | Ht 63.0 in | Wt 254.0 lb

## 2017-07-15 DIAGNOSIS — R200 Anesthesia of skin: Secondary | ICD-10-CM | POA: Diagnosis not present

## 2017-07-15 NOTE — Addendum Note (Signed)
Addended by: Garvin Fila on: 07/15/2017 07:29 PM   Modules accepted: Orders

## 2017-07-15 NOTE — Patient Instructions (Addendum)
I had a long d/w patient about her recent strokelike episode, risk for recurrent stroke/TIAs, personally independently reviewed imaging studies and stroke evaluation results and answered questions.Continue aspirin 325 mg daily  for secondary stroke prevention and maintain strict control of hypertension with blood pressure goal below 130/90, diabetes with hemoglobin A1c goal below 6.5% and lipids with LDL cholesterol goal below 70 mg/dL. I also advised the patient to eat a healthy diet with plenty of whole grains, cereals, fruits and vegetables, exercise regularly and maintain ideal body weight . Advised patient to see an ophthalmologist for her transient visual obscurations and floaters in her eyes. She'll return for follow-up in the future only as necessary and no scheduled appointment was made. Stroke Prevention Some medical conditions and behaviors are associated with an increased chance of having a stroke. You may prevent a stroke by making healthy choices and managing medical conditions. How can I reduce my risk of having a stroke?  Stay physically active. Get at least 30 minutes of activity on most or all days.  Do not smoke. It may also be helpful to avoid exposure to secondhand smoke.  Limit alcohol use. Moderate alcohol use is considered to be: ? No more than 2 drinks per day for men. ? No more than 1 drink per day for nonpregnant women.  Eat healthy foods. This involves: ? Eating 5 or more servings of fruits and vegetables a day. ? Making dietary changes that address high blood pressure (hypertension), high cholesterol, diabetes, or obesity.  Manage your cholesterol levels. ? Making food choices that are high in fiber and low in saturated fat, trans fat, and cholesterol may control cholesterol levels. ? Take any prescribed medicines to control cholesterol as directed by your health care provider.  Manage your diabetes. ? Controlling your carbohydrate and sugar intake is recommended to  manage diabetes. ? Take any prescribed medicines to control diabetes as directed by your health care provider.  Control your hypertension. ? Making food choices that are low in salt (sodium), saturated fat, trans fat, and cholesterol is recommended to manage hypertension. ? Ask your health care provider if you need treatment to lower your blood pressure. Take any prescribed medicines to control hypertension as directed by your health care provider. ? If you are 7-25 years of age, have your blood pressure checked every 3-5 years. If you are 29 years of age or older, have your blood pressure checked every year.  Maintain a healthy weight. ? Reducing calorie intake and making food choices that are low in sodium, saturated fat, trans fat, and cholesterol are recommended to manage weight.  Stop drug abuse.  Avoid taking birth control pills. ? Talk to your health care provider about the risks of taking birth control pills if you are over 44 years old, smoke, get migraines, or have ever had a blood clot.  Get evaluated for sleep disorders (sleep apnea). ? Talk to your health care provider about getting a sleep evaluation if you snore a lot or have excessive sleepiness.  Take medicines only as directed by your health care provider. ? For some people, aspirin or blood thinners (anticoagulants) are helpful in reducing the risk of forming abnormal blood clots that can lead to stroke. If you have the irregular heart rhythm of atrial fibrillation, you should be on a blood thinner unless there is a good reason you cannot take them. ? Understand all your medicine instructions.  Make sure that other conditions (such as anemia or atherosclerosis) are  addressed. Get help right away if:  You have sudden weakness or numbness of the face, arm, or leg, especially on one side of the body.  Your face or eyelid droops to one side.  You have sudden confusion.  You have trouble speaking (aphasia) or  understanding.  You have sudden trouble seeing in one or both eyes.  You have sudden trouble walking.  You have dizziness.  You have a loss of balance or coordination.  You have a sudden, severe headache with no known cause.  You have new chest pain or an irregular heartbeat. Any of these symptoms may represent a serious problem that is an emergency. Do not wait to see if the symptoms will go away. Get medical help at once. Call your local emergency services (911 in [place] Do not drive yourself to the hospital. This information is not intended to replace advice given to you by your health care provider. Make sure you discuss any questions you have with your health care provider. Document Released: 11/14/2004 Document Revised: 03/14/2016 Document Reviewed: 04/09/2013 Elsevier Interactive Patient Education  2017 [employer].

## 2017-07-15 NOTE — Telephone Encounter (Signed)
I CALLED THE PATIENT`S HOME PHONE AND LEFT MESSAGE ON ANSWERING MACHINE THAT I HAD ORDERED A OPEN mri BRAIN SCAN ON HER SINCE SHE HAD SOME NEW VISION SYMPTOMS AND COULD NOT GET ONE DURING RECENT ADMISSION FOR STROKE. sHE CAN GET SOME ANXIOLYTIC MEDINE FOR SCAN IF SHE WANTS. sHE WAS ASKED TO CALL MY OFFICE WITH QUESTIONS.

## 2017-07-15 NOTE — Progress Notes (Addendum)
[facility] [address] [place]. [place] [address] [phone]       OFFICE FOLLOW-UP NOTE  [name] Date of Birth:  02-07-1966 Medical Record Number:  [id number]   HPI: Ms Bean is a 51 year African-American lady seen today for follow-up for the first time after recent hospital admission for strokelike symptoms. She is accompanied by her son. History is obtained from the patient, review of electronic medical records and have personally reviewed imaging films.Sarah Touch Brownis an 51 y.o.femalewith a history of hypertension, obesity and bipolar affective disorder presenting with numbness involving right side of her face and right upper extremity of sudden onset. Initial symptoms occurred yesterday and lasted for about 1 hour. Current symptoms started at 6:00 PM on 05/25/2017 and have persisted. She had slurred speech on 05/25/2017, which has resolved. Numbness has persisted.  The patient also reports vertigo that began about one week ago.  CT scan of her head showed no acute intracranial abnormality. She has not been on antiplatelet therapy daily. NIH stroke score was 1. LSN:1800 on 05/25/2017.Patient was not administered IV t-PA secondary to low NIHSS/minimal deficits on arrival. She was admitted   for further evaluation and treatment. CT scan of the head showed a small left occipital lobe nonhemorrhagic recent appearing infarct but patient was unable to tolerate MRI scan. Carotid Doppler showed no significant extracranial stenosis. Transthoracic echo showed normal ejection fraction without cardiac source of embolism. LDL cholesterol was elevated at 75 mg percent. Hemoglobin A1c was 5.5. Telemetry monitoring in the hospital did not show any cardiac arrhythmias. Patient had right-sided numbness which appears to have improved with a few days after discharge. She did not have any recurrent symptoms but does complain of some seen spots in front of her eyes which are moving  around more on the right than the left. This has started only 2 weeks ago. She denies any accompanying headache, double vision, she is denies any decreased vision acuity or visual fields and has not been bumping into objects. She has not seen an eye doctor but plans to see one soon. She is tolerating aspirin without bleeding or bruising. She is also on Lipitor 40 mg is tolerating well. She states her blood pressure is well controlled and today it is 116/78. She takes Requip for restless legs which also seems to be well controlled. She plans on eating healthy diet but has not yet lost any weight. I explained to the patient that she probably had a left posterior circulation PCA branch infarct and Ct scan was suggestive  but not conclusive and she was unable to tolerate MRI in the hospital. Repeating an open MRI images confirm the diagnosis but is unlikely to change the treatment plan hence we would not get an MRI as it might make her uncomfortable  ROS:   14 system review of systems is positive for  restless leg, insomnia, vision floaters, blurred vision and all other systems negative PMH:  Past Medical History:  Diagnosis Date  . Asthma   . Essential hypertension   . Essential hypertension 05/26/2017  . Insomnia   . Manic depression ([place])   . Obesity   . Restless leg syndrome   . Stroke [facility])     Social History:  Social History   Social History  . Marital status: Single    Spouse name: N/A  . Number of children: N/A  . Years of education: N/A   Occupational History  . disabled  Social History Main Topics  . Smoking status: Never Smoker  . Smokeless tobacco: Never Used  . Alcohol use No  . Drug use: No  . Sexual activity: Not on file   Other Topics Concern  . Not on file   Social History Narrative   Patient lives at home with her son, who has autism. He vocalizes and acts out.    Medications:   Current Outpatient Prescriptions on File Prior to Visit  Medication Sig Dispense  Refill  . Acetaminophen-Caff-Pyrilamine (MIDOL COMPLETE PO) Take 2 tablets by mouth every 8 (eight) hours as needed (for pain).    Marland Kitchen albuterol (PROVENTIL HFA;VENTOLIN HFA) 108 (90 BASE) MCG/ACT inhaler Inhale 1-2 puffs into the lungs every 4 (four) hours as needed for wheezing or shortness of breath. 1 Inhaler 0  . aspirin 325 MG tablet Take 1 tablet (325 mg total) by mouth daily. 30 tablet 0  . atorvastatin (LIPITOR) 40 MG tablet Take 1 tablet (40 mg total) by mouth daily at 6 PM. 30 tablet 0  . lisinopril-hydrochlorothiazide (PRINZIDE,ZESTORETIC) 20-25 MG tablet [name]    . QUEtiapine (SEROQUEL) 300 MG tablet Take 300 mg by mouth at bedtime.     Marland Kitchen rOPINIRole (REQUIP) 0.25 MG tablet Take 0.25 mg by mouth at bedtime.    . traZODone (DESYREL) 150 MG tablet Take 150 mg by mouth at bedtime.      No current facility-administered medications on file prior to visit.     Allergies:  No Known Allergies  Physical Exam General: Obese middle-aged African-American lady seated, in no evident distress Head: head normocephalic and atraumatic.  Neck: supple with no carotid or supraclavicular bruits Cardiovascular: regular rate and rhythm, no murmurs Musculoskeletal: no deformity Skin:  no rash/petichiae Vascular:  Normal pulses all extremities Vitals:   07/15/17 1515  BP: 116/78  Pulse: 70   Neurologic Exam Mental Status: Awake and fully alert. Oriented to place and time. Recent and remote memory intact. Attention span, concentration and fund of knowledge appropriate. Mood and affect appropriate.  Cranial Nerves: Fundoscopic exam reveals sharp disc margins. Pupils equal, briskly reactive to light. Extraocular movements full without nystagmus. Visual fields full to confrontation. Hearing intact. Facial sensation intact. Face, tongue, palate moves normally and symmetrically.  Motor: Normal bulk and tone. Normal strength in all tested extremity muscles. Sensory.: intact to touch  ,pinprick .position and vibratory sensation.  Coordination: Rapid alternating movements normal in all extremities. Finger-to-nose and heel-to-shin performed accurately bilaterally. Gait and Station: Arises from chair without difficulty. Stance is normal. Gait demonstrates normal stride length and balance . Able to heel, toe and tandem walk without difficulty.  Reflexes: 1+ and symmetric. Toes downgoing.       ASSESSMENT: 51 year old lady with episode of sudden onset right-sided paresthesias in August 2018 for CVA from left posterior cerebral artery branch infarct etiology indeterminate. Patient refused MRI at that time. New complaints of vision and floaters in the right eye of unclear etiology    PLAN: I had a long d/w patient about her recent strokelike episode, risk for recurrent stroke/TIAs, personally independently reviewed imaging studies and stroke evaluation results and answered questions.Continue aspirin 325 mg daily  for secondary stroke prevention and maintain strict control of hypertension with blood pressure goal below 130/90, diabetes with hemoglobin A1c goal below 6.5% and lipids with LDL cholesterol goal below 70 mg/dL. I also advised the patient to eat a healthy diet with plenty of whole grains, cereals, fruits and vegetables, exercise  regularly and maintain ideal body weight . Advised patient to see an ophthalmologist for her transient visual obscurations and floaters in her eyes. I doubt these represent migrainous or ischemic in nature She'll return for follow-up in the future only as necessary and no scheduled appointment was made. Greater than 50% of time during this 25 minute visit was spent on counseling,explanation of diagnosis of paresthesias, left PCA branch infarct, planning of further management, discussion with patient and family and coordination of care ADDENDUM : Upon further review of her CT scan showing illdefined lefft occipital hypodensity at admission for her stroke  I feel f/u imaging is necessary in view of her new compalints of visual disturbances hence will obtain MRI brain on open scanner if she can tolerate otherwise CT head w/wo  Antony Contras, MD  [facility] [address] [place], [place] [address]  Phone [phone] Fax [phone] Note: This document was prepared with digital dictation and possible smart phrase technology. Any transcriptional errors that result from this process are unintentional

## 2017-07-29 ENCOUNTER — Other Ambulatory Visit: Payer: Medicaid Other

## 2017-08-12 ENCOUNTER — Other Ambulatory Visit: Payer: Medicaid Other

## 2017-08-27 ENCOUNTER — Other Ambulatory Visit: Payer: Medicaid Other

## 2017-09-09 ENCOUNTER — Ambulatory Visit
Admission: RE | Admit: 2017-09-09 | Discharge: 2017-09-09 | Disposition: A | Discharge status: Home / Self Care | Payer: Medicaid Other | Source: Ambulatory Visit | Attending: Neurology | Admitting: Neurology

## 2017-09-09 DIAGNOSIS — R200 Anesthesia of skin: Secondary | ICD-10-CM | POA: Diagnosis not present

## 2017-09-09 MED ORDER — GADOBENATE DIMEGLUMINE 529 MG/ML IV SOLN
20.0000 mL | Freq: Once | INTRAVENOUS | Status: AC | PRN
  Administered 2017-09-09: 20 mL via INTRAVENOUS

## 2017-12-04 ENCOUNTER — Ambulatory Visit: Payer: Medicaid Other | Admitting: Neurology

## 2017-12-11 ENCOUNTER — Encounter: Payer: Self-pay | Admitting: Neurology

## 2018-01-15 IMAGING — CT CT HEAD W/O CM
4 series · 17 of 47 positions shown, 19 images · non-contrast
Comparison: 05/25/2017

CLINICAL DATA: Bilateral vision changes.

EXAM:
CT HEAD WITHOUT CONTRAST
TECHNIQUE: Contiguous axial images were obtained from the base of the skull
through the vertex without intravenous contrast.

[Series 3: head without · axial · non-contrast · 0.42mm/px · z∈[-116,+4]mm · 7 of 32 slices shown, 9 images]
[im 4/32  brain]
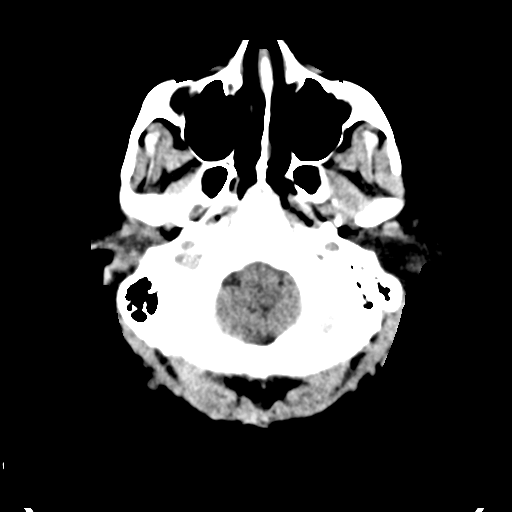
[im 4/32  bone]
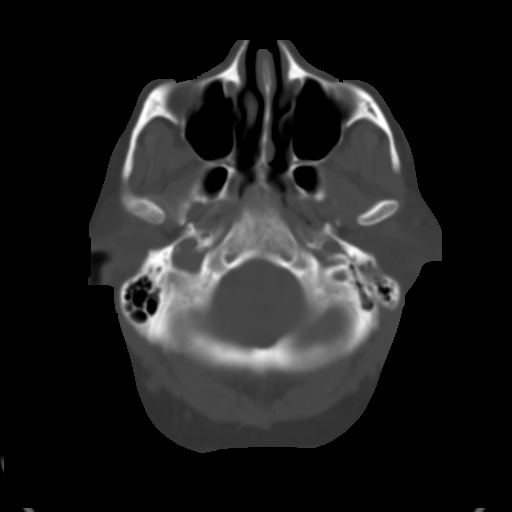
[im 8/32  brain]
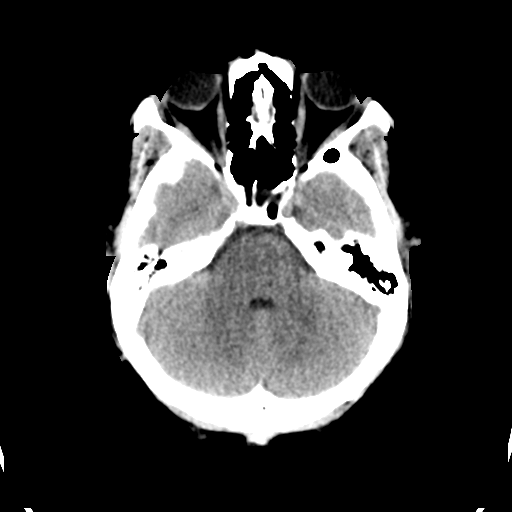
[im 12/32  brain]
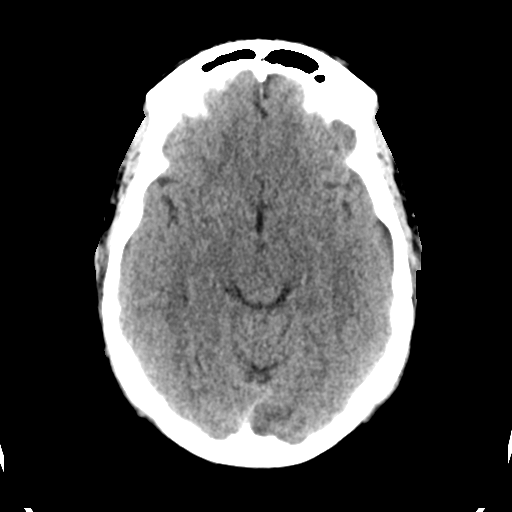
[im 16/32  brain]
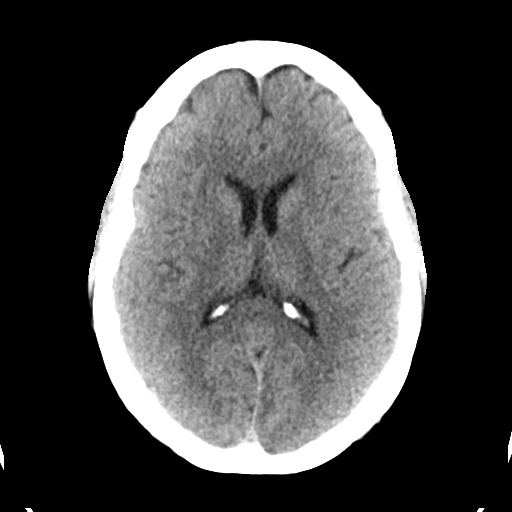
[im 20/32  brain]
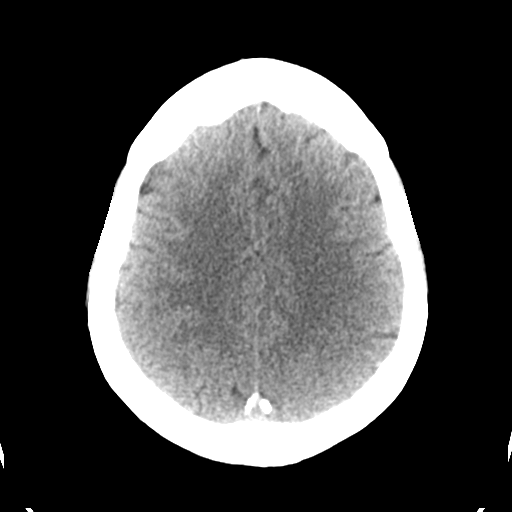
[im 20/32  bone]
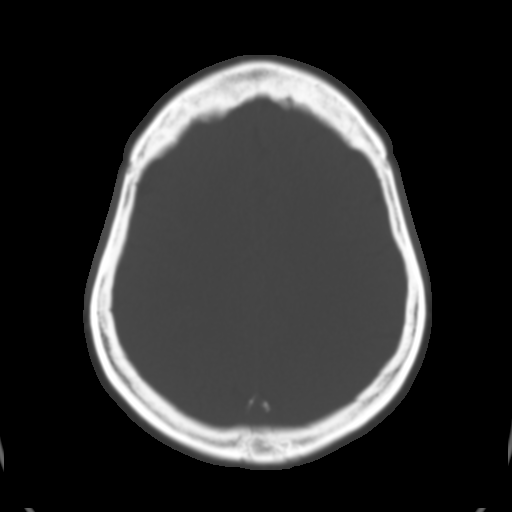
[im 24/32  brain]
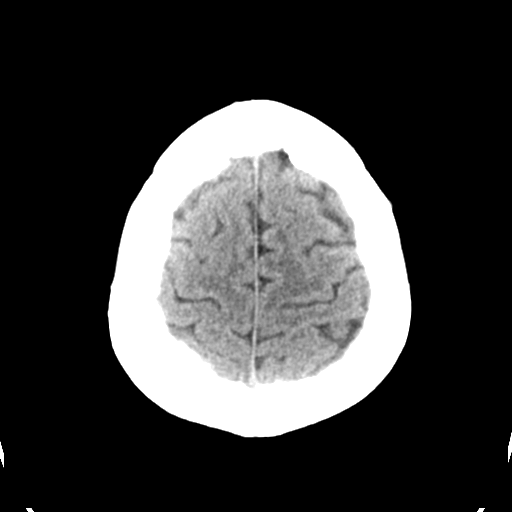
[im 28/32  brain]
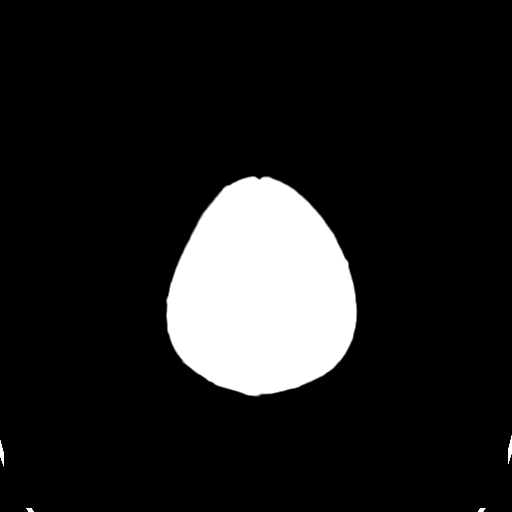

[Series 4: head bone · axial · 0.42mm/px · z∈[-117,-61]mm · 4 of 79 slices shown]
[im 8/79  bone]
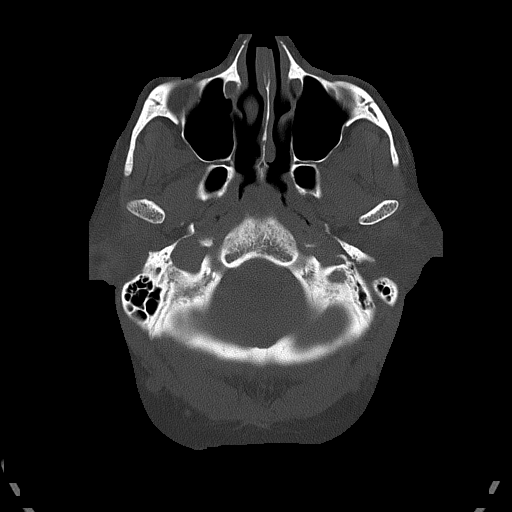
[im 16/79  bone]
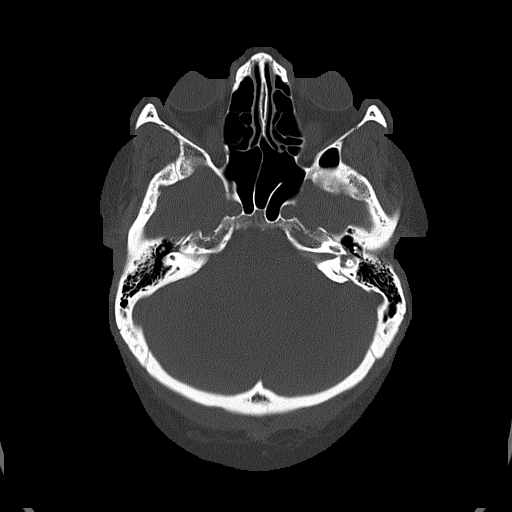
[im 24/79  bone]
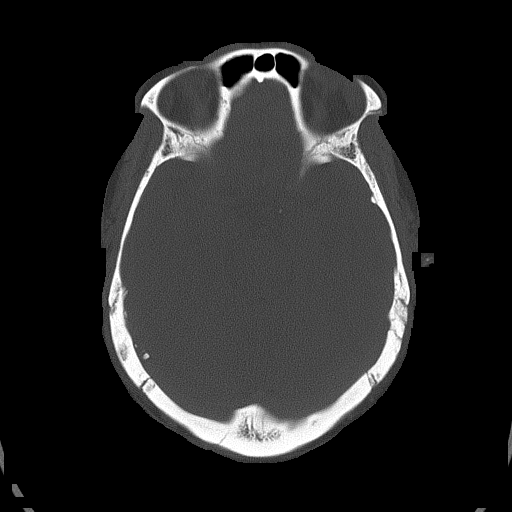
[im 36/79  bone]
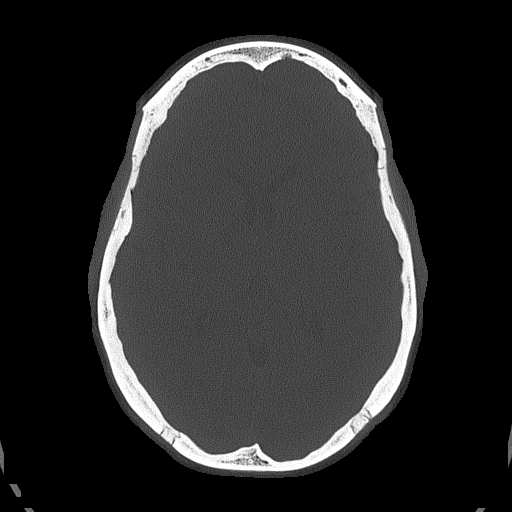

[Series 5: head without cor · coronal · non-contrast · 0.31mm/px · 3 of 69 slices shown]
[im 23/69  brain]
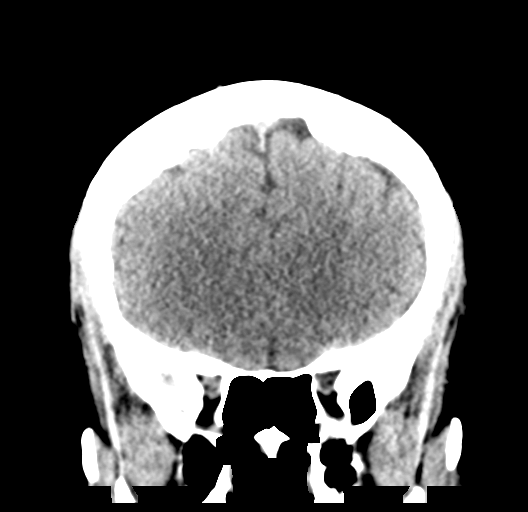
[im 31/69  brain]
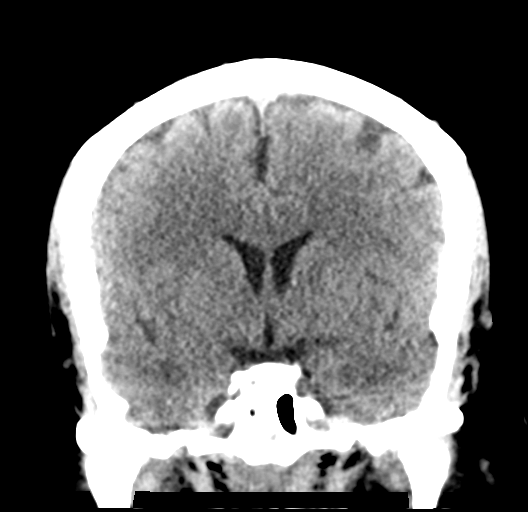
[im 38/69  brain]
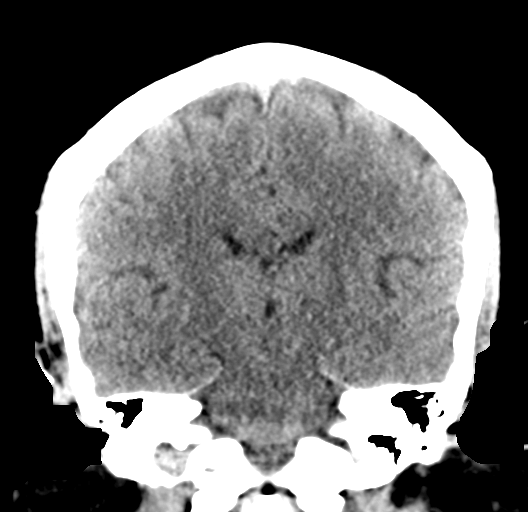

[Series 6: head without sag · sagittal · non-contrast · 0.31mm/px · 3 of 54 slices shown]
[im 18/54  brain]
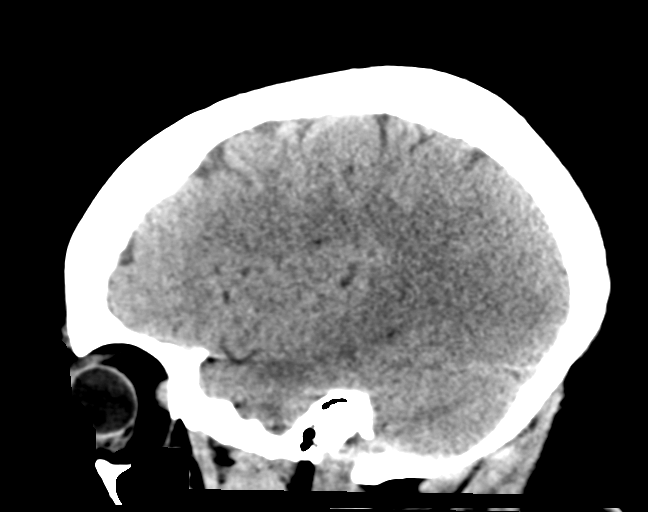
[im 27/54  brain]
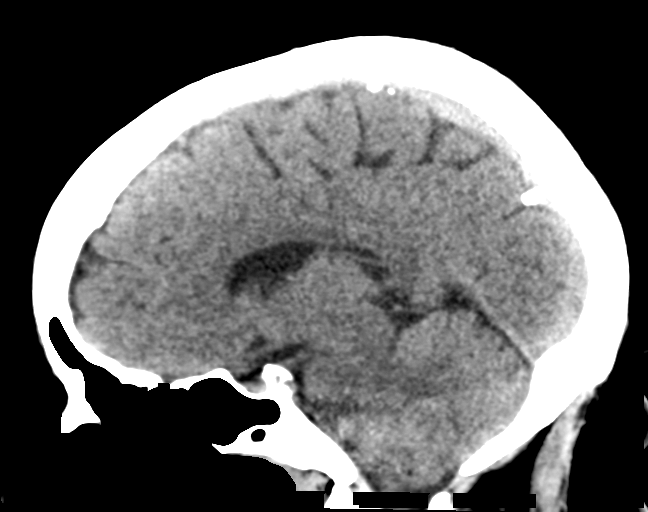
[im 36/54  brain]
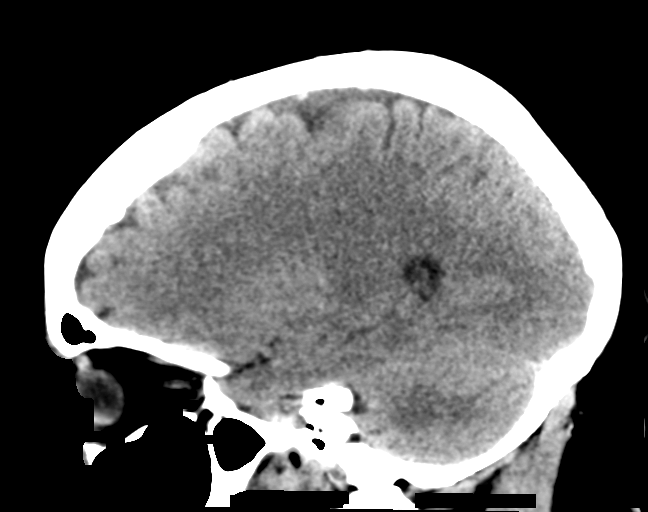

[17 of 47 positions shown; findings below may reference images not displayed]

FINDINGS: Brain: Area of low density again noted in the left occipital lobe
compatible with old infarct. No acute intracranial abnormality.
Specifically, no hemorrhage, hydrocephalus, mass lesion, acute
infarction, or significant intracranial injury.

Vascular: No hyperdense vessel or unexpected calcification.

Skull: No acute calvarial abnormality.

Sinuses/Orbits: Visualized paranasal sinuses and mastoids clear.
Orbital soft tissues unremarkable.

Other: None
IMPRESSION: Old left occipital infarct.  No acute intracranial abnormality.

## 2018-02-10 ENCOUNTER — Ambulatory Visit: Payer: Medicaid Other | Admitting: Neurology

## 2018-02-10 ENCOUNTER — Encounter: Payer: Self-pay | Admitting: Neurology

## 2018-03-04 ENCOUNTER — Encounter (HOSPITAL_COMMUNITY): Payer: Self-pay | Admitting: Emergency Medicine

## 2018-03-04 ENCOUNTER — Other Ambulatory Visit: Payer: Self-pay

## 2018-03-04 ENCOUNTER — Emergency Department (HOSPITAL_COMMUNITY): Payer: Medicaid Other

## 2018-03-04 ENCOUNTER — Emergency Department (HOSPITAL_COMMUNITY)
Admission: EM | Admit: 2018-03-04 | Discharge: 2018-03-04 | Disposition: A | Discharge status: Home / Self Care | Payer: Medicaid Other | Attending: Emergency Medicine | Admitting: Emergency Medicine

## 2018-03-04 DIAGNOSIS — I10 Essential (primary) hypertension: Secondary | ICD-10-CM | POA: Insufficient documentation

## 2018-03-04 DIAGNOSIS — J40 Bronchitis, not specified as acute or chronic: Secondary | ICD-10-CM

## 2018-03-04 DIAGNOSIS — Z8673 Personal history of transient ischemic attack (TIA), and cerebral infarction without residual deficits: Secondary | ICD-10-CM | POA: Insufficient documentation

## 2018-03-04 DIAGNOSIS — R0602 Shortness of breath: Secondary | ICD-10-CM

## 2018-03-04 DIAGNOSIS — R05 Cough: Secondary | ICD-10-CM

## 2018-03-04 DIAGNOSIS — R059 Cough, unspecified: Secondary | ICD-10-CM

## 2018-03-04 DIAGNOSIS — Z7722 Contact with and (suspected) exposure to environmental tobacco smoke (acute) (chronic): Secondary | ICD-10-CM | POA: Insufficient documentation

## 2018-03-04 DIAGNOSIS — R0789 Other chest pain: Secondary | ICD-10-CM | POA: Diagnosis present

## 2018-03-04 LAB — CBC
HEMATOCRIT: 28.2 % — AB (ref 36.0–46.0)
Hemoglobin: 7.9 g/dL — ABNORMAL LOW (ref 12.0–15.0)
MCH: 17.7 pg — ABNORMAL LOW (ref 26.0–34.0)
MCHC: 28 g/dL — ABNORMAL LOW (ref 30.0–36.0)
MCV: 63.2 fL — ABNORMAL LOW (ref 78.0–100.0)
Platelets: 386 10*3/uL (ref 150–400)
RBC: 4.46 MIL/uL (ref 3.87–5.11)
RDW: 20.3 % — AB (ref 11.5–15.5)
WBC: 10.8 10*3/uL — AB (ref 4.0–10.5)

## 2018-03-04 LAB — COMPREHENSIVE METABOLIC PANEL
ALT: 20 U/L (ref 14–54)
ANION GAP: 7 (ref 5–15)
AST: 25 U/L (ref 15–41)
Albumin: 3.1 g/dL — ABNORMAL LOW (ref 3.5–5.0)
Alkaline Phosphatase: 78 U/L (ref 38–126)
BUN: 9 mg/dL (ref 6–20)
CHLORIDE: 105 mmol/L (ref 101–111)
CO2: 24 mmol/L (ref 22–32)
Calcium: 9.1 mg/dL (ref 8.9–10.3)
Creatinine, Ser: 0.82 mg/dL (ref 0.44–1.00)
Glucose, Bld: 114 mg/dL — ABNORMAL HIGH (ref 65–99)
POTASSIUM: 4.2 mmol/L (ref 3.5–5.1)
SODIUM: 136 mmol/L (ref 135–145)
Total Bilirubin: 0.4 mg/dL (ref 0.3–1.2)
Total Protein: 7 g/dL (ref 6.5–8.1)

## 2018-03-04 LAB — BRAIN NATRIURETIC PEPTIDE: B NATRIURETIC PEPTIDE 5: 14.5 pg/mL (ref 0.0–100.0)

## 2018-03-04 LAB — I-STAT TROPONIN, ED: Troponin i, poc: 0 ng/mL (ref 0.00–0.08)

## 2018-03-04 LAB — I-STAT BETA HCG BLOOD, ED (MC, WL, AP ONLY): I-stat hCG, quantitative: <5 m[IU]/mL (ref <5)

## 2018-03-04 LAB — I-STAT CG4 LACTIC ACID, ED: LACTIC ACID, VENOUS: 1.47 mmol/L (ref 0.5–1.9)

## 2018-03-04 MED ORDER — AEROCHAMBER PLUS FLO-VU LARGE MISC
1.0000 | Freq: Once | Status: AC
Start: 2018-03-04 — End: 2018-03-04
  Administered 2018-03-04: 1

## 2018-03-04 MED ORDER — AEROCHAMBER PLUS FLO-VU LARGE MISC
Status: AC
  Filled 2018-03-04: qty 1

## 2018-03-04 MED ORDER — SODIUM CHLORIDE 0.9 % IV SOLN
500.0000 mg | INTRAVENOUS | Status: DC
  Administered 2018-03-04: 500 mg via INTRAVENOUS
  Filled 2018-03-04: qty 500

## 2018-03-04 MED ORDER — IOPAMIDOL (ISOVUE-370) INJECTION 76%
100.0000 mL | Freq: Once | INTRAVENOUS | Status: AC | PRN
  Administered 2018-03-04: 100 mL via INTRAVENOUS

## 2018-03-04 MED ORDER — PREDNISONE 10 MG PO TABS: 40.0000 mg | ORAL_TABLET | Freq: Every day | ORAL | 0 refills | Status: AC

## 2018-03-04 MED ORDER — LACTATED RINGERS IV BOLUS (SEPSIS)
1000.0000 mL | Freq: Once | INTRAVENOUS | Status: DC
  Administered 2018-03-04: 1000 mL via INTRAVENOUS

## 2018-03-04 MED ORDER — IOPAMIDOL (ISOVUE-370) INJECTION 76%
INTRAVENOUS | Status: AC
  Filled 2018-03-04: qty 100

## 2018-03-04 MED ORDER — SODIUM CHLORIDE 0.9 % IV SOLN
2.0000 g | INTRAVENOUS | Status: DC
  Administered 2018-03-04: 2 g via INTRAVENOUS
  Filled 2018-03-04: qty 20

## 2018-03-04 MED ORDER — LACTATED RINGERS IV BOLUS (SEPSIS)
1000.0000 mL | Freq: Once | INTRAVENOUS | Status: AC
  Administered 2018-03-04: 1000 mL via INTRAVENOUS

## 2018-03-04 MED ORDER — ALBUTEROL SULFATE HFA 108 (90 BASE) MCG/ACT IN AERS
1.0000 | INHALATION_SPRAY | Freq: Once | RESPIRATORY_TRACT | Status: AC
  Administered 2018-03-04: 1 via RESPIRATORY_TRACT
  Filled 2018-03-04: qty 6.7

## 2018-03-04 MED ORDER — LACTATED RINGERS IV BOLUS (SEPSIS): 1000.0000 mL | Freq: Once | INTRAVENOUS | Status: DC

## 2018-03-04 MED ORDER — AZITHROMYCIN 250 MG PO TABS: 250.0000 mg | ORAL_TABLET | Freq: Every day | ORAL | 0 refills | Status: DC

## 2018-03-04 NOTE — ED Triage Notes (Signed)
Pt here from home with CP and SOB x 3 days; EMS reports fever of 102 and HR of 130 and room air O2 sat 86% but improved with [place] oxygen; EMS also reports rhonchous breath sounds EMS gave 1000mg  tylenol  324mg  ASA 1 SL NTG tab enroute with no improvement

## 2018-03-04 NOTE — Discharge Instructions (Signed)
Take antibiotics as prescribed.  Take the entire course, even if your symptoms improve. Take prednisone as prescribed. Use the inhaler every 4 hours for the next 2 days.  After this, use only as needed for shortness of breath or chest tightness. Use cough drops or cough syrups as needed for cough. Make sure you are staying well-hydrated with water. It is important that you follow-up with your primary care doctor for further evaluation of your breathing, asthma, and blood pressure. Return to the emergency room if you develop shortness of breath, chest pain, difficulty breathing, or any new or concerning symptoms.

## 2018-03-04 NOTE — ED Notes (Signed)
Transported to radiology per rad tech for films

## 2018-03-04 NOTE — ED Notes (Signed)
Patient currently at CT scan .  

## 2018-03-04 NOTE — ED Notes (Addendum)
Abulated patient to bathroom. Lowest o2 sat 96.

## 2018-03-04 NOTE — ED Provider Notes (Signed)
[facility] DEPART[facility] Provider Note   CSN: [id number] Arrival date & time: 03/04/18  1558     History   Chief Complaint Chief Complaint  Patient presents with  . Chest Pain  . Shortness of Breath  . Fever    HPI Sarah Bean is a 52 y.o. female presenting for evaluation of shortness of breath, cough, and fever.  Patient states that on Saturday, she started to develop cough and congestion.  She states that she is coughing so hard that it is making her chest hurt.  She has no chest pain when she is not coughing.  She is unsure if she has been having fever since Saturday.  She initially denied nausea or vomiting, but later stated she is feeling nauseous.  She denies ear pain, eye pain, sore throat, abdominal pain.  She reports dysuria without hematuria or urinary frequency.  She states she has not had a bowel movement since before she became sick. She has a history of asthma, but does not have an inhaler.  She denies cigarette use, but states that her partner smokes in the house around her.  She has a history of a stroke in 2018, but is not on any blood thinners.  They do not know what caused the stroke.  She has a history of high blood pressure, but does not take medication.  She states she is taking nothing daily.  HPI  Past Medical History:  Diagnosis Date  . Asthma   . Essential hypertension   . Essential hypertension 05/26/2017  . Insomnia   . Manic depression ([place])   . Obesity   . Restless leg syndrome   . Stroke [facility])     Patient Active Problem List   Diagnosis Date Noted  . Essential hypertension 05/26/2017  . Hyperglycemia 05/26/2017  . Microcytic anemia 05/26/2017  . CVA (cerebral vascular accident) ([place]) 05/25/2017    Past Surgical History:  Procedure Laterality Date  . BREAST LUMPECTOMY Left   . CESAREAN SECTION     x3     OB History   None      Home Medications    Prior to Admission medications   Medication Sig Start  Date End Date Taking? Authorizing Provider  Acetaminophen-Caff-Pyrilamine (MIDOL COMPLETE PO) Take 2 tablets by mouth every 8 (eight) hours as needed (for pain).    [provider]  albuterol (PROVENTIL HFA;VENTOLIN HFA) 108 (90 BASE) MCG/ACT inhaler Inhale 1-2 puffs into the lungs every 4 (four) hours as needed for wheezing or shortness of breath. 11/28/13   Liam Graham, PA-C  aspirin 325 MG tablet Take 1 tablet (325 mg total) by mouth daily. 05/28/17   Geradine Girt, DO  atorvastatin (LIPITOR) 40 MG tablet Take 1 tablet (40 mg total) by mouth daily at 6 PM. 05/28/17   Geradine Girt, DO  azithromycin (ZITHROMAX) 250 MG tablet Take 1 tablet (250 mg total) by mouth daily. Take first 2 tablets together, then 1 every day until finished. 03/04/18   Gomer France, PA-C  lisinopril-hydrochlorothiazide (PRINZIDE,ZESTORETIC) 20-25 MG tablet [name]    [provider]  predniSONE (DELTASONE) 10 MG tablet Take 4 tablets (40 mg total) by mouth daily for 5 days. 03/04/18 03/09/18  Amay Mijangos, PA-C  QUEtiapine (SEROQUEL) 300 MG tablet Take 300 mg by mouth at bedtime.     [provider]  rOPINIRole (REQUIP) 0.25 MG tablet Take 0.25 mg by mouth at bedtime.  [provider]  traZODone (DESYREL) 150 MG tablet Take 150 mg by mouth at bedtime.     [provider]    Family History Family History  Problem Relation Age of Onset  . CAD Mother 48  . Other Father 31       struck by lightning  . Stroke Neg Hx     Social History Social History   Tobacco Use  . Smoking status: Never Smoker  . Smokeless tobacco: Never Used  Substance Use Topics  . Alcohol use: No  . Drug use: No     Allergies   Patient has no known allergies.   Review of Systems Review of Systems  HENT: Positive for congestion.   Respiratory: Positive for cough and shortness of breath.   Cardiovascular: Positive for chest pain (when coughing).    Gastrointestinal: Positive for constipation.  Genitourinary: Positive for dysuria.  All other systems reviewed and are negative.    Physical Exam Updated Vital Signs BP (!) 164/74 (BP Location: Right Arm)   Pulse 84   Temp 99.2 F (37.3 C) (Oral)   Resp 18   Ht 5\' 4"  (1.626 m)   Wt 117.9 kg (260 lb)   SpO2 99%   BMI 44.63 kg/m   Physical Exam  Constitutional: She is oriented to person, place, and time. She appears well-developed and well-nourished.  Obese female, not in respiratory distress  HENT:  Head: Normocephalic and atraumatic.  Eyes: Pupils are equal, round, and reactive to light. Conjunctivae and EOM are normal.  Neck: Normal range of motion. Neck supple.  Cardiovascular: Regular rhythm and intact distal pulses.  Tachycardic around 115  Pulmonary/Chest: Effort normal and breath sounds normal. No respiratory distress. She has no wheezes.  Speaking full sentences.  Decreased lung sounds throughout, patient with large body habitus likely contributing to poor exam.  No respiratory distress.  Sats stable on O2.  Abdominal: Soft. She exhibits no distension and no mass. There is no tenderness. There is no guarding.  No tenderness palpation of the abdomen.  Soft without rigidity, guarding, distention.  Musculoskeletal: Normal range of motion.  Strength intact x4.  Sensation intact x4.  Color and warmth equal bilaterally.  Radial and pedal pulses intact bilaterally.  Neurological: She is alert and oriented to person, place, and time. No sensory deficit.  Skin: Skin is warm and dry.  Psychiatric: She has a normal mood and affect.  Nursing note and vitals reviewed.    ED Treatments / Results  Labs (all labs ordered are listed, but only abnormal results are displayed) Labs Reviewed  CBC - Abnormal; Notable for the following components:      Result Value   WBC 10.8 (*)    Hemoglobin 7.9 (*)    HCT 28.2 (*)    MCV 63.2 (*)    MCH 17.7 (*)    MCHC 28.0 (*)    RDW 20.3  (*)    All other components within normal limits  COMPREHENSIVE METABOLIC PANEL - Abnormal; Notable for the following components:   Glucose, Bld 114 (*)    Albumin 3.1 (*)    All other components within normal limits  CULTURE, BLOOD (ROUTINE X 2)  CULTURE, BLOOD (ROUTINE X 2)  BRAIN NATRIURETIC PEPTIDE  URINALYSIS, ROUTINE W REFLEX MICROSCOPIC  I-STAT TROPONIN, ED  I-STAT BETA HCG BLOOD, ED (MC, [facility], AP ONLY)  I-STAT CG4 LACTIC ACID, ED    EKG None  Radiology Dg Chest 2 View  Result Date: 03/04/2018  CLINICAL DATA:  Productive cough EXAM: CHEST - 2 VIEW COMPARISON:  05/26/2017 FINDINGS: No focal airspace disease or pleural effusion. Mild cardiomegaly. No pneumothorax. Degenerative changes of the spine. IMPRESSION: No active cardiopulmonary disease.  Mild cardiomegaly. Electronically Signed   By: Donavan Foil M.D.   On: 03/04/2018 18:44   Ct Angio Chest Pe W/cm &/or Wo Cm  Result Date: 03/04/2018 CLINICAL DATA:  High pretest probability for pulmonary embolism. Chest pain and shortness of breath for 3 days with fever and tachycardia. EXAM: CT ANGIOGRAPHY CHEST WITH CONTRAST TECHNIQUE: Multidetector CT imaging of the chest was performed using the standard protocol during bolus administration of intravenous contrast. Multiplanar CT image reconstructions and MIPs were obtained to evaluate the vascular anatomy. CONTRAST:  100 cc Isovue 370 intravenous COMPARISON:  None available FINDINGS: Cardiovascular: Satisfactory opacification of the pulmonary arteries to the segmental level. No evidence of pulmonary embolism as permitted by diminished detail from body habitus and transient interruption of contrast. Normal heart size. No pericardial effusion. Mediastinum/Nodes: Bilateral mild hilar lymph node enlargement, presumably reactive in this setting. Lungs/Pleura: Generalized airway thickening with occasional mucoid impaction. No consolidation, edema, effusion, or pneumothorax. Upper Abdomen: No acute  finding. Multiple peripherally calcified gallstones. Musculoskeletal: Spondylosis. Review of the MIP images confirms the above findings. IMPRESSION: 1. Prominent bronchitis.  No collapse or consolidation. 2. Negative for pulmonary embolism. 3. Cholelithiasis. Electronically Signed   By: Monte Fantasia M.D.   On: 03/04/2018 20:40    Procedures Procedures (including critical care time)  Medications Ordered in ED Medications  cefTRIAXone (ROCEPHIN) 2 g in sodium chloride 0.9 % 100 mL IVPB (0 g Intravenous Stopped 03/04/18 1741)  azithromycin (ZITHROMAX) 500 mg in sodium chloride 0.9 % 250 mL IVPB (0 mg Intravenous Stopped 03/04/18 1921)  AEROCHAMBER PLUS FLO-VU LARGE MISC (has no administration in time range)  lactated ringers bolus 1,000 mL (0 mLs Intravenous Stopped 03/04/18 1832)    And  lactated ringers bolus 1,000 mL (0 mLs Intravenous Stopped 03/04/18 1914)  iopamidol (ISOVUE-370) 76 % injection 100 mL (100 mLs Intravenous Contrast Given 03/04/18 1945)  albuterol (PROVENTIL HFA;VENTOLIN HFA) 108 (90 Base) MCG/ACT inhaler 1 puff (1 puff Inhalation Given 03/04/18 2123)  AEROCHAMBER PLUS FLO-VU LARGE MISC 1 each (1 each Other Given 03/04/18 2130)     Initial Impression / Assessment and Plan / ED Course  I have reviewed the triage vital signs and the nursing notes.  Pertinent labs & imaging results that were available during my care of the patient were reviewed by me and considered in my medical decision making (see chart for details).     Patient presented for evaluation of cough, congestion, chest pain.  Physical exam shows patient who is tachycardic and febrile.  Per EMS, she was satting at 86% on their arrival.  On exam, she is not in respiratory distress, although remains tachycardic and febrile.  Sats stable on oxygen.  No leg pain or swelling.  Patient meets sirs criteria at this time, will call code sepsis and reevaluate.  Also consider PE, as patient has had CVA and is not on blood  thinners, however at this time I believe this is less likely. Will start fluids and abx. Case discussed with attending, Dr. Tomi Bamberger evaluated the pt.   Sepsis reevaluation done.  Initial lactic negative.  White count mildly elevated at 10.8.  Heart rate and temperature improving.  Patient remained stable. Doubt sepsis.  Heart rate remains stable.  Patient sats stable off oxygen.  CTA negative  for PE.  Ambulated patient off oxygen, sats remained above 90%.  Likely bronchitis.  Will treat with steroids, albuterol, and antibiotics.  Follow-up with primary care.  Strict return precautions given.  At this time, patient appears safe for discharge.  Patient states she understands and agrees plan.  Final Clinical Impressions(s) / ED Diagnoses   Final diagnoses:  Bronchitis  Shortness of breath  Cough    ED Discharge Orders        Ordered    azithromycin (ZITHROMAX) 250 MG tablet  Daily     03/04/18 2117    predniSONE (DELTASONE) 10 MG tablet  Daily     03/04/18 2117       Franchot Heidelberg, PA-C 03/04/18 2203    Dorie Rank, MD 03/08/18 [phone]

## 2018-03-09 LAB — CULTURE, BLOOD (ROUTINE X 2)
CULTURE: NO GROWTH
CULTURE: NO GROWTH
Special Requests: ADEQUATE
Special Requests: ADEQUATE

## 2018-04-07 ENCOUNTER — Encounter: Payer: Self-pay | Admitting: Adult Health

## 2018-04-07 ENCOUNTER — Ambulatory Visit: Payer: Medicaid Other | Admitting: Adult Health

## 2018-04-07 VITALS — BP 136/87 | HR 94 | Ht 63.0 in | Wt 247.2 lb

## 2018-04-07 DIAGNOSIS — E785 Hyperlipidemia, unspecified: Secondary | ICD-10-CM

## 2018-04-07 DIAGNOSIS — I10 Essential (primary) hypertension: Secondary | ICD-10-CM | POA: Diagnosis not present

## 2018-04-07 DIAGNOSIS — Z8673 Personal history of transient ischemic attack (TIA), and cerebral infarction without residual deficits: Secondary | ICD-10-CM | POA: Diagnosis not present

## 2018-04-07 NOTE — Progress Notes (Signed)
[facility] [address] [place]. [place] [address] [phone]       OFFICE FOLLOW-UP NOTE  Sarah. Sarah Bean Date of Birth:  18-May-1966 Medical Record Number:  [id number]   HPI: Sarah Bean is a 58 year African-American lady seen today for follow-up for the first time after recent hospital admission for strokelike symptoms. She is accompanied by her son. History is obtained from the patient, review of electronic medical records and have personally reviewed imaging films.Sarah Davidson Brownis an 52 y.o.femalewith a history of hypertension, obesity and bipolar affective disorder presenting with numbness involving right side of her face and right upper extremity of sudden onset. Initial symptoms occurred yesterday and lasted for about 1 hour. Current symptoms started at 6:00 PM on 05/25/2017 and have persisted. She had slurred speech on 05/25/2017, which has resolved. Numbness has persisted.  The patient also reports vertigo that began about one week ago.  CT scan of her head showed no acute intracranial abnormality. She has not been on antiplatelet therapy daily. NIH stroke score was 1. LSN:1800 on 05/25/2017.Patient was not administered IV t-PA secondary to low NIHSS/minimal deficits on arrival. She was admitted   for further evaluation and treatment. CT scan of the head showed a small left occipital lobe nonhemorrhagic recent appearing infarct but patient was unable to tolerate MRI scan. Carotid Doppler showed no significant extracranial stenosis. Transthoracic echo showed normal ejection fraction without cardiac source of embolism. LDL cholesterol was elevated at 75 mg percent. Hemoglobin A1c was 5.5. Telemetry monitoring in the hospital did not show any cardiac arrhythmias.   07/15/17 visit Dr. Leonie Man: Patient had right-sided numbness which appears to have improved with a few days after discharge. She did not have any recurrent symptoms but does complain of some seen spots in front of her  eyes which are moving around more on the right than the left. This has started only 2 weeks ago. She denies any accompanying headache, double vision, she is denies any decreased vision acuity or visual fields and has not been bumping into objects. She has not seen an eye doctor but plans to see one soon. She is tolerating aspirin without bleeding or bruising. She is also on Lipitor 40 mg is tolerating well. She states her blood pressure is well controlled and today it is 116/78. She takes Requip for restless legs which also seems to be well controlled. She plans on eating healthy diet but has not yet lost any weight. I explained to the patient that she probably had a left posterior circulation PCA branch infarct and Ct scan was suggestive  but not conclusive and she was unable to tolerate MRI in the hospital. Repeating an open MRI images confirm the diagnosis but is unlikely to change the treatment plan hence we would not get an MRI as it might make her uncomfortable  04/07/18 UPDATE: Patient is being seen for follow-up.  She continues to complain of vision issues which she states have been worsening as her vision is blurry and " fungus".  She did undergo MRI on 09/09/2017 which showed chronic left occipital lobe infarcts but no other acute findings.  Patient has not schedule appointment with eye doctor since previous appointment as advised.  She does continue to take aspirin without side effects of bleeding or bruising.  Continues to take Lipitor without side effects myalgias.  Blood pressure today satisfactory 136/87.  Patient is followed by [facility] for depression/anxiety.  She currently is taking Seroquel, trazodone and Prozac but  does need a refill on all of these and is planning on going to the walk-in clinic.  Denies new or worsening stroke/TIA symptoms at this time.   ROS:   14 system review of systems is positive for blurred vision, cough, wheezing, shortness of breath, restless leg, insomnia, joint  pain, back pain, aching muscles, muscle cramps, rash, itching, dizziness, headache, agitation, depression, nervous/anxious, and all other systems negative  PMH:  Past Medical History:  Diagnosis Date  . Asthma   . Essential hypertension   . Essential hypertension 05/26/2017  . Insomnia   . Manic depression ([place])   . Obesity   . Restless leg syndrome   . Stroke [facility])     Social History:  Social History   Socioeconomic History  . Marital status: Single    Spouse name: Not on file  . Number of children: Not on file  . Years of education: Not on file  . Highest education level: Not on file  Occupational History  . Occupation: disabled  Social Needs  . Financial resource strain: Not on file  . Food insecurity:    Worry: Not on file    Inability: Not on file  . Transportation needs:    Medical: Not on file    Non-medical: Not on file  Tobacco Use  . Smoking status: Never Smoker  . Smokeless tobacco: Never Used  Substance and Sexual Activity  . Alcohol use: No  . Drug use: No  . Sexual activity: Not on file  Lifestyle  . Physical activity:    Days per week: Not on file    Minutes per session: Not on file  . Stress: Not on file  Relationships  . Social connections:    Talks on phone: Not on file    Gets together: Not on file    Attends religious service: Not on file    Active member of club or organization: Not on file    Attends meetings of clubs or organizations: Not on file    Relationship status: Not on file  . Intimate partner violence:    Fear of current or ex partner: Not on file    Emotionally abused: Not on file    Physically abused: Not on file    Forced sexual activity: Not on file  Other Topics Concern  . Not on file  Social History Narrative   Patient lives at home with her son, who has autism. He vocalizes and acts out.    Medications:   Current Outpatient Medications on File Prior to Visit  Medication Sig Dispense Refill  .  Acetaminophen-Caff-Pyrilamine (MIDOL COMPLETE PO) Take 2 tablets by mouth every 8 (eight) hours as needed (for pain).    Marland Kitchen albuterol (PROVENTIL HFA;VENTOLIN HFA) 108 (90 BASE) MCG/ACT inhaler Inhale 1-2 puffs into the lungs every 4 (four) hours as needed for wheezing or shortness of breath. 1 Inhaler 0  . aspirin 325 MG tablet Take 1 tablet (325 mg total) by mouth daily. 30 tablet 0  . atorvastatin (LIPITOR) 40 MG tablet Take 1 tablet (40 mg total) by mouth daily at 6 PM. 30 tablet 0  . lisinopril-hydrochlorothiazide (PRINZIDE,ZESTORETIC) 20-25 MG tablet [name]    . QUEtiapine (SEROQUEL) 300 MG tablet Take 300 mg by mouth at bedtime.     Marland Kitchen rOPINIRole (REQUIP) 0.25 MG tablet Take 0.25 mg by mouth at bedtime.    . traZODone (DESYREL) 150 MG tablet Take 150 mg by mouth at bedtime.  No current facility-administered medications on file prior to visit.     Allergies:  No Known Allergies  Physical Exam General: Obese middle-aged African-American lady seated, in no evident distress Head: head normocephalic and atraumatic.  Neck: supple with no carotid or supraclavicular bruits Cardiovascular: regular rate and rhythm, no murmurs Musculoskeletal: no deformity Skin:  no rash/petichiae Vascular:  Normal pulses all extremities Vitals:   04/07/18 0908  BP: 136/87  Pulse: 94   Neurologic Exam Mental Status: Awake and fully alert. Oriented to place and time. Recent and remote memory intact. Attention span, concentration and fund of knowledge appropriate. Mood and affect appropriate.  Cranial Nerves: Fundoscopic exam reveals sharp disc margins. Pupils equal, briskly reactive to light. Extraocular movements full without nystagmus. Visual fields full to confrontation. Hearing intact. Facial sensation intact. Face, tongue, palate moves normally and symmetrically.  Motor: Normal bulk and tone. Normal strength in all tested extremity muscles. Sensory.: intact to touch ,pinprick  .position and vibratory sensation.  Coordination: Rapid alternating movements normal in all extremities. Finger-to-nose and heel-to-shin performed accurately bilaterally. Gait and Station: Arises from chair without difficulty. Stance is normal. Gait demonstrates normal stride length and balance . Able to heel, toe and tandem walk without difficulty.  Reflexes: 1+ and symmetric. Toes downgoing.    IMAGING MRI BRAIN W WO CONTRAST 09/09/17 IMPRESSION:  This MRI of the brain with and without contrast shows the following: 1.    Remote left occipital lobe chronic infarction 2.    Mild chronic microvascular ischemic changes. 3.    There are no acute findings and there is a normal enhancement pattern.   ASSESSMENT: 52 year old lady with episode of sudden onset right-sided paresthesias in August 2018 for CVA from left posterior cerebral artery branch infarct etiology indeterminate. Patient refused MRI at that time. New complaints of vision and floaters in the right eye of unclear etiology.  Patient returns today for follow-up visit.    PLAN: -Continue aspirin 325 mg daily  and Lipitor for secondary stroke prevention -Advised patient to schedule appointment with ophthalmologist to assess patient complaints -Highly advised to follow-up with [facility] for depression/anxiety -F/u with PCP regarding your HLD and HTN management and continued prescribing -continue to monitor BP at home -Maintain strict control of hypertension with blood pressure goal below 130/90, diabetes with hemoglobin A1c goal below 6.5% and cholesterol with LDL cholesterol (bad cholesterol) goal below 70 mg/dL. I also advised the patient to eat a healthy diet with plenty of whole grains, cereals, fruits and vegetables, exercise regularly and maintain ideal body weight.  Follow up as needed or call earlier if needed   Greater than 50% of time during this 25 minute visit was spent on counseling,explanation of diagnosis of left  posterior cerebral artery branch infarct, reviewing risk factor management of HLD and HTN, planning of further management, discussion with patient and family and coordination of care  Venancio Poisson, [facility]  [facility] [address] [place], [place] [address]  Phone [phone] Fax [phone]

## 2018-04-07 NOTE — Patient Instructions (Signed)
Continue aspirin 325 mg daily  and lipitor  for secondary stroke prevention  Continue to follow up with PCP regarding cholesteorl and blood pressure management   Follow up with [facility] regarding depression and anxiety  Schedule appointment with eye doctor for continued vision issues  Continue to monitor blood pressure at home  Maintain strict control of hypertension with blood pressure goal below 130/90, diabetes with hemoglobin A1c goal below 6.5% and cholesterol with LDL cholesterol (bad cholesterol) goal below 70 mg/dL. I also advised the patient to eat a healthy diet with plenty of whole grains, cereals, fruits and vegetables, exercise regularly and maintain ideal body weight.  Followup in the future with me as needed or call earlier if needed        Thank you for coming to see [place] at [facility]. I hope we have been able to provide you high quality care today.  You may receive a patient satisfaction survey over the next few weeks. We would appreciate your feedback and comments so that we may continue to improve ourselves and the health of our patients.

## 2018-04-08 NOTE — Progress Notes (Signed)
I agree with the above plan 

## 2019-01-11 ENCOUNTER — Other Ambulatory Visit: Payer: Self-pay

## 2019-01-11 ENCOUNTER — Encounter (HOSPITAL_COMMUNITY): Payer: Self-pay | Admitting: Emergency Medicine

## 2019-01-11 ENCOUNTER — Ambulatory Visit (HOSPITAL_COMMUNITY)
Admission: EM | Admit: 2019-01-11 | Discharge: 2019-01-11 | Disposition: A | Discharge status: Home / Self Care | Payer: Medicaid Other | Attending: Family Medicine | Admitting: Family Medicine

## 2019-01-11 ENCOUNTER — Emergency Department (HOSPITAL_COMMUNITY)
Admission: EM | Admit: 2019-01-11 | Discharge: 2019-01-11 | Disposition: A | Discharge status: Home / Self Care | Payer: Medicaid Other | Attending: Emergency Medicine | Admitting: Emergency Medicine

## 2019-01-11 ENCOUNTER — Encounter (HOSPITAL_COMMUNITY): Payer: Self-pay

## 2019-01-11 DIAGNOSIS — Z8673 Personal history of transient ischemic attack (TIA), and cerebral infarction without residual deficits: Secondary | ICD-10-CM | POA: Diagnosis not present

## 2019-01-11 DIAGNOSIS — D649 Anemia, unspecified: Secondary | ICD-10-CM

## 2019-01-11 DIAGNOSIS — I10 Essential (primary) hypertension: Secondary | ICD-10-CM | POA: Diagnosis not present

## 2019-01-11 DIAGNOSIS — Z7982 Long term (current) use of aspirin: Secondary | ICD-10-CM | POA: Diagnosis not present

## 2019-01-11 DIAGNOSIS — F329 Major depressive disorder, single episode, unspecified: Secondary | ICD-10-CM | POA: Insufficient documentation

## 2019-01-11 DIAGNOSIS — I999 Unspecified disorder of circulatory system: Secondary | ICD-10-CM | POA: Diagnosis not present

## 2019-01-11 DIAGNOSIS — J45909 Unspecified asthma, uncomplicated: Secondary | ICD-10-CM | POA: Insufficient documentation

## 2019-01-11 DIAGNOSIS — R231 Pallor: Secondary | ICD-10-CM | POA: Diagnosis not present

## 2019-01-11 DIAGNOSIS — L819 Disorder of pigmentation, unspecified: Secondary | ICD-10-CM | POA: Diagnosis present

## 2019-01-11 LAB — CBC WITH DIFFERENTIAL/PLATELET
ABS IMMATURE GRANULOCYTES: 0.03 10*3/uL (ref 0.00–0.07)
Abs Immature Granulocytes: 0.05 10*3/uL (ref 0.00–0.07)
Basophils Absolute: 0 10*3/uL (ref 0.0–0.1)
Basophils Absolute: 0 10*3/uL (ref 0.0–0.1)
Basophils Relative: 0 %
Basophils Relative: 0 %
Eosinophils Absolute: 0.1 10*3/uL (ref 0.0–0.5)
Eosinophils Absolute: 0.1 10*3/uL (ref 0.0–0.5)
Eosinophils Relative: 2 %
Eosinophils Relative: 1 %
HCT: 24.5 % — ABNORMAL LOW (ref 36.0–46.0)
HCT: 27 % — ABNORMAL LOW (ref 36.0–46.0)
Hemoglobin: 6.7 g/dL — CL (ref 12.0–15.0)
Hemoglobin: 7.2 g/dL — ABNORMAL LOW (ref 12.0–15.0)
IMMATURE GRANULOCYTES: 0 %
Immature Granulocytes: 1 %
LYMPHS ABS: 2.1 10*3/uL (ref 0.7–4.0)
LYMPHS PCT: 22 %
Lymphocytes Relative: 18 %
Lymphs Abs: 2 10*3/uL (ref 0.7–4.0)
MCH: 16.2 pg — ABNORMAL LOW (ref 26.0–34.0)
MCH: 16 pg — ABNORMAL LOW (ref 26.0–34.0)
MCHC: 27.3 g/dL — ABNORMAL LOW (ref 30.0–36.0)
MCHC: 26.7 g/dL — ABNORMAL LOW (ref 30.0–36.0)
MCV: 59.2 fL — ABNORMAL LOW (ref 80.0–100.0)
MCV: 59.9 fL — ABNORMAL LOW (ref 80.0–100.0)
Monocytes Absolute: 0.5 10*3/uL (ref 0.1–1.0)
Monocytes Absolute: 0.4 10*3/uL (ref 0.1–1.0)
Monocytes Relative: 5 %
Monocytes Relative: 4 %
NEUTROS ABS: 6.9 10*3/uL (ref 1.7–7.7)
NEUTROS PCT: 71 %
NRBC: 0 % (ref 0.0–0.2)
Neutro Abs: 8.5 10*3/uL — ABNORMAL HIGH (ref 1.7–7.7)
Neutrophils Relative %: 76 %
PLATELETS: 396 10*3/uL (ref 150–400)
Platelets: 418 10*3/uL — ABNORMAL HIGH (ref 150–400)
RBC: 4.14 MIL/uL (ref 3.87–5.11)
RBC: 4.51 MIL/uL (ref 3.87–5.11)
RDW: 20.9 % — AB (ref 11.5–15.5)
RDW: 21.2 % — ABNORMAL HIGH (ref 11.5–15.5)
WBC: 9.6 10*3/uL (ref 4.0–10.5)
WBC: 11.1 10*3/uL — ABNORMAL HIGH (ref 4.0–10.5)
nRBC: 0 % (ref 0.0–0.2)

## 2019-01-11 LAB — BASIC METABOLIC PANEL
Anion gap: 11 (ref 5–15)
BUN: 9 mg/dL (ref 6–20)
CO2: 21 mmol/L — ABNORMAL LOW (ref 22–32)
Calcium: 9.6 mg/dL (ref 8.9–10.3)
Chloride: 105 mmol/L (ref 98–111)
Creatinine, Ser: 0.73 mg/dL (ref 0.44–1.00)
GFR calc Af Amer: >60 mL/min (ref >60)
GFR calc non Af Amer: >60 mL/min (ref >60)
Glucose, Bld: 88 mg/dL (ref 70–99)
Potassium: 4.5 mmol/L (ref 3.5–5.1)
Sodium: 137 mmol/L (ref 135–145)

## 2019-01-11 LAB — LACTIC ACID, PLASMA: Lactic Acid, Venous: 1.2 mmol/L (ref 0.5–1.9)

## 2019-01-11 LAB — TYPE AND SCREEN
ABO/RH(D): B POS
Antibody Screen: NEGATIVE

## 2019-01-11 LAB — ABO/RH: ABO/RH(D): B POS

## 2019-01-11 LAB — SEDIMENTATION RATE: Sed Rate: 34 mm/hr — ABNORMAL HIGH (ref 0–22)

## 2019-01-11 MED ORDER — FERROUS SULFATE 325 (65 FE) MG PO TABS: 325.0000 mg | ORAL_TABLET | Freq: Every day | ORAL | 0 refills | Status: DC

## 2019-01-11 NOTE — ED Triage Notes (Signed)
On Saturday both ankles painful.  Both ankles continue to be painful.  Patient reports bruising to both legs, skin is splotchy, but not bruised

## 2019-01-11 NOTE — Discharge Instructions (Signed)
Need to go to ER for additional evaluation

## 2019-01-11 NOTE — ED Notes (Signed)
ABI attempted, unable to obtain results. MD aware, is recommending pt's care be continued in ED.

## 2019-01-11 NOTE — ED Notes (Addendum)
Pt ambulated to bathroom without assistance. UA collected and at bedside in Pt room.

## 2019-01-11 NOTE — ED Provider Notes (Signed)
[place] EMERGENCY DEPARTMENT Provider Note   CSN: [id number] Arrival date & time: 01/11/19  1117    History   Chief Complaint Chief Complaint  Patient presents with  . Leg Pain    HPI Sarah Bean is a 53 y.o. female.     22-year-old female with prior medical history as detailed below presents for evaluation of discoloration of her legs.  Patient reports that on Saturday she noticed a lacy pattern appear on her anterior shins.  She denies any current pain.  She does report some mild crampy pain on Saturday.  She has had no pain since.  She is able to ambulate without difficulty.  She denies associated fever.  She denies loss of sensation or weakness to her legs.  She was seen earlier today and sent to the ED for evaluation.  Patient does have a history of anemia.  She is not currently taking iron supplementation.  She reports that she is not currently taking any medications.  The history is provided by the patient and medical records.  Illness  Location:  Skin changes Severity:  Mild Onset quality:  Gradual Duration:  2 days Timing:  Rare Progression:  Unchanged Chronicity:  New Associated symptoms: no chest pain, no fever and no shortness of breath     Past Medical History:  Diagnosis Date  . Asthma   . Essential hypertension   . Essential hypertension 05/26/2017  . Insomnia   . Manic depression ([place])   . Obesity   . Restless leg syndrome   . Stroke [facility])     Patient Active Problem List   Diagnosis Date Noted  . Essential hypertension 05/26/2017  . Hyperglycemia 05/26/2017  . Microcytic anemia 05/26/2017  . CVA (cerebral vascular accident) ([place]) 05/25/2017    Past Surgical History:  Procedure Laterality Date  . BREAST LUMPECTOMY Left   . CESAREAN SECTION     x3     OB History   No obstetric history on file.      Home Medications    Prior to Admission medications   Medication Sig Start Date End Date Taking? Authorizing  Provider  Acetaminophen-Caff-Pyrilamine (MIDOL COMPLETE PO) Take 2 tablets by mouth every 8 (eight) hours as needed (for pain).    [provider]  albuterol (PROVENTIL HFA;VENTOLIN HFA) 108 (90 BASE) MCG/ACT inhaler Inhale 1-2 puffs into the lungs every 4 (four) hours as needed for wheezing or shortness of breath. 11/28/13   Liam Graham, PA-C  aspirin 325 MG tablet Take 1 tablet (325 mg total) by mouth daily. 05/28/17   Geradine Girt, DO  ferrous sulfate 325 (65 FE) MG tablet Take 1 tablet (325 mg total) by mouth daily. 01/11/19   Valarie Merino, MD    Family History Family History  Problem Relation Age of Onset  . CAD Mother 95  . Other Father 57       struck by lightning  . Stroke Neg Hx     Social History Social History   Tobacco Use  . Smoking status: Never Smoker  . Smokeless tobacco: Never Used  Substance Use Topics  . Alcohol use: No  . Drug use: No     Allergies   Patient has no known allergies.   Review of Systems Review of Systems  Constitutional: Negative for fever.  Respiratory: Negative for shortness of breath.   Cardiovascular: Negative for chest pain.  All other systems reviewed and are negative.    Physical  Exam Updated Vital Signs BP (!) 149/95   Pulse 76   Temp 98 F (36.7 C) (Oral)   Resp 16   LMP 12/21/2018   SpO2 100%   Physical Exam Vitals signs and nursing note reviewed.  Constitutional:      General: She is not in acute distress.    Appearance: She is well-developed.  HENT:     Head: Normocephalic and atraumatic.  Eyes:     Conjunctiva/sclera: Conjunctivae normal.     Pupils: Pupils are equal, round, and reactive to light.  Neck:     Musculoskeletal: Normal range of motion and neck supple.  Cardiovascular:     Rate and Rhythm: Normal rate and regular rhythm.     Heart sounds: Normal heart sounds.  Pulmonary:     Effort: Pulmonary effort is normal. No respiratory distress.     Breath sounds: Normal breath sounds.   Abdominal:     General: There is no distension.     Palpations: Abdomen is soft.     Tenderness: There is no abdominal tenderness.  Musculoskeletal: Normal range of motion.        General: No deformity.     Comments: Lower extremities are neurovascular intact with 2+ dorsalis pedis pulses.  Sensation is intact grossly.  Normal gait.  No lower extremity pain noted.  Skin:    General: Skin is warm and dry.     Comments: Mild livido reticularis to both anterior legs noted    Neurological:     Mental Status: She is alert and oriented to person, place, and time.      ED Treatments / Results  Labs (all labs ordered are listed, but only abnormal results are displayed) Labs Reviewed  BASIC METABOLIC PANEL - Abnormal; Notable for the following components:      Result Value   CO2 21 (*)    All other components within normal limits  CBC WITH DIFFERENTIAL/PLATELET - Abnormal; Notable for the following components:   WBC 11.1 (*)    Hemoglobin 7.2 (*)    HCT 27.0 (*)    MCV 59.9 (*)    MCH 16.0 (*)    MCHC 26.7 (*)    RDW 21.2 (*)    Platelets 418 (*)    Neutro Abs 8.5 (*)    All other components within normal limits  LACTIC ACID, PLASMA  TYPE AND SCREEN  ABO/RH    EKG None  Radiology No results found.  Procedures Procedures (including critical care time)  Medications Ordered in ED Medications - No data to display   Initial Impression / Assessment and Plan / ED Course  I have reviewed the triage vital signs and the nursing notes.  Pertinent labs & imaging results that were available during my care of the patient were reviewed by me and considered in my medical decision making (see chart for details).        MDM  Screen complete  Patient is presenting for evaluation of reported skin changes to her legs.  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   No evidence on exam of significant vascular compromise noted.  Labs  are remarkable for anemia.  Patient is aware of this.  She is without current symptoms from her anemia.    She is advised to closely follow-up with her regular care providers both for further work-up of her anemia and her livedo reticularis.  Importance of close follow-up was stressed.  Strict return precautions  given and understood.  Patient ambulated from the ED without difficulty.  Final Clinical Impressions(s) / ED Diagnoses   Final diagnoses:  Livedo reticularis  Anemia, unspecified type    ED Discharge Orders         Ordered    ferrous sulfate 325 (65 FE) MG tablet  Daily     01/11/19 1357           Valarie Merino, MD 01/11/19 1409

## 2019-01-11 NOTE — Discharge Instructions (Signed)
Please return for any problem.  Follow-up with your regular care provider as instructed. °

## 2019-01-11 NOTE — ED Notes (Signed)
Patient verbalizes understanding of discharge instructions. Opportunity for questioning and answers were provided. Armband removed by staff, pt discharged from ED. Pt ambulatory to lobby.  

## 2019-01-11 NOTE — ED Triage Notes (Signed)
Pt states she had leg pain on Saturday. She has what appears to be mottling on the legs bilaterally. Pt ambulatory. No distress noted.

## 2019-01-11 NOTE — ED Provider Notes (Signed)
[place]    CSN: [id number] Arrival date & time: 01/11/19  6761     History   Chief Complaint Chief Complaint  Patient presents with  . Ankle Pain    HPI Sarah Bean is a 53 y.o. female.   HPI  Patient is here with a chief complaint of ankle pain.  Happened on Saturday.  She states that her whole lower legs from the calf down are painful.  More painful with walking.  She has been trying to elevate her legs.  This makes him feel little bit better.  She has noticed some swelling in her ankles.  She also notes that her "skin is purple". She has had no recent infection, or respiratory symptoms.  No chest pain or shortness of breath. Appetite has been poor.  She states she has been depressed.  She has known mental illness but is not taking any of her medications. She has a history of a stroke.  She is not on her blood pressure medication or her Lipitor. She states she did not have any fall or injury.  No change in her activity.  No change in her diet.   Past Medical History:  Diagnosis Date  . Asthma   . Essential hypertension   . Essential hypertension 05/26/2017  . Insomnia   . Manic depression ([place])   . Obesity   . Restless leg syndrome   . Stroke [facility])     Patient Active Problem List   Diagnosis Date Noted  . Essential hypertension 05/26/2017  . Hyperglycemia 05/26/2017  . Microcytic anemia 05/26/2017  . CVA (cerebral vascular accident) ([place]) 05/25/2017    Past Surgical History:  Procedure Laterality Date  . BREAST LUMPECTOMY Left   . CESAREAN SECTION     x3    OB History   No obstetric history on file.      Home Medications    Prior to Admission medications   Medication Sig Start Date End Date Taking? Authorizing Provider  Acetaminophen-Caff-Pyrilamine (MIDOL COMPLETE PO) Take 2 tablets by mouth every 8 (eight) hours as needed (for pain).    [provider]  albuterol (PROVENTIL HFA;VENTOLIN HFA) 108 (90 BASE) MCG/ACT inhaler  Inhale 1-2 puffs into the lungs every 4 (four) hours as needed for wheezing or shortness of breath. 11/28/13   Liam Graham, PA-C  aspirin 325 MG tablet Take 1 tablet (325 mg total) by mouth daily. 05/28/17   Geradine Girt, DO    Family History Family History  Problem Relation Age of Onset  . CAD Mother 24  . Other Father 82       struck by lightning  . Stroke Neg Hx     Social History Social History   Tobacco Use  . Smoking status: Never Smoker  . Smokeless tobacco: Never Used  Substance Use Topics  . Alcohol use: No  . Drug use: No     Allergies   Patient has no known allergies.   Review of Systems Review of Systems  Constitutional: Negative for chills and fever.  HENT: Negative for ear pain and sore throat.   Eyes: Negative for pain and visual disturbance.  Respiratory: Negative for cough and shortness of breath.   Cardiovascular: Negative for chest pain and palpitations.  Gastrointestinal: Negative for abdominal pain and vomiting.  Genitourinary: Negative for dysuria and hematuria.  Musculoskeletal: Positive for arthralgias. Negative for back pain.       Legs and ankles hurt  Skin: Negative  for color change and rash.  Neurological: Negative for seizures and syncope.  All other systems reviewed and are negative.    Physical Exam Triage Vital Signs ED Triage Vitals  Enc Vitals Group     BP 01/11/19 1010 138/82     Pulse Rate 01/11/19 1010 83     Resp 01/11/19 1010 18     Temp 01/11/19 1010 98.3 F (36.8 C)     Temp Source 01/11/19 1010 Oral     SpO2 01/11/19 1010 100 %     Weight --      Height --      Head Circumference --      Peak Flow --      Pain Score 01/11/19 1006 10     Pain Loc --      Pain Edu? --      Excl. in [place]? --    No data found.  Updated Vital Signs BP 138/82 (BP Location: Left Arm) Comment: large cuff/ No blood pressure medicines in 1 year per patient  Pulse 83   Temp 98.3 F (36.8 C) (Oral)   Resp 18   LMP 12/21/2018    SpO2 100%   Visual Acuity Right Eye Distance:   Left Eye Distance:   Bilateral Distance:    Right Eye Near:   Left Eye Near:    Bilateral Near:     Physical Exam Constitutional:      General: She is not in acute distress.    Appearance: She is well-developed.  HENT:     Head: Normocephalic and atraumatic.  Eyes:     Conjunctiva/sclera: Conjunctivae normal.     Pupils: Pupils are equal, round, and reactive to light.  Neck:     Musculoskeletal: Normal range of motion.     Vascular: No carotid bruit.  Cardiovascular:     Rate and Rhythm: Normal rate and regular rhythm.     Heart sounds: Normal heart sounds.     Comments: Pedal pulses are 1+ bilaterally Pulmonary:     Effort: Pulmonary effort is normal. No respiratory distress.     Breath sounds: Normal breath sounds. No wheezing.  Abdominal:     General: There is no distension.     Palpations: Abdomen is soft.  Musculoskeletal: Normal range of motion.  Lymphadenopathy:     Cervical: No cervical adenopathy.  Skin:    General: Skin is warm and dry.     Capillary Refill: Capillary refill takes 2 to 3 seconds.     Comments: Skin demonstrates mottling from the groin to the ankles  Neurological:     Mental Status: She is alert.  Psychiatric:     Comments: Poor historian/poor fund of knowledge      UC Treatments / Results  Labs (all labs ordered are listed, but only abnormal results are displayed) Labs Reviewed  CBC WITH DIFFERENTIAL/PLATELET  SEDIMENTATION RATE    EKG None  Radiology No results found.  Procedures Procedures (including critical care time)  Medications Ordered in UC Medications - No data to display  Initial Impression / Assessment and Plan / UC Course  I have reviewed the triage vital signs and the nursing notes.  Pertinent labs & imaging results that were available during my care of the patient were reviewed by me and considered in my medical decision making (see chart for details).      I have concern regarding vascular compromise, likely intra-abdominal to cause the skin changes peripherally.  I am sending  her to the emergency room for additional evaluation. Final Clinical Impressions(s) / UC Diagnoses   Final diagnoses:  Peripheral vascular complication     Discharge Instructions     Need to go to ER for additional evaluation   ED Prescriptions    None     Controlled Substance Prescriptions [place] Controlled Substance Registry consulted? Not Applicable   Raylene Everts, MD 01/11/19 1115

## 2019-03-06 ENCOUNTER — Encounter (HOSPITAL_COMMUNITY): Payer: Self-pay | Admitting: *Deleted

## 2019-03-06 ENCOUNTER — Emergency Department (HOSPITAL_COMMUNITY)
Admission: EM | Admit: 2019-03-06 | Discharge: 2019-03-06 | Disposition: A | Discharge status: Home / Self Care | Payer: Medicaid Other | Attending: Emergency Medicine | Admitting: Emergency Medicine

## 2019-03-06 ENCOUNTER — Other Ambulatory Visit: Payer: Self-pay

## 2019-03-06 DIAGNOSIS — Z79899 Other long term (current) drug therapy: Secondary | ICD-10-CM | POA: Diagnosis not present

## 2019-03-06 DIAGNOSIS — Z7982 Long term (current) use of aspirin: Secondary | ICD-10-CM | POA: Diagnosis not present

## 2019-03-06 DIAGNOSIS — I10 Essential (primary) hypertension: Secondary | ICD-10-CM | POA: Insufficient documentation

## 2019-03-06 DIAGNOSIS — J45909 Unspecified asthma, uncomplicated: Secondary | ICD-10-CM | POA: Insufficient documentation

## 2019-03-06 DIAGNOSIS — Z8673 Personal history of transient ischemic attack (TIA), and cerebral infarction without residual deficits: Secondary | ICD-10-CM | POA: Insufficient documentation

## 2019-03-06 DIAGNOSIS — M436 Torticollis: Secondary | ICD-10-CM | POA: Insufficient documentation

## 2019-03-06 DIAGNOSIS — M542 Cervicalgia: Secondary | ICD-10-CM | POA: Diagnosis present

## 2019-03-06 MED ORDER — FUTURO SOFT CERVICAL COLLAR MISC: 1.0000 | Freq: Every day | 0 refills | Status: DC | PRN

## 2019-03-06 MED ORDER — METHOCARBAMOL 500 MG PO TABS: 500.0000 mg | ORAL_TABLET | Freq: Three times a day (TID) | ORAL | 0 refills | Status: DC | PRN

## 2019-03-06 MED ORDER — NAPROXEN 375 MG PO TABS: 375.0000 mg | ORAL_TABLET | Freq: Two times a day (BID) | ORAL | 0 refills | Status: DC

## 2019-03-06 NOTE — ED Notes (Signed)
Patient verbalizes understanding of discharge instructions . Opportunity for questions and answers were provided . Armband removed by staff ,Pt discharged from ED. W/C  offered at D/C  and Declined W/C at D/C and was escorted to lobby by RN.  

## 2019-03-06 NOTE — Discharge Instructions (Signed)
It would be helpful to buy lidocaine 4% patches available over the counter. You may ask the pharmacist to help you find this. You may apply one patch to the side of your neck every 12 hours.

## 2019-03-06 NOTE — ED Provider Notes (Signed)
[facility] EMERGENCY DEPARTMENT Provider Note   CSN: [id number] Arrival date & time: 03/06/19  0818    History   Chief Complaint Chief Complaint  Patient presents with   Neck Pain    RT side    HPI KARLENE SOUTHARD is a 53 y.o. female.     ODETTA FORNESS is a 53 y.o. female who complains of neck pain for 2 day(s) (per patient) since sleeping on the opposite side of the bed.. The pain is positional with movement of neck without radiation of pain down the arms.   Symptoms have been constant since that time. Prior history of neck problems: recurrent self limited episodes of neck pain in the past. There is no numbness, tingling, weakness in the arms. She denies other neurologic sxs such as vertigo, severe headache, vision changes, disequilibrium or ataxia. She has no nausea or vomiting.    The history is provided by the patient and medical records.    Past Medical History:  Diagnosis Date   Asthma    Essential hypertension    Essential hypertension 05/26/2017   Insomnia    Manic depression ([facility])    Obesity    Restless leg syndrome    Stroke [facility])     Patient Active Problem List   Diagnosis Date Noted   Essential hypertension 05/26/2017   Hyperglycemia 05/26/2017   Microcytic anemia 05/26/2017   CVA (cerebral vascular accident) ([place]) 05/25/2017    Past Surgical History:  Procedure Laterality Date   BREAST LUMPECTOMY Left    CESAREAN SECTION     x3     OB History   No obstetric history on file.      Home Medications    Prior to Admission medications   Medication Sig Start Date End Date Taking? Authorizing Provider  Acetaminophen-Caff-Pyrilamine (MIDOL COMPLETE PO) Take 2 tablets by mouth every 8 (eight) hours as needed (for pain).    [provider]  albuterol (PROVENTIL HFA;VENTOLIN HFA) 108 (90 BASE) MCG/ACT inhaler Inhale 1-2 puffs into the lungs every 4 (four) hours as needed for wheezing or shortness of breath.  11/28/13   Liam Graham, PA-C  aspirin 325 MG tablet Take 1 tablet (325 mg total) by mouth daily. 05/28/17   Geradine Girt, DO  ferrous sulfate 325 (65 FE) MG tablet Take 1 tablet (325 mg total) by mouth daily. 01/11/19   Valarie Merino, MD    Family History Family History  Problem Relation Age of Onset   CAD Mother 22   Other Father 68       struck by lightning   Stroke Neg Hx     Social History Social History   Tobacco Use   Smoking status: Never Smoker   Smokeless tobacco: Never Used  Substance Use Topics   Alcohol use: No   Drug use: No     Allergies   Patient has no known allergies.   Review of Systems Review of Systems  Constitutional: Negative for chills and fever.  Musculoskeletal: Positive for neck pain and neck stiffness.  Skin: Negative for rash.  Neurological: Negative for dizziness, tremors, syncope, facial asymmetry, speech difficulty, weakness, light-headedness, numbness and headaches.     Physical Exam Updated Vital Signs Ht 5\' 4"  (1.626 m)    Wt 117.9 kg    BMI 44.63 kg/m   Physical Exam Vitals signs and nursing note reviewed.  Constitutional:      General: She is not in acute distress.  Appearance: She is well-developed. She is not diaphoretic.  HENT:     Head: Normocephalic and atraumatic.  Eyes:     General: No scleral icterus.    Conjunctiva/sclera: Conjunctivae normal.  Neck:     Musculoskeletal: Muscular tenderness present. No neck rigidity.     Vascular: No carotid bruit.     Comments: Point tenderness in the R trapezius. Tender in along the R posterior cervical paraspinal muscles. ROM limited in every direction due to pain. No midline tenderness, no left sided paraspinal tenderness.  Normal strength and sensation BL upper extremities. Cardiovascular:     Rate and Rhythm: Normal rate and regular rhythm.     Heart sounds: Normal heart sounds. No murmur. No friction rub. No gallop.   Pulmonary:     Effort: Pulmonary  effort is normal. No respiratory distress.     Breath sounds: Normal breath sounds.  Abdominal:     General: Bowel sounds are normal. There is no distension.     Palpations: Abdomen is soft. There is no mass.     Tenderness: There is no abdominal tenderness. There is no guarding.  Lymphadenopathy:     Cervical: No cervical adenopathy.  Skin:    General: Skin is warm and dry.  Neurological:     Mental Status: She is alert and oriented to person, place, and time.  Psychiatric:        Behavior: Behavior normal.      ED Treatments / Results  Labs (all labs ordered are listed, but only abnormal results are displayed) Labs Reviewed - No data to display  EKG None  Radiology No results found.  Procedures Procedures (including critical care time)  Medications Ordered in ED Medications - No data to display   Initial Impression / Assessment and Plan / ED Course  I have reviewed the triage vital signs and the nursing notes.  Pertinent labs & imaging results that were available during my care of the patient were reviewed by me and considered in my medical decision making (see chart for details).         patient with acute right sided torticollis. She has no neurologic abnormalities. Differential diagnosis includes but is not limited to vertebral artery dissection, acute cervical radiculopathy or myelopathy, injuries, or pathologic fracture. I have no concern that these are the cause of her sxs today. She has no known risk factors for pathologic fracture. I have ordereda short course of Naproxen 375 mg bid, robaxin 500 tid and cervical soft collar. Discussed course and return precautions. patient appears appropriated for discharge   Final Clinical Impressions(s) / ED Diagnoses   Final diagnoses:  None    ED Discharge Orders    None       Margarita Mail, PA-C 03/06/19 0857    Lajean Saver, MD 03/06/19 1521

## 2019-03-06 NOTE — ED Triage Notes (Signed)
PT reports 4 days of rt sided neck pain from sleeping on wrong side of bed.

## 2019-08-19 ENCOUNTER — Encounter (HOSPITAL_COMMUNITY): Payer: Self-pay

## 2019-08-19 ENCOUNTER — Observation Stay (HOSPITAL_COMMUNITY)
Admission: EM | Admit: 2019-08-19 | Discharge: 2019-08-21 | Disposition: A | Discharge status: Home / Self Care | Payer: Medicaid Other | Attending: Family Medicine | Admitting: Family Medicine

## 2019-08-19 ENCOUNTER — Emergency Department (HOSPITAL_COMMUNITY): Payer: Medicaid Other

## 2019-08-19 ENCOUNTER — Other Ambulatory Visit: Payer: Self-pay

## 2019-08-19 DIAGNOSIS — G4700 Insomnia, unspecified: Secondary | ICD-10-CM | POA: Diagnosis not present

## 2019-08-19 DIAGNOSIS — J45909 Unspecified asthma, uncomplicated: Secondary | ICD-10-CM | POA: Insufficient documentation

## 2019-08-19 DIAGNOSIS — Z7902 Long term (current) use of antithrombotics/antiplatelets: Secondary | ICD-10-CM | POA: Diagnosis not present

## 2019-08-19 DIAGNOSIS — I071 Rheumatic tricuspid insufficiency: Secondary | ICD-10-CM | POA: Diagnosis not present

## 2019-08-19 DIAGNOSIS — R42 Dizziness and giddiness: Secondary | ICD-10-CM

## 2019-08-19 DIAGNOSIS — Z791 Long term (current) use of non-steroidal anti-inflammatories (NSAID): Secondary | ICD-10-CM | POA: Diagnosis not present

## 2019-08-19 DIAGNOSIS — F418 Other specified anxiety disorders: Secondary | ICD-10-CM | POA: Diagnosis not present

## 2019-08-19 DIAGNOSIS — G459 Transient cerebral ischemic attack, unspecified: Principal | ICD-10-CM | POA: Insufficient documentation

## 2019-08-19 DIAGNOSIS — R739 Hyperglycemia, unspecified: Secondary | ICD-10-CM | POA: Diagnosis not present

## 2019-08-19 DIAGNOSIS — G2581 Restless legs syndrome: Secondary | ICD-10-CM | POA: Diagnosis not present

## 2019-08-19 DIAGNOSIS — G9389 Other specified disorders of brain: Secondary | ICD-10-CM | POA: Insufficient documentation

## 2019-08-19 DIAGNOSIS — F329 Major depressive disorder, single episode, unspecified: Secondary | ICD-10-CM | POA: Diagnosis not present

## 2019-08-19 DIAGNOSIS — Z79899 Other long term (current) drug therapy: Secondary | ICD-10-CM | POA: Insufficient documentation

## 2019-08-19 DIAGNOSIS — I10 Essential (primary) hypertension: Secondary | ICD-10-CM | POA: Insufficient documentation

## 2019-08-19 DIAGNOSIS — R200 Anesthesia of skin: Secondary | ICD-10-CM

## 2019-08-19 DIAGNOSIS — Z20828 Contact with and (suspected) exposure to other viral communicable diseases: Secondary | ICD-10-CM | POA: Insufficient documentation

## 2019-08-19 LAB — COMPREHENSIVE METABOLIC PANEL
ALT: 10 U/L (ref 0–44)
AST: 12 U/L — ABNORMAL LOW (ref 15–41)
Albumin: 3.3 g/dL — ABNORMAL LOW (ref 3.5–5.0)
Alkaline Phosphatase: 58 U/L (ref 38–126)
Anion gap: 10 (ref 5–15)
BUN: 12 mg/dL (ref 6–20)
CO2: 22 mmol/L (ref 22–32)
Calcium: 9.2 mg/dL (ref 8.9–10.3)
Chloride: 105 mmol/L (ref 98–111)
Creatinine, Ser: 0.8 mg/dL (ref 0.44–1.00)
GFR calc Af Amer: >60 mL/min (ref >60)
GFR calc non Af Amer: >60 mL/min (ref >60)
Glucose, Bld: 106 mg/dL — ABNORMAL HIGH (ref 70–99)
Potassium: 3.8 mmol/L (ref 3.5–5.1)
Sodium: 137 mmol/L (ref 135–145)
Total Bilirubin: 0.4 mg/dL (ref 0.3–1.2)
Total Protein: 6.7 g/dL (ref 6.5–8.1)

## 2019-08-19 LAB — I-STAT CHEM 8, ED
BUN: 13 mg/dL (ref 6–20)
Calcium, Ion: 1.22 mmol/L (ref 1.15–1.40)
Chloride: 104 mmol/L (ref 98–111)
Creatinine, Ser: 0.8 mg/dL (ref 0.44–1.00)
Glucose, Bld: 101 mg/dL — ABNORMAL HIGH (ref 70–99)
HCT: 30 % — ABNORMAL LOW (ref 36.0–46.0)
Hemoglobin: 10.2 g/dL — ABNORMAL LOW (ref 12.0–15.0)
Potassium: 3.9 mmol/L (ref 3.5–5.1)
Sodium: 139 mmol/L (ref 135–145)
TCO2: 22 mmol/L (ref 22–32)

## 2019-08-19 LAB — SARS CORONAVIRUS 2 (TAT 6-24 HRS): SARS Coronavirus 2: NEGATIVE

## 2019-08-19 LAB — DIFFERENTIAL
Abs Immature Granulocytes: 0 10*3/uL (ref 0.00–0.07)
Basophils Absolute: 0 10*3/uL (ref 0.0–0.1)
Basophils Relative: 0 %
Eosinophils Absolute: 0.1 10*3/uL (ref 0.0–0.5)
Eosinophils Relative: 1 %
Lymphocytes Relative: 24 %
Lymphs Abs: 2.1 10*3/uL (ref 0.7–4.0)
Monocytes Absolute: 0.2 10*3/uL (ref 0.1–1.0)
Monocytes Relative: 2 %
Neutro Abs: 6.4 10*3/uL (ref 1.7–7.7)
Neutrophils Relative %: 73 %
nRBC: 0 /100 WBC

## 2019-08-19 LAB — CBC
HCT: 29 % — ABNORMAL LOW (ref 36.0–46.0)
Hemoglobin: 8.3 g/dL — ABNORMAL LOW (ref 12.0–15.0)
MCH: 19.1 pg — ABNORMAL LOW (ref 26.0–34.0)
MCHC: 28.6 g/dL — ABNORMAL LOW (ref 30.0–36.0)
MCV: 66.7 fL — ABNORMAL LOW (ref 80.0–100.0)
Platelets: 406 10*3/uL — ABNORMAL HIGH (ref 150–400)
RBC: 4.35 MIL/uL (ref 3.87–5.11)
RDW: 19.9 % — ABNORMAL HIGH (ref 11.5–15.5)
WBC: 8.7 10*3/uL (ref 4.0–10.5)
nRBC: 0 % (ref 0.0–0.2)

## 2019-08-19 LAB — GLUCOSE, CAPILLARY: Glucose-Capillary: 98 mg/dL (ref 70–99)

## 2019-08-19 LAB — PROTIME-INR
INR: 1 (ref 0.8–1.2)
Prothrombin Time: 13.3 seconds (ref 11.4–15.2)

## 2019-08-19 LAB — APTT: aPTT: 27 seconds (ref 24–36)

## 2019-08-19 LAB — I-STAT BETA HCG BLOOD, ED (MC, WL, AP ONLY): I-stat hCG, quantitative: <5 m[IU]/mL (ref <5)

## 2019-08-19 LAB — CBG MONITORING, ED: Glucose-Capillary: 96 mg/dL (ref 70–99)

## 2019-08-19 MED ORDER — HYDROCHLOROTHIAZIDE 25 MG PO TABS
25.0000 mg | ORAL_TABLET | Freq: Every day | ORAL | Status: DC
  Filled 2019-08-19: qty 1

## 2019-08-19 MED ORDER — APAP-PAMABROM-PYRILAMINE 500-25-15 MG PO TABS: 1.0000 | ORAL_TABLET | Freq: Three times a day (TID) | ORAL | Status: DC | PRN

## 2019-08-19 MED ORDER — ALBUTEROL SULFATE (2.5 MG/3ML) 0.083% IN NEBU: 2.5000 mg | INHALATION_SOLUTION | RESPIRATORY_TRACT | Status: DC | PRN

## 2019-08-19 MED ORDER — QUETIAPINE FUMARATE 50 MG PO TABS
100.0000 mg | ORAL_TABLET | Freq: Every day | ORAL | Status: DC
  Administered 2019-08-19 – 2019-08-20 (×2): 100 mg via ORAL
  Filled 2019-08-19 (×2): qty 2

## 2019-08-19 MED ORDER — LISINOPRIL-HYDROCHLOROTHIAZIDE 20-25 MG PO TABS: 1.0000 | ORAL_TABLET | Freq: Every day | ORAL | Status: DC

## 2019-08-19 MED ORDER — TRAZODONE HCL 100 MG PO TABS
100.0000 mg | ORAL_TABLET | Freq: Every day | ORAL | Status: DC
  Administered 2019-08-19 – 2019-08-20 (×2): 100 mg via ORAL
  Filled 2019-08-19 (×2): qty 1

## 2019-08-19 MED ORDER — STROKE: EARLY STAGES OF RECOVERY BOOK
Freq: Once | Status: AC
  Administered 2019-08-19: 23:00:00
  Filled 2019-08-19: qty 1

## 2019-08-19 MED ORDER — ACETAMINOPHEN 325 MG PO TABS: 650.0000 mg | ORAL_TABLET | ORAL | Status: DC | PRN

## 2019-08-19 MED ORDER — ACETAMINOPHEN 160 MG/5ML PO SOLN: 650.0000 mg | ORAL | Status: DC | PRN

## 2019-08-19 MED ORDER — ACETAMINOPHEN 500 MG PO TABS: 500.0000 mg | ORAL_TABLET | Freq: Three times a day (TID) | ORAL | Status: DC | PRN

## 2019-08-19 MED ORDER — ROPINIROLE HCL 0.25 MG PO TABS
0.2500 mg | ORAL_TABLET | Freq: Every evening | ORAL | Status: DC
  Administered 2019-08-20: 0.25 mg via ORAL
  Filled 2019-08-19: qty 1

## 2019-08-19 MED ORDER — ASPIRIN 325 MG PO TABS
325.0000 mg | ORAL_TABLET | Freq: Every day | ORAL | Status: DC
  Administered 2019-08-20: 325 mg via ORAL
  Filled 2019-08-19: qty 1

## 2019-08-19 MED ORDER — FERROUS SULFATE 325 (65 FE) MG PO TABS
325.0000 mg | ORAL_TABLET | Freq: Every day | ORAL | Status: DC
  Administered 2019-08-20 – 2019-08-21 (×2): 325 mg via ORAL
  Filled 2019-08-19 (×2): qty 1

## 2019-08-19 MED ORDER — LISINOPRIL 20 MG PO TABS
20.0000 mg | ORAL_TABLET | Freq: Every day | ORAL | Status: DC
  Filled 2019-08-19: qty 1

## 2019-08-19 MED ORDER — IBUPROFEN 200 MG PO TABS: 400.0000 mg | ORAL_TABLET | Freq: Four times a day (QID) | ORAL | Status: DC | PRN

## 2019-08-19 MED ORDER — CAFFEINE 200 MG PO TABS: 200.0000 mg | ORAL_TABLET | Freq: Four times a day (QID) | ORAL | Status: DC | PRN

## 2019-08-19 MED ORDER — SENNOSIDES-DOCUSATE SODIUM 8.6-50 MG PO TABS: 1.0000 | ORAL_TABLET | Freq: Every evening | ORAL | Status: DC | PRN

## 2019-08-19 MED ORDER — SODIUM CHLORIDE 0.9% FLUSH
3.0000 mL | Freq: Once | INTRAVENOUS | Status: AC
  Administered 2019-08-19: 3 mL via INTRAVENOUS

## 2019-08-19 MED ORDER — SODIUM CHLORIDE 0.9 % IV SOLN
INTRAVENOUS | Status: DC
  Administered 2019-08-19 – 2019-08-21 (×3): via INTRAVENOUS

## 2019-08-19 MED ORDER — ACETAMINOPHEN 650 MG RE SUPP: 650.0000 mg | RECTAL | Status: DC | PRN

## 2019-08-19 MED ORDER — IOHEXOL 350 MG/ML SOLN
75.0000 mL | Freq: Once | INTRAVENOUS | Status: AC | PRN
  Administered 2019-08-19: 19:00:00 75 mL via INTRAVENOUS

## 2019-08-19 MED ORDER — ASPIRIN 300 MG RE SUPP: 300.0000 mg | Freq: Every day | RECTAL | Status: DC

## 2019-08-19 MED ORDER — ENOXAPARIN SODIUM 40 MG/0.4ML ~~LOC~~ SOLN
40.0000 mg | SUBCUTANEOUS | Status: DC
  Administered 2019-08-19: 40 mg via SUBCUTANEOUS
  Filled 2019-08-19: qty 0.4

## 2019-08-19 MED ORDER — ATORVASTATIN CALCIUM 40 MG PO TABS
40.0000 mg | ORAL_TABLET | Freq: Every day | ORAL | Status: DC
  Administered 2019-08-20: 40 mg via ORAL
  Filled 2019-08-19: qty 1

## 2019-08-19 NOTE — ED Notes (Signed)
Activated code stroke with [facility] 

## 2019-08-19 NOTE — Progress Notes (Signed)
Isolated left hand numbness, no drift, no neglect, no hemianopia, no aphasia. She is not a tPA candidate due to mild symptoms. Full evaluation to be performed by Dr. Lorraine Lax.   Roland Rack, MD [facility] [phone]  If 7pm- 7am, please page neurology on call as listed in [place].

## 2019-08-19 NOTE — ED Provider Notes (Signed)
[place] EMERGENCY DEPARTMENT Provider Note   CSN: [id number] Arrival date & time: 08/19/19  1827  An emergency department physician performed an initial assessment on this suspected stroke patient at 1849.  History   Chief Complaint Chief Complaint  Patient presents with   Code Stroke   Numbness    HPI Sarah Bean is a 53 y.o. female.     The history is provided by the patient and medical records. No language interpreter was used.   Sarah Bean is a 53 y.o. female who presents to the Emergency Department complaining of numbness and dizziness. She presents the emergency department complaining of numbness and dizziness that began abruptly at 530 this evening. She states that she developed numbness and tingling in her left hand as well as a sensation of dizziness that is worse with standing and movement. She feels like she might pass out at times. She denies any fevers, chest pain, shortness of breath. She denies any leg swelling or pain. No known COVID 19 exposures. She does have a history of stroke two years ago with similar symptoms of dizziness. Past Medical History:  Diagnosis Date   Asthma    Essential hypertension    Essential hypertension 05/26/2017   Insomnia    Manic depression (HCC)    Obesity    Restless leg syndrome    Stroke [facility])     Patient Active Problem List   Diagnosis Date Noted   TIA (transient ischemic attack) 08/19/2019   Morbid obesity ([place]) 08/19/2019   Essential hypertension 05/26/2017   Hyperglycemia 05/26/2017   Microcytic anemia 05/26/2017   CVA (cerebral vascular accident) ([place]) 05/25/2017    Past Surgical History:  Procedure Laterality Date   BREAST LUMPECTOMY Left    CESAREAN SECTION     x3     OB History   No obstetric history on file.      Home Medications    Prior to Admission medications   Medication Sig Start Date End Date Taking? Authorizing Provider  acetaminophen (TYLENOL)  500 MG tablet Take 500-1,000 mg by mouth every 8 (eight) hours as needed for mild pain or headache.   Yes [provider]  albuterol (PROVENTIL HFA;VENTOLIN HFA) 108 (90 BASE) MCG/ACT inhaler Inhale 1-2 puffs into the lungs every 4 (four) hours as needed for wheezing or shortness of breath. 11/28/13  Yes Baker, Zachary H, PA-C  APAP-Pamabrom-Pyrilamine (PAMPRIN MULTI-SYMPTOM) 500-25-15 MG TABS Take 1-2 tablets by mouth every 8 (eight) hours as needed (for cramping or discomfort).    Yes [provider]  caffeine (VIVARIN) 200 MG TABS tablet Take 200 mg by mouth every 6 (six) hours as needed (for alertness).    Yes [provider]  ferrous sulfate 325 (65 FE) MG tablet Take 1 tablet (325 mg total) by mouth daily. 01/11/19  Yes Valarie Merino, MD  ibuprofen (ADVIL) 200 MG tablet Take 400 mg by mouth every 6 (six) hours as needed for headache or mild pain.   Yes [provider]  lisinopril-hydrochlorothiazide (ZESTORETIC) 20-25 MG tablet [name]   Yes [provider]  QUEtiapine (SEROQUEL) 100 MG tablet Take 100 mg by mouth at bedtime.   Yes [provider]  traZODone (DESYREL) 100 MG tablet Take 100 mg by mouth at bedtime.   Yes [provider]  aspirin 325 MG tablet Take 1 tablet (325 mg total) by mouth daily. Patient not taking: Reported on 08/19/2019 05/28/17  Geradine Girt, DO  Elastic Bandages & Supports (FUTURO SOFT CERVICAL COLLAR) MISC 1 Device by Does not apply route daily as needed. 03/06/19   Harris, Vernie Shanks, PA-C  methocarbamol (ROBAXIN) 500 MG tablet Take 1 tablet (500 mg total) by mouth 3 (three) times daily as needed for muscle spasms. Patient not taking: Reported on 08/19/2019 03/06/19   Margarita Mail, PA-C  naproxen (NAPROSYN) 375 MG tablet Take 1 tablet (375 mg total) by mouth 2 (two) times daily. Patient not taking: Reported on 08/19/2019 03/06/19   Margarita Mail, PA-C  rOPINIRole (REQUIP) 0.25 MG  tablet Take 0.25 mg by mouth See admin instructions. Take 0.25 mg by mouth 1-3 hours before bedtime    [provider]    Family History Family History  Problem Relation Age of Onset   CAD Mother 30   Other Father 32       struck by lightning   Stroke Neg Hx     Social History Social History   Tobacco Use   Smoking status: Never Smoker   Smokeless tobacco: Never Used  Substance Use Topics   Alcohol use: No   Drug use: No     Allergies   Patient has no known allergies.   Review of Systems Review of Systems  All other systems reviewed and are negative.    Physical Exam Updated Vital Signs BP 124/82    Pulse 64    Temp 98.4 F (36.9 C) (Axillary)    Resp 16    SpO2 100%   Physical Exam Vitals signs and nursing note reviewed.  Constitutional:      Appearance: She is well-developed.  HENT:     Head: Normocephalic and atraumatic.  Cardiovascular:     Rate and Rhythm: Normal rate and regular rhythm.     Heart sounds: No murmur.  Pulmonary:     Effort: Pulmonary effort is normal. No respiratory distress.     Breath sounds: Normal breath sounds.  Abdominal:     Palpations: Abdomen is soft.     Tenderness: There is no abdominal tenderness. There is no guarding or rebound.  Musculoskeletal:        General: No tenderness.  Skin:    General: Skin is warm and dry.  Neurological:     Mental Status: She is alert and oriented to person, place, and time.     Comments: Visual fields are grossly intact. No asymmetry of facial movements. Five out of five strength in all four extremities with sensation to light touch intact in all four extremities.  Psychiatric:        Behavior: Behavior normal.      ED Treatments / Results  Labs (all labs ordered are listed, but only abnormal results are displayed) Labs Reviewed  CBC - Abnormal; Notable for the following components:      Result Value   Hemoglobin 8.3 (*)    HCT 29.0 (*)    MCV 66.7 (*)    MCH  19.1 (*)    MCHC 28.6 (*)    RDW 19.9 (*)    Platelets 406 (*)    All other components within normal limits  COMPREHENSIVE METABOLIC PANEL - Abnormal; Notable for the following components:   Glucose, Bld 106 (*)    Albumin 3.3 (*)    AST 12 (*)    All other components within normal limits  I-STAT CHEM 8, ED - Abnormal; Notable for the following components:   Glucose, Bld 101 (*)  Hemoglobin 10.2 (*)    HCT 30.0 (*)    All other components within normal limits  SARS CORONAVIRUS 2 (TAT 6-24 HRS)  PROTIME-INR  APTT  DIFFERENTIAL  GLUCOSE, CAPILLARY  HIV ANTIBODY (ROUTINE TESTING W REFLEX)  HEMOGLOBIN A1C  LIPID PANEL  CBG MONITORING, ED  CBG MONITORING, ED  I-STAT BETA HCG BLOOD, ED ([facility], [facility], [facility] ONLY)    EKG EKG Interpretation  Date/Time:  Thursday August 19 2019 18:41:27 EDT Ventricular Rate:  104 PR Interval:  134 QRS Duration: 82 QT Interval:  330 QTC Calculation: 433 R Axis:   91 Text Interpretation: Sinus tachycardia Rightward axis Nonspecific ST abnormality Abnormal ECG Confirmed by Quintella Reichert [phone]) on 08/19/2019 7:29:01 PM   Radiology Ct Angio Head W Or Wo Contrast  Result Date: 08/19/2019 CLINICAL DATA:  Left hand numbness EXAM: CT ANGIOGRAPHY HEAD AND NECK TECHNIQUE: Multidetector CT imaging of the head and neck was performed using the standard protocol during bolus administration of intravenous contrast. Multiplanar CT image reconstructions and MIPs were obtained to evaluate the vascular anatomy. Carotid stenosis measurements (when applicable) are obtained utilizing NASCET criteria, using the distal internal carotid diameter as the denominator. CONTRAST:  23mL OMNIPAQUE IOHEXOL 350 MG/ML SOLN COMPARISON:  Head CT same day FINDINGS: CTA NECK FINDINGS SKELETON: There is no bony spinal canal stenosis. No lytic or blastic lesion. OTHER NECK: Normal pharynx, larynx and major salivary glands. No cervical lymphadenopathy. Unremarkable thyroid gland. UPPER CHEST:  No pneumothorax or pleural effusion. No nodules or masses. AORTIC ARCH: There is no calcific atherosclerosis of the aortic arch. There is no aneurysm, dissection or hemodynamically significant stenosis of the visualized portion of the aorta. Conventional 3 vessel aortic branching pattern. The visualized proximal subclavian arteries are widely patent. RIGHT CAROTID SYSTEM: Normal without aneurysm, dissection or stenosis. LEFT CAROTID SYSTEM: Normal without aneurysm, dissection or stenosis. VERTEBRAL ARTERIES: Left dominant configuration. Both origins are clearly patent. There is no dissection, occlusion or flow-limiting stenosis to the skull base (V1-V3 segments). CTA HEAD FINDINGS POSTERIOR CIRCULATION: --Vertebral arteries: Normal V4 segments. --Posterior inferior cerebellar arteries (PICA): Patent origins from the vertebral arteries. --Anterior inferior cerebellar arteries (AICA): Patent origins from the basilar artery. --Basilar artery: Normal. --Superior cerebellar arteries: Normal. --Posterior cerebral arteries: Normal. The right PCA is predominantly supplied by the posterior communicating artery. ANTERIOR CIRCULATION: --Intracranial internal carotid arteries: Normal. --Anterior cerebral arteries (ACA): Normal. Both A1 segments are present. Patent anterior communicating artery (a-comm). --Middle cerebral arteries (MCA): Normal. VENOUS SINUSES: As permitted by contrast timing, patent. ANATOMIC VARIANTS: None Review of the MIP images confirms the above findings. IMPRESSION: Normal CTA of the head and neck. Electronically Signed   By: Ulyses Jarred M.D.   On: 08/19/2019 19:21   Ct Angio Neck W Or Wo Contrast  Result Date: 08/19/2019 CLINICAL DATA:  Left hand numbness EXAM: CT ANGIOGRAPHY HEAD AND NECK TECHNIQUE: Multidetector CT imaging of the head and neck was performed using the standard protocol during bolus administration of intravenous contrast. Multiplanar CT image reconstructions and MIPs were obtained  to evaluate the vascular anatomy. Carotid stenosis measurements (when applicable) are obtained utilizing NASCET criteria, using the distal internal carotid diameter as the denominator. CONTRAST:  84mL OMNIPAQUE IOHEXOL 350 MG/ML SOLN COMPARISON:  Head CT same day FINDINGS: CTA NECK FINDINGS SKELETON: There is no bony spinal canal stenosis. No lytic or blastic lesion. OTHER NECK: Normal pharynx, larynx and major salivary glands. No cervical lymphadenopathy. Unremarkable thyroid gland. UPPER CHEST: No pneumothorax or pleural effusion. No nodules  or masses. AORTIC ARCH: There is no calcific atherosclerosis of the aortic arch. There is no aneurysm, dissection or hemodynamically significant stenosis of the visualized portion of the aorta. Conventional 3 vessel aortic branching pattern. The visualized proximal subclavian arteries are widely patent. RIGHT CAROTID SYSTEM: Normal without aneurysm, dissection or stenosis. LEFT CAROTID SYSTEM: Normal without aneurysm, dissection or stenosis. VERTEBRAL ARTERIES: Left dominant configuration. Both origins are clearly patent. There is no dissection, occlusion or flow-limiting stenosis to the skull base (V1-V3 segments). CTA HEAD FINDINGS POSTERIOR CIRCULATION: --Vertebral arteries: Normal V4 segments. --Posterior inferior cerebellar arteries (PICA): Patent origins from the vertebral arteries. --Anterior inferior cerebellar arteries (AICA): Patent origins from the basilar artery. --Basilar artery: Normal. --Superior cerebellar arteries: Normal. --Posterior cerebral arteries: Normal. The right PCA is predominantly supplied by the posterior communicating artery. ANTERIOR CIRCULATION: --Intracranial internal carotid arteries: Normal. --Anterior cerebral arteries (ACA): Normal. Both A1 segments are present. Patent anterior communicating artery (a-comm). --Middle cerebral arteries (MCA): Normal. VENOUS SINUSES: As permitted by contrast timing, patent. ANATOMIC VARIANTS: None Review of  the MIP images confirms the above findings. IMPRESSION: Normal CTA of the head and neck. Electronically Signed   By: Ulyses Jarred M.D.   On: 08/19/2019 19:21   Ct Head Code Stroke Wo Contrast  Result Date: 08/19/2019 CLINICAL DATA:  Code stroke.  Left hand numbness and dizziness EXAM: CT HEAD WITHOUT CONTRAST TECHNIQUE: Contiguous axial images were obtained from the base of the skull through the vertex without intravenous contrast. COMPARISON:  Head CT 06/27/2017 FINDINGS: Brain: There is no mass, hemorrhage or extra-axial collection. There has been progression of left occipital lobe encephalomalacia. No evidence of acute cortical infarct. Vascular: No abnormal hyperdensity of the major intracranial arteries or dural venous sinuses. No intracranial atherosclerosis. Skull: The visualized skull base, calvarium and extracranial soft tissues are normal. Sinuses/Orbits: No fluid levels or advanced mucosal thickening of the visualized paranasal sinuses. No mastoid or middle ear effusion. The orbits are normal. ASPECTS [facility] Stroke Program Early CT Score) - Ganglionic level infarction (caudate, lentiform nuclei, internal capsule, insula, M1-M3 cortex): 7 - Supraganglionic infarction (M4-M6 cortex): 3 Total score (0-10 with 10 being normal): 10 IMPRESSION: 1. No acute intracranial abnormality. 2. Progression of left occipital lobe encephalomalacia. 3. ASPECTS is 10. * These results were communicated to Dr. Roland Rack at 7:07 pm on 08/19/2019 by text page via the [facility] messaging system. Electronically Signed   By: Ulyses Jarred M.D.   On: 08/19/2019 19:07    Procedures Procedures (including critical care time)  Medications Ordered in ED Medications  albuterol (PROVENTIL) (2.5 MG/3ML) 0.083% nebulizer solution 2.5 mg (has no administration in time range)  ferrous sulfate tablet 325 mg (has no administration in time range)  rOPINIRole (REQUIP) tablet 0.25 mg (has no administration in time range)    QUEtiapine (SEROQUEL) tablet 100 mg (100 mg Oral Given 08/19/19 2244)  traZODone (DESYREL) tablet 100 mg (100 mg Oral Given 08/19/19 2244)  ibuprofen (ADVIL) tablet 400 mg (has no administration in time range)  0.9 %  sodium chloride infusion ( Intravenous New Bag/Given 08/19/19 2303)  acetaminophen (TYLENOL) tablet 650 mg (has no administration in time range)    Or  acetaminophen (TYLENOL) 160 MG/5ML solution 650 mg (has no administration in time range)    Or  acetaminophen (TYLENOL) suppository 650 mg (has no administration in time range)  senna-docusate (Senokot-S) tablet 1 tablet (has no administration in time range)  enoxaparin (LOVENOX) injection 40 mg (40 mg Subcutaneous Given 08/19/19 2243)  aspirin suppository 300 mg (has no administration in time range)    Or  aspirin tablet 325 mg (has no administration in time range)  atorvastatin (LIPITOR) tablet 40 mg (has no administration in time range)  lisinopril (ZESTRIL) tablet 20 mg (has no administration in time range)    And  hydrochlorothiazide (HYDRODIURIL) tablet 25 mg (has no administration in time range)  sodium chloride flush (NS) 0.9 % injection 3 mL (3 mLs Intravenous Given 08/19/19 2245)  iohexol (OMNIPAQUE) 350 MG/ML injection 75 mL (75 mLs Intravenous Contrast Given 08/19/19 1908)   stroke: mapping our early stages of recovery book ( Does not apply Given 08/19/19 2243)     Initial Impression / Assessment and Plan / ED Course  I have reviewed the triage vital signs and the nursing notes.  Pertinent labs & imaging results that were available during my care of the patient were reviewed by me and considered in my medical decision making (see chart for details).        Patient here for evaluation of dizziness, left arm numbness that started one hour prior to ED arrival. She has no focal neurologic deficits on evaluation but does still have objective paresthesias to the left upper extremity. A code stroke was activated  on ED arrival. She was evaluated by neurology and felt not to be a TPA candidate due to her low NIH score, improving symptoms. Hospitalist consulted for admission for further evaluation and workup.  Final Clinical Impressions(s) / ED Diagnoses   Final diagnoses:  None    ED Discharge Orders    None       Quintella Reichert, MD 08/20/19 Benancio Deeds

## 2019-08-19 NOTE — Consult Note (Signed)
Requesting Physician: Dr. Ralene Bathe    Chief Complaint: Dizziness, left hand numbness  History obtained from: Patient and Chart    HPI:                                                                                                                                       Sarah Bean is a 53 y.o. female with past medical history history significant for morbid obesity, hypertension, restless leg syndrome and prior stroke/TIA presents to the emergency department after sudden onset dizziness that began early in the afternoon.  Last known normal is unclear, initially thought to be 5:30 PM.  Patient states that she was in her normal state of health when she suddenly felt lightheaded and dizzy early in the afternoon and laid on the bed and took a nap.  She states that she felt she was spinning, but the room was not spinning.  She denies double vision or slurred speech.  She woke up around 5:30 PM and try to get up from bed she felt the dizziness come on again and this time is associated With isolated numbness in the left.  She feels generally weak but denies any limb weakness.  She is not on any antiplatelet.  Blood pressure here was [id number] systolic.  Code stroke was activated in the ER.  A stat CT head was obtained which showed no acute findings.  There is however a left occipital lobe encephalomalacia patient initially seen by Dr. Leonel Ramsay, TPA was not administered as symptoms are mild and nondisabling.  CT angiogram was performed which showed no large vessel occlusion.  On my assessment, NIH stroke scale 0.   Date last known well: 10.29. 20 Time last known well: Before 5:30 PM tPA Given: No, mild nondisabling deficits as always unclear NIHSS: 0 Baseline MRS 0    Past Medical History:  Diagnosis Date  . Asthma   . Essential hypertension   . Essential hypertension 05/26/2017  . Insomnia   . Manic depression ([place])   . Obesity   . Restless leg syndrome   . Stroke [facility])     Past Surgical History:   Procedure Laterality Date  . BREAST LUMPECTOMY Left   . CESAREAN SECTION     x3    Family History  Problem Relation Age of Onset  . CAD Mother 45  . Other Father 18       struck by lightning  . Stroke Neg Hx    Social History:  reports that she has never smoked. She has never used smokeless tobacco. She reports that she does not drink alcohol or use drugs.  Allergies: No Known Allergies  Medications:  I reviewed home medications   ROS:                                                                                                                                     14 systems reviewed and negative except above    Examination:                                                                                                      General: Appears well-developed . Psych: Affect appropriate to situation Eyes: No scleral injection HENT: No OP obstrucion Head: Normocephalic.  Cardiovascular: Normal rate and regular rhythm. Respiratory: Effort normal and breath sounds normal to anterior ascultation GI: Soft.  No distension. There is no tenderness.  Skin: WDI    Neurological Examination Mental Status: Alert, oriented, thought content appropriate.  Speech fluent without evidence of aphasia. Able to follow 3 step commands without difficulty. Cranial Nerves: II: Visual fields grossly normal,  III,IV, VI: ptosis not present, extra-ocular motions intact bilaterally, pupils equal, round, reactive to light and accommodation V,VII: smile symmetric, facial light touch sensation normal bilaterally VIII: hearing normal bilaterally IX,X: uvula rises symmetrically XI: bilateral shoulder shrug XII: midline tongue extension Motor: Right : Upper extremity   5/5    Left:     Upper extremity   5/5  Lower extremity   5/5     Lower extremity   5/5 Tone and bulk:normal tone  throughout; no atrophy noted Sensory: Pinprick and light touch intact throughout, paresthesias over her left hand Deep Tendon Reflexes: 2+ and symmetric throughout Plantars: Right: downgoing   Left: downgoing Cerebellar: normal finger-to-nose, normal rapid alternating movements and normal heel-to-shin test Gait: normal gait and station     Lab Results: Basic Metabolic Panel: Recent Labs  Lab 08/19/19 1847 08/19/19 1854  NA 137 139  K 3.8 3.9  CL 105 104  CO2 22  --   GLUCOSE 106* 101*  BUN 12 13  CREATININE 0.80 0.80  CALCIUM 9.2  --     CBC: Recent Labs  Lab 08/19/19 1847 08/19/19 1854  WBC 8.7  --   NEUTROABS 6.4  --   HGB 8.3* 10.2*  HCT 29.0* 30.0*  MCV 66.7*  --   PLT 406*  --     Coagulation Studies: Recent Labs    08/19/19 1847  LABPROT 13.3  INR 1.0    Imaging: Ct Angio Head W Or Wo Contrast  Result Date: 08/19/2019 CLINICAL DATA:  Left hand numbness EXAM:  CT ANGIOGRAPHY HEAD AND NECK TECHNIQUE: Multidetector CT imaging of the head and neck was performed using the standard protocol during bolus administration of intravenous contrast. Multiplanar CT image reconstructions and MIPs were obtained to evaluate the vascular anatomy. Carotid stenosis measurements (when applicable) are obtained utilizing NASCET criteria, using the distal internal carotid diameter as the denominator. CONTRAST:  31mL OMNIPAQUE IOHEXOL 350 MG/ML SOLN COMPARISON:  Head CT same day FINDINGS: CTA NECK FINDINGS SKELETON: There is no bony spinal canal stenosis. No lytic or blastic lesion. OTHER NECK: Normal pharynx, larynx and major salivary glands. No cervical lymphadenopathy. Unremarkable thyroid gland. UPPER CHEST: No pneumothorax or pleural effusion. No nodules or masses. AORTIC ARCH: There is no calcific atherosclerosis of the aortic arch. There is no aneurysm, dissection or hemodynamically significant stenosis of the visualized portion of the aorta. Conventional 3 vessel aortic  branching pattern. The visualized proximal subclavian arteries are widely patent. RIGHT CAROTID SYSTEM: Normal without aneurysm, dissection or stenosis. LEFT CAROTID SYSTEM: Normal without aneurysm, dissection or stenosis. VERTEBRAL ARTERIES: Left dominant configuration. Both origins are clearly patent. There is no dissection, occlusion or flow-limiting stenosis to the skull base (V1-V3 segments). CTA HEAD FINDINGS POSTERIOR CIRCULATION: --Vertebral arteries: Normal V4 segments. --Posterior inferior cerebellar arteries (PICA): Patent origins from the vertebral arteries. --Anterior inferior cerebellar arteries (AICA): Patent origins from the basilar artery. --Basilar artery: Normal. --Superior cerebellar arteries: Normal. --Posterior cerebral arteries: Normal. The right PCA is predominantly supplied by the posterior communicating artery. ANTERIOR CIRCULATION: --Intracranial internal carotid arteries: Normal. --Anterior cerebral arteries (ACA): Normal. Both A1 segments are present. Patent anterior communicating artery (a-comm). --Middle cerebral arteries (MCA): Normal. VENOUS SINUSES: As permitted by contrast timing, patent. ANATOMIC VARIANTS: None Review of the MIP images confirms the above findings. IMPRESSION: Normal CTA of the head and neck. Electronically Signed   By: Ulyses Jarred M.D.   On: 08/19/2019 19:21   Ct Angio Neck W Or Wo Contrast  Result Date: 08/19/2019 CLINICAL DATA:  Left hand numbness EXAM: CT ANGIOGRAPHY HEAD AND NECK TECHNIQUE: Multidetector CT imaging of the head and neck was performed using the standard protocol during bolus administration of intravenous contrast. Multiplanar CT image reconstructions and MIPs were obtained to evaluate the vascular anatomy. Carotid stenosis measurements (when applicable) are obtained utilizing NASCET criteria, using the distal internal carotid diameter as the denominator. CONTRAST:  25mL OMNIPAQUE IOHEXOL 350 MG/ML SOLN COMPARISON:  Head CT same day  FINDINGS: CTA NECK FINDINGS SKELETON: There is no bony spinal canal stenosis. No lytic or blastic lesion. OTHER NECK: Normal pharynx, larynx and major salivary glands. No cervical lymphadenopathy. Unremarkable thyroid gland. UPPER CHEST: No pneumothorax or pleural effusion. No nodules or masses. AORTIC ARCH: There is no calcific atherosclerosis of the aortic arch. There is no aneurysm, dissection or hemodynamically significant stenosis of the visualized portion of the aorta. Conventional 3 vessel aortic branching pattern. The visualized proximal subclavian arteries are widely patent. RIGHT CAROTID SYSTEM: Normal without aneurysm, dissection or stenosis. LEFT CAROTID SYSTEM: Normal without aneurysm, dissection or stenosis. VERTEBRAL ARTERIES: Left dominant configuration. Both origins are clearly patent. There is no dissection, occlusion or flow-limiting stenosis to the skull base (V1-V3 segments). CTA HEAD FINDINGS POSTERIOR CIRCULATION: --Vertebral arteries: Normal V4 segments. --Posterior inferior cerebellar arteries (PICA): Patent origins from the vertebral arteries. --Anterior inferior cerebellar arteries (AICA): Patent origins from the basilar artery. --Basilar artery: Normal. --Superior cerebellar arteries: Normal. --Posterior cerebral arteries: Normal. The right PCA is predominantly supplied by the posterior communicating artery. ANTERIOR CIRCULATION: --Intracranial internal carotid  arteries: Normal. --Anterior cerebral arteries (ACA): Normal. Both A1 segments are present. Patent anterior communicating artery (a-comm). --Middle cerebral arteries (MCA): Normal. VENOUS SINUSES: As permitted by contrast timing, patent. ANATOMIC VARIANTS: None Review of the MIP images confirms the above findings. IMPRESSION: Normal CTA of the head and neck. Electronically Signed   By: Ulyses Jarred M.D.   On: 08/19/2019 19:21   Ct Head Code Stroke Wo Contrast  Result Date: 08/19/2019 CLINICAL DATA:  Code stroke.  Left hand  numbness and dizziness EXAM: CT HEAD WITHOUT CONTRAST TECHNIQUE: Contiguous axial images were obtained from the base of the skull through the vertex without intravenous contrast. COMPARISON:  Head CT 06/27/2017 FINDINGS: Brain: There is no mass, hemorrhage or extra-axial collection. There has been progression of left occipital lobe encephalomalacia. No evidence of acute cortical infarct. Vascular: No abnormal hyperdensity of the major intracranial arteries or dural venous sinuses. No intracranial atherosclerosis. Skull: The visualized skull base, calvarium and extracranial soft tissues are normal. Sinuses/Orbits: No fluid levels or advanced mucosal thickening of the visualized paranasal sinuses. No mastoid or middle ear effusion. The orbits are normal. ASPECTS [facility] Stroke Program Early CT Score) - Ganglionic level infarction (caudate, lentiform nuclei, internal capsule, insula, M1-M3 cortex): 7 - Supraganglionic infarction (M4-M6 cortex): 3 Total score (0-10 with 10 being normal): 10 IMPRESSION: 1. No acute intracranial abnormality. 2. Progression of left occipital lobe encephalomalacia. 3. ASPECTS is 10. * These results were communicated to Dr. Roland Rack at 7:07 pm on 08/19/2019 by text page via the [facility] messaging system. Electronically Signed   By: Ulyses Jarred M.D.   On: 08/19/2019 19:07     ASSESSMENT AND PLAN  53 y.o. female with past medical history history significant for morbid obesity, hypertension, restless leg syndrome and prior stroke/TIA presents to the emergency department after sudden onset dizziness with associated left hand numbness.  Unclear if this represents a stroke however given her significant vascular risk factors recommend MRI brain.  Does not appear to be a typical presentation of peripheral vertigo as patient denies vertigo, no nystagmus seen and not worsened with position. Reviewed CT angiogram and concern about left PCA stenosis, not read on CT angiogram.   Discussed with the EDP and patient being admitted for TIA stroke work-up.   Recommendations MRI brain w/o contrast Consider TCD to evaluate for PCA stenosis Start patient on aspirin 81 mg a least for secondary prophylaxis of stroke given history of left occipital lobe infarct, will defer starting Plavix until MRI brain is obtained Permissive hypertension up to [id number] systolic Frequent neurochecks A1c and lipid profile  We will continue to follow and assess the patient    Jaynee Winters [facility] Pager Number [phone]

## 2019-08-19 NOTE — ED Triage Notes (Signed)
Pt reports left hand numbness and dizziness since 530pm today. Pt denies weakness, no facial droop or slurred speech. Pt has hx of prior stroke, not taking any blood thinners. Pt a.o. Code Stroke activated in triage.

## 2019-08-19 NOTE — H&P (Signed)
History and Physical   Sarah Bean [id number] DOB: 1966-08-04 DOA: 08/19/2019  Referring MD/NP/PA: Dr. Ralene Bathe  PCP: [facility]   Outpatient Specialists: None  Patient coming from: Home  Chief Complaint: Left hand numbness  HPI: Sarah Bean is a 53 y.o. female with medical history significant of hypertension, depression, previous CVA, asthma and morbid obesity who presented with dizziness and left hand numbness that started around 530 this evening.  She was brought in as a code stroke.  Evaluated and found to be having a low stroke score.  Not a candidate of TPA.  Patient however has risk factors for CVA.  She complained of dizziness worse with movement especially standing up.  Denied any focal weakness.  Patient also has some mild headache.  The tingling and numbness in the left hand has improved while in the ER.  Patient is being admitted for complete CVA work-up.  Initial head CT showed no acute findings..  ED Course: Temperature is 98.8 blood pressure 144/82 pulse 96 respirate of 16 oxygen sat 95% room air.  White count 8.7 hemoglobin 10.1 platelets 406.  Chemistry within normal.  COVID-19 so far negative.  CT head without contrast and CT angiogram of the head and neck showed no acute findings.  Neurology consulted and patient is being admitted for work-up  Review of Systems: As per HPI otherwise 10 point review of systems negative.    Past Medical History:  Diagnosis Date   Asthma    Essential hypertension    Essential hypertension 05/26/2017   Insomnia    Manic depression (HCC)    Obesity    Restless leg syndrome    Stroke [facility])     Past Surgical History:  Procedure Laterality Date   BREAST LUMPECTOMY Left    CESAREAN SECTION     x3     reports that she has never smoked. She has never used smokeless tobacco. She reports that she does not drink alcohol or use drugs.  No Known Allergies  Family History  Problem Relation Age  of Onset   CAD Mother 61   Other Father 41       struck by lightning   Stroke Neg Hx      Prior to Admission medications   Medication Sig Start Date End Date Taking? Authorizing Provider  acetaminophen (TYLENOL) 500 MG tablet Take 500-1,000 mg by mouth every 8 (eight) hours as needed for mild pain or headache.   Yes [provider]  albuterol (PROVENTIL HFA;VENTOLIN HFA) 108 (90 BASE) MCG/ACT inhaler Inhale 1-2 puffs into the lungs every 4 (four) hours as needed for wheezing or shortness of breath. 11/28/13  Yes Baker, Zachary H, PA-C  APAP-Pamabrom-Pyrilamine (PAMPRIN MULTI-SYMPTOM) 500-25-15 MG TABS Take 1-2 tablets by mouth every 8 (eight) hours as needed (for cramping or discomfort).    Yes [provider]  caffeine (VIVARIN) 200 MG TABS tablet Take 200 mg by mouth every 6 (six) hours as needed (for alertness).    Yes [provider]  ferrous sulfate 325 (65 FE) MG tablet Take 1 tablet (325 mg total) by mouth daily. 01/11/19  Yes Valarie Merino, MD  ibuprofen (ADVIL) 200 MG tablet Take 400 mg by mouth every 6 (six) hours as needed for headache or mild pain.   Yes [provider]  lisinopril-hydrochlorothiazide (ZESTORETIC) 20-25 MG tablet [name]   Yes [provider]  QUEtiapine (SEROQUEL) 100 MG tablet Take 100  mg by mouth at bedtime.   Yes [provider]  traZODone (DESYREL) 100 MG tablet Take 100 mg by mouth at bedtime.   Yes [provider]  aspirin 325 MG tablet Take 1 tablet (325 mg total) by mouth daily. Patient not taking: Reported on 08/19/2019 05/28/17   Geradine Girt, DO  Elastic Bandages & Supports (FUTURO SOFT CERVICAL COLLAR) MISC 1 Device by Does not apply route daily as needed. 03/06/19   Harris, Vernie Shanks, PA-C  methocarbamol (ROBAXIN) 500 MG tablet Take 1 tablet (500 mg total) by mouth 3 (three) times daily as needed for muscle spasms. Patient not taking: Reported on 08/19/2019 03/06/19    Margarita Mail, PA-C  naproxen (NAPROSYN) 375 MG tablet Take 1 tablet (375 mg total) by mouth 2 (two) times daily. Patient not taking: Reported on 08/19/2019 03/06/19   Margarita Mail, PA-C  rOPINIRole (REQUIP) 0.25 MG tablet Take 0.25 mg by mouth See admin instructions. Take 0.25 mg by mouth 1-3 hours before bedtime    [provider]    Physical Exam: Vitals:   08/19/19 1930 08/19/19 2000 08/19/19 2045 08/19/19 2308  BP: (!) 144/82 (!) 142/85 (!) 141/99 124/82  Pulse: 78 72 70 64  Resp: 16 14 14 16   Temp:    98.4 F (36.9 C)  TempSrc:    Axillary  SpO2: 98% 100% 99% 100%      Constitutional: NAD, morbidly obese Vitals:   08/19/19 1930 08/19/19 2000 08/19/19 2045 08/19/19 2308  BP: (!) 144/82 (!) 142/85 (!) 141/99 124/82  Pulse: 78 72 70 64  Resp: 16 14 14 16   Temp:    98.4 F (36.9 C)  TempSrc:    Axillary  SpO2: 98% 100% 99% 100%   Eyes: PERRL, lids and conjunctivae normal ENMT: Mucous membranes are moist. Posterior pharynx clear of any exudate or lesions.Normal dentition.  Neck: normal, supple, no masses, no thyromegaly Respiratory: clear to auscultation bilaterally, no wheezing, no crackles. Normal respiratory effort. No accessory muscle use.  Cardiovascular: Regular rate and rhythm, no murmurs / rubs / gallops. No extremity edema. 2+ pedal pulses. No carotid bruits.  Abdomen: no tenderness, no masses palpated. No hepatosplenomegaly. Bowel sounds positive.  Musculoskeletal: no clubbing / cyanosis. No joint deformity upper and lower extremities. Good ROM, no contractures. Normal muscle tone.  Skin: no rashes, lesions, ulcers. No induration Neurologic: CN 2-12 grossly intact. Sensation intact, DTR normal. Strength 5/5 in all 4.  Psychiatric: Normal judgment and insight. Alert and oriented x 3. Normal mood.     Labs on Admission: I have personally reviewed following labs and imaging studies  CBC: Recent Labs  Lab 08/19/19 1847 08/19/19 1854  WBC 8.7  --    NEUTROABS 6.4  --   HGB 8.3* 10.2*  HCT 29.0* 30.0*  MCV 66.7*  --   PLT 406*  --    Basic Metabolic Panel: Recent Labs  Lab 08/19/19 1847 08/19/19 1854  NA 137 139  K 3.8 3.9  CL 105 104  CO2 22  --   GLUCOSE 106* 101*  BUN 12 13  CREATININE 0.80 0.80  CALCIUM 9.2  --    GFR: CrCl cannot be calculated (Unknown ideal weight.). Liver Function Tests: Recent Labs  Lab 08/19/19 1847  AST 12*  ALT 10  ALKPHOS 58  BILITOT 0.4  PROT 6.7  ALBUMIN 3.3*   No results for input(s): LIPASE, AMYLASE in the last 168 hours. No results for input(s): AMMONIA in the last 168 hours.  Coagulation Profile: Recent Labs  Lab 08/19/19 1847  INR 1.0   Cardiac Enzymes: No results for input(s): CKTOTAL, CKMB, CKMBINDEX, TROPONINI in the last 168 hours. BNP (last 3 results) No results for input(s): PROBNP in the last 8760 hours. HbA1C: No results for input(s): HGBA1C in the last 72 hours. CBG: Recent Labs  Lab 08/19/19 1844 08/19/19 2213  GLUCAP 96 98   Lipid Profile: No results for input(s): CHOL, HDL, LDLCALC, TRIG, CHOLHDL, LDLDIRECT in the last 72 hours. Thyroid Function Tests: No results for input(s): TSH, T4TOTAL, FREET4, T3FREE, THYROIDAB in the last 72 hours. Anemia Panel: No results for input(s): VITAMINB12, FOLATE, FERRITIN, TIBC, IRON, RETICCTPCT in the last 72 hours. Urine analysis: No results found for: COLORURINE, APPEARANCEUR, LABSPEC, PHURINE, GLUCOSEU, HGBUR, BILIRUBINUR, KETONESUR, PROTEINUR, UROBILINOGEN, NITRITE, LEUKOCYTESUR Sepsis Labs: @LABRCNTIP (procalcitonin:4,lacticidven:4) )No results found for this or any previous visit (from the past 240 hour(s)).   Radiological Exams on Admission: Ct Angio Head W Or Wo Contrast  Result Date: 08/19/2019 CLINICAL DATA:  Left hand numbness EXAM: CT ANGIOGRAPHY HEAD AND NECK TECHNIQUE: Multidetector CT imaging of the head and neck was performed using the standard protocol during bolus administration of intravenous  contrast. Multiplanar CT image reconstructions and MIPs were obtained to evaluate the vascular anatomy. Carotid stenosis measurements (when applicable) are obtained utilizing NASCET criteria, using the distal internal carotid diameter as the denominator. CONTRAST:  8mL OMNIPAQUE IOHEXOL 350 MG/ML SOLN COMPARISON:  Head CT same day FINDINGS: CTA NECK FINDINGS SKELETON: There is no bony spinal canal stenosis. No lytic or blastic lesion. OTHER NECK: Normal pharynx, larynx and major salivary glands. No cervical lymphadenopathy. Unremarkable thyroid gland. UPPER CHEST: No pneumothorax or pleural effusion. No nodules or masses. AORTIC ARCH: There is no calcific atherosclerosis of the aortic arch. There is no aneurysm, dissection or hemodynamically significant stenosis of the visualized portion of the aorta. Conventional 3 vessel aortic branching pattern. The visualized proximal subclavian arteries are widely patent. RIGHT CAROTID SYSTEM: Normal without aneurysm, dissection or stenosis. LEFT CAROTID SYSTEM: Normal without aneurysm, dissection or stenosis. VERTEBRAL ARTERIES: Left dominant configuration. Both origins are clearly patent. There is no dissection, occlusion or flow-limiting stenosis to the skull base (V1-V3 segments). CTA HEAD FINDINGS POSTERIOR CIRCULATION: --Vertebral arteries: Normal V4 segments. --Posterior inferior cerebellar arteries (PICA): Patent origins from the vertebral arteries. --Anterior inferior cerebellar arteries (AICA): Patent origins from the basilar artery. --Basilar artery: Normal. --Superior cerebellar arteries: Normal. --Posterior cerebral arteries: Normal. The right PCA is predominantly supplied by the posterior communicating artery. ANTERIOR CIRCULATION: --Intracranial internal carotid arteries: Normal. --Anterior cerebral arteries (ACA): Normal. Both A1 segments are present. Patent anterior communicating artery (a-comm). --Middle cerebral arteries (MCA): Normal. VENOUS SINUSES: As  permitted by contrast timing, patent. ANATOMIC VARIANTS: None Review of the MIP images confirms the above findings. IMPRESSION: Normal CTA of the head and neck. Electronically Signed   By: Ulyses Jarred M.D.   On: 08/19/2019 19:21   Ct Angio Neck W Or Wo Contrast  Result Date: 08/19/2019 CLINICAL DATA:  Left hand numbness EXAM: CT ANGIOGRAPHY HEAD AND NECK TECHNIQUE: Multidetector CT imaging of the head and neck was performed using the standard protocol during bolus administration of intravenous contrast. Multiplanar CT image reconstructions and MIPs were obtained to evaluate the vascular anatomy. Carotid stenosis measurements (when applicable) are obtained utilizing NASCET criteria, using the distal internal carotid diameter as the denominator. CONTRAST:  36mL OMNIPAQUE IOHEXOL 350 MG/ML SOLN COMPARISON:  Head CT same day FINDINGS: CTA NECK FINDINGS SKELETON: There is  no bony spinal canal stenosis. No lytic or blastic lesion. OTHER NECK: Normal pharynx, larynx and major salivary glands. No cervical lymphadenopathy. Unremarkable thyroid gland. UPPER CHEST: No pneumothorax or pleural effusion. No nodules or masses. AORTIC ARCH: There is no calcific atherosclerosis of the aortic arch. There is no aneurysm, dissection or hemodynamically significant stenosis of the visualized portion of the aorta. Conventional 3 vessel aortic branching pattern. The visualized proximal subclavian arteries are widely patent. RIGHT CAROTID SYSTEM: Normal without aneurysm, dissection or stenosis. LEFT CAROTID SYSTEM: Normal without aneurysm, dissection or stenosis. VERTEBRAL ARTERIES: Left dominant configuration. Both origins are clearly patent. There is no dissection, occlusion or flow-limiting stenosis to the skull base (V1-V3 segments). CTA HEAD FINDINGS POSTERIOR CIRCULATION: --Vertebral arteries: Normal V4 segments. --Posterior inferior cerebellar arteries (PICA): Patent origins from the vertebral arteries. --Anterior inferior  cerebellar arteries (AICA): Patent origins from the basilar artery. --Basilar artery: Normal. --Superior cerebellar arteries: Normal. --Posterior cerebral arteries: Normal. The right PCA is predominantly supplied by the posterior communicating artery. ANTERIOR CIRCULATION: --Intracranial internal carotid arteries: Normal. --Anterior cerebral arteries (ACA): Normal. Both A1 segments are present. Patent anterior communicating artery (a-comm). --Middle cerebral arteries (MCA): Normal. VENOUS SINUSES: As permitted by contrast timing, patent. ANATOMIC VARIANTS: None Review of the MIP images confirms the above findings. IMPRESSION: Normal CTA of the head and neck. Electronically Signed   By: Ulyses Jarred M.D.   On: 08/19/2019 19:21   Ct Head Code Stroke Wo Contrast  Result Date: 08/19/2019 CLINICAL DATA:  Code stroke.  Left hand numbness and dizziness EXAM: CT HEAD WITHOUT CONTRAST TECHNIQUE: Contiguous axial images were obtained from the base of the skull through the vertex without intravenous contrast. COMPARISON:  Head CT 06/27/2017 FINDINGS: Brain: There is no mass, hemorrhage or extra-axial collection. There has been progression of left occipital lobe encephalomalacia. No evidence of acute cortical infarct. Vascular: No abnormal hyperdensity of the major intracranial arteries or dural venous sinuses. No intracranial atherosclerosis. Skull: The visualized skull base, calvarium and extracranial soft tissues are normal. Sinuses/Orbits: No fluid levels or advanced mucosal thickening of the visualized paranasal sinuses. No mastoid or middle ear effusion. The orbits are normal. ASPECTS [facility] Stroke Program Early CT Score) - Ganglionic level infarction (caudate, lentiform nuclei, internal capsule, insula, M1-M3 cortex): 7 - Supraganglionic infarction (M4-M6 cortex): 3 Total score (0-10 with 10 being normal): 10 IMPRESSION: 1. No acute intracranial abnormality. 2. Progression of left occipital lobe encephalomalacia.  3. ASPECTS is 10. * These results were communicated to Dr. Roland Rack at 7:07 pm on 08/19/2019 by text page via the [facility] messaging system. Electronically Signed   By: Ulyses Jarred M.D.   On: 08/19/2019 19:07    EKG: Independently reviewed.  It shows sinus tachycardia with a rate of 110, right axis deviation, evidence of LVH by voltage criteria, no significant ST changes  Assessment/Plan Principal Problem:   TIA (transient ischemic attack) Active Problems:   Essential hypertension   Hyperglycemia   Morbid obesity ([place])     #1 TIA: Patient will be admitted for evaluation.  Check MRI of the brain, carotid Dopplers and echocardiogram.  Aspirin and statin.  #2 essential hypertension: Continue home regimen and control blood pressure  #3 hyperglycemia: Check hemoglobin A1c and continued glucose control  #4 morbid obesity: Dietary counseling  #5 history of CVA: Continue aspirin and statin  #6 depression with anxiety: Continue home regimen   DVT prophylaxis: Lovenox Code Status: Full code Family Communication: No family at bedside Disposition Plan: Home Consults  called: Dr. Lorraine Lax, neurology Admission status: Observation  Severity of Illness: The appropriate patient status for this patient is OBSERVATION. Observation status is judged to be reasonable and necessary in order to provide the required intensity of service to ensure the patient's safety. The patient's presenting symptoms, physical exam findings, and initial radiographic and laboratory data in the context of their medical condition is felt to place them at decreased risk for further clinical deterioration. Furthermore, it is anticipated that the patient will be medically stable for discharge from the hospital within 2 midnights of admission. The following factors support the patient status of observation.   " The patient's presenting symptoms include left-sided numbness. " The physical exam findings include no  significant neurologic findings. " The initial radiographic and laboratory data are normal head CT.     Barbette Merino MD [facility] Pager [phone]  If 7PM-7AM, please contact night-coverage [email or web address] Password [email or web address]  08/19/2019, 11:13 PM

## 2019-08-20 ENCOUNTER — Observation Stay (HOSPITAL_COMMUNITY): Payer: Medicaid Other

## 2019-08-20 ENCOUNTER — Observation Stay (HOSPITAL_BASED_OUTPATIENT_CLINIC_OR_DEPARTMENT_OTHER): Payer: Medicaid Other

## 2019-08-20 DIAGNOSIS — G459 Transient cerebral ischemic attack, unspecified: Secondary | ICD-10-CM | POA: Diagnosis not present

## 2019-08-20 DIAGNOSIS — R42 Dizziness and giddiness: Secondary | ICD-10-CM | POA: Diagnosis not present

## 2019-08-20 DIAGNOSIS — I361 Nonrheumatic tricuspid (valve) insufficiency: Secondary | ICD-10-CM

## 2019-08-20 LAB — ECHOCARDIOGRAM COMPLETE
Height: 64 in
Weight: 3615.54 oz

## 2019-08-20 LAB — CBC WITH DIFFERENTIAL/PLATELET
Abs Immature Granulocytes: 0 10*3/uL (ref 0.00–0.07)
Basophils Absolute: 0.2 10*3/uL — ABNORMAL HIGH (ref 0.0–0.1)
Basophils Relative: 2 %
Eosinophils Absolute: 0.1 10*3/uL (ref 0.0–0.5)
Eosinophils Relative: 1 %
HCT: 27.8 % — ABNORMAL LOW (ref 36.0–46.0)
Hemoglobin: 7.9 g/dL — ABNORMAL LOW (ref 12.0–15.0)
Lymphocytes Relative: 29 %
Lymphs Abs: 2.2 10*3/uL (ref 0.7–4.0)
MCH: 18.9 pg — ABNORMAL LOW (ref 26.0–34.0)
MCHC: 28.4 g/dL — ABNORMAL LOW (ref 30.0–36.0)
MCV: 66.5 fL — ABNORMAL LOW (ref 80.0–100.0)
Monocytes Absolute: 0.2 10*3/uL (ref 0.1–1.0)
Monocytes Relative: 3 %
Neutro Abs: 5 10*3/uL (ref 1.7–7.7)
Neutrophils Relative %: 65 %
Platelets: 377 10*3/uL (ref 150–400)
RBC: 4.18 MIL/uL (ref 3.87–5.11)
RDW: 19.9 % — ABNORMAL HIGH (ref 11.5–15.5)
WBC: 7.7 10*3/uL (ref 4.0–10.5)
nRBC: 0 % (ref 0.0–0.2)
nRBC: 0 /100 WBC

## 2019-08-20 LAB — HEMOGLOBIN A1C
Hgb A1c MFr Bld: 5.4 % (ref 4.8–5.6)
Mean Plasma Glucose: 108.28 mg/dL

## 2019-08-20 LAB — LIPID PANEL
Cholesterol: 147 mg/dL (ref 0–200)
HDL: 55 mg/dL (ref >40)
LDL Cholesterol: 87 mg/dL (ref 0–99)
Total CHOL/HDL Ratio: 2.7 RATIO
Triglycerides: 24 mg/dL (ref <150)
VLDL: 5 mg/dL (ref 0–40)

## 2019-08-20 LAB — BASIC METABOLIC PANEL
Anion gap: 10 (ref 5–15)
BUN: 12 mg/dL (ref 6–20)
CO2: 20 mmol/L — ABNORMAL LOW (ref 22–32)
Calcium: 9.2 mg/dL (ref 8.9–10.3)
Chloride: 106 mmol/L (ref 98–111)
Creatinine, Ser: 0.82 mg/dL (ref 0.44–1.00)
GFR calc Af Amer: >60 mL/min (ref >60)
GFR calc non Af Amer: >60 mL/min (ref >60)
Glucose, Bld: 77 mg/dL (ref 70–99)
Potassium: 4.2 mmol/L (ref 3.5–5.1)
Sodium: 136 mmol/L (ref 135–145)

## 2019-08-20 LAB — HIV ANTIBODY (ROUTINE TESTING W REFLEX): HIV Screen 4th Generation wRfx: NONREACTIVE

## 2019-08-20 MED ORDER — ASPIRIN EC 81 MG PO TBEC
81.0000 mg | DELAYED_RELEASE_TABLET | Freq: Every day | ORAL | Status: DC
  Administered 2019-08-21: 81 mg via ORAL
  Filled 2019-08-20: qty 1

## 2019-08-20 MED ORDER — CLOPIDOGREL BISULFATE 75 MG PO TABS
75.0000 mg | ORAL_TABLET | Freq: Every day | ORAL | Status: DC
  Administered 2019-08-21: 75 mg via ORAL
  Filled 2019-08-20: qty 1

## 2019-08-20 MED ORDER — ENOXAPARIN SODIUM 40 MG/0.4ML ~~LOC~~ SOLN
40.0000 mg | SUBCUTANEOUS | Status: DC
  Administered 2019-08-20: 40 mg via SUBCUTANEOUS
  Filled 2019-08-20: qty 0.4

## 2019-08-20 NOTE — Progress Notes (Signed)
PROGRESS NOTE    Sarah Bean  [id number] DOB: 1965/11/05 DOA: 08/19/2019 PCP: [facility]   Brief Narrative:  Sarah Bean is a 53 y.o. female with medical history significant of hypertension, depression, previous CVA, asthma and morbid obesity who presented with dizziness and left hand numbness that started around 530 p.m. the evening of admission.  She was brought in as a code stroke.  Evaluated and found to be having a low stroke score.  Not a candidate of TPA.  Patient however has risk factors for CVA.  She complained of dizziness worse with movement especially standing up.  Denied any focal weakness.  Patient also has some mild headache.  The tingling and numbness in the left hand has improved somewhat while in the ER. Initial head CT showed no acute findings.  She was hemodynamically stable.  COVID-19 negative.  Admitted to hospital service and neurology consulted.    Assessment & Plan:   Principal Problem:   TIA (transient ischemic attack) Active Problems:   Essential hypertension   Hyperglycemia   Morbid obesity (HCC)  Dizziness/left hand numbness tingling/?TIA: CT head and CTA of the head and neck all within normal limits.  MRI recommended however patient has refused due to being claustrophobic.  MRI canceled by neurology and they are pursuing repeat CT head in the morning since patient continues to have tingling in the left hand.  Echo shows no source of embolus and ejection fraction 65 to 70%.  Hemoglobin A1c 5.4 LDL 87.  Antiplatelet agents per neurology.  PT OT and SLP on board.  Essential hypertension: Controlled.  Neurology recommends allowing permissive hypertension.  Will discontinue antihypertensives.  Hypoglycemia: Blood sugar better.  No diabetes.  Morbid obesity: Counseling provided for weight loss.  Hyperlipidemia: Continue statin.  Depression with anxiety: Continue home regimen.  DVT prophylaxis: Lovenox Code Status: Full  code Family Communication:  None present at bedside.  Plan of care discussed with patient in length and he verbalized understanding and agreed with it. Disposition Plan: Potential discharge tomorrow  Estimated body mass index is 38.79 kg/m as calculated from the following:   Height as of this encounter: 5\' 4"  (1.626 m).   Weight as of this encounter: 102.5 kg.      Nutritional status:               Consultants:   Neurology  Procedures:   None  Antimicrobials:   None   Subjective: Patient seen and examined.  She continues to have left hand tingling but numbness have improved.  No other focal deficit or any other complaint.  Objective: Vitals:   08/20/19 0515 08/20/19 0800 08/20/19 0900 08/20/19 1000  BP: 112/69 113/75 (!) 103/91 113/78  Pulse: 66 70 68 76  Resp: 16 16  18   Temp: 98.5 F (36.9 C) 98.6 F (37 C)    TempSrc: Oral Oral    SpO2: 98% 100%    Weight:      Height:        Intake/Output Summary (Last 24 hours) at 08/20/2019 1539 Last data filed at 08/20/2019 0500 Gross per 24 hour  Intake 563.84 ml  Output --  Net 563.84 ml   Filed Weights   08/19/19 2308  Weight: 102.5 kg    Examination:  General exam: Appears calm and comfortable  Respiratory system: Clear to auscultation. Respiratory effort normal. Cardiovascular system: S1 & S2 heard, RRR. No JVD, murmurs, rubs, gallops or clicks. No pedal edema. Gastrointestinal  system: Abdomen is nondistended, soft and nontender. No organomegaly or masses felt. Normal bowel sounds heard. Central nervous system: Alert and oriented. No focal neurological deficits. Extremities: Symmetric 5 x 5 power. Skin: No rashes, lesions or ulcers Psychiatry: Judgement and insight appear normal. Mood & affect appropriate.    Data Reviewed: I have personally reviewed following labs and imaging studies  CBC: Recent Labs  Lab 08/19/19 1847 08/19/19 1854 08/20/19 0814  WBC 8.7  --  7.7  NEUTROABS 6.4  --   5.0  HGB 8.3* 10.2* 7.9*  HCT 29.0* 30.0* 27.8*  MCV 66.7*  --  66.5*  PLT 406*  --  [id number]   Basic Metabolic Panel: Recent Labs  Lab 08/19/19 1847 08/19/19 1854 08/20/19 0814  NA 137 139 136  K 3.8 3.9 4.2  CL 105 104 106  CO2 22  --  20*  GLUCOSE 106* 101* 77  BUN 12 13 12   CREATININE 0.80 0.80 0.82  CALCIUM 9.2  --  9.2   GFR: Estimated Creatinine Clearance: 93.5 mL/min (by C-G formula based on SCr of 0.82 mg/dL). Liver Function Tests: Recent Labs  Lab 08/19/19 1847  AST 12*  ALT 10  ALKPHOS 58  BILITOT 0.4  PROT 6.7  ALBUMIN 3.3*   No results for input(s): LIPASE, AMYLASE in the last 168 hours. No results for input(s): AMMONIA in the last 168 hours. Coagulation Profile: Recent Labs  Lab 08/19/19 1847  INR 1.0   Cardiac Enzymes: No results for input(s): CKTOTAL, CKMB, CKMBINDEX, TROPONINI in the last 168 hours. BNP (last 3 results) No results for input(s): PROBNP in the last 8760 hours. HbA1C: Recent Labs    08/20/19 0337  HGBA1C 5.4   CBG: Recent Labs  Lab 08/19/19 1844 08/19/19 2213  GLUCAP 96 98   Lipid Profile: Recent Labs    08/20/19 0337  CHOL 147  HDL 55  LDLCALC 87  TRIG 24  CHOLHDL 2.7   Thyroid Function Tests: No results for input(s): TSH, T4TOTAL, FREET4, T3FREE, THYROIDAB in the last 72 hours. Anemia Panel: No results for input(s): VITAMINB12, FOLATE, FERRITIN, TIBC, IRON, RETICCTPCT in the last 72 hours. Sepsis Labs: No results for input(s): PROCALCITON, LATICACIDVEN in the last 168 hours.  Recent Results (from the past 240 hour(s))  SARS CORONAVIRUS 2 (TAT 6-24 HRS) Nasopharyngeal Nasopharyngeal Swab     Status: None   Collection Time: 08/19/19  7:41 PM   Specimen: Nasopharyngeal Swab  Result Value Ref Range Status   SARS Coronavirus 2 NEGATIVE NEGATIVE Final    Comment: (NOTE) SARS-CoV-2 target nucleic acids are NOT DETECTED. The SARS-CoV-2 RNA is generally detectable in upper and lower respiratory specimens during  the acute phase of infection. Negative results do not preclude SARS-CoV-2 infection, do not rule out co-infections with other pathogens, and should not be used as the sole basis for treatment or other patient management decisions. Negative results must be combined with clinical observations, patient history, and epidemiological information. The expected result is Negative. Fact Sheet for Patients: SugarRoll.be Fact Sheet for Healthcare Providers: https://www.woods-mathews.com/ This test is not yet approved or cleared by the [place] FDA and  has been authorized for detection and/or diagnosis of SARS-CoV-2 by FDA under an Emergency Use Authorization (EUA). This EUA will remain  in effect (meaning this test can be used) for the duration of the COVID-19 declaration under Section 56 4(b)(1) of the Act, 21 U.S.C. section 360bbb-3(b)(1), unless the authorization is terminated or revoked sooner. Performed at [facility], [address], [place], [place] [address]       Radiology Studies: Ct Angio Head W Or Wo Contrast  Result Date: 08/19/2019 CLINICAL DATA:  Left hand numbness EXAM: CT ANGIOGRAPHY HEAD AND NECK TECHNIQUE: Multidetector CT imaging of the head and neck was performed using the standard protocol during bolus administration of intravenous contrast. Multiplanar CT image reconstructions and MIPs were obtained to evaluate the vascular anatomy. Carotid stenosis measurements (when applicable) are obtained utilizing NASCET criteria, using the distal internal carotid diameter as the denominator. CONTRAST:  71mL OMNIPAQUE IOHEXOL 350 MG/ML SOLN COMPARISON:  Head CT same day FINDINGS: CTA NECK FINDINGS SKELETON: There is no bony spinal canal stenosis. No lytic or blastic lesion. OTHER NECK: Normal pharynx, larynx and major salivary glands. No cervical lymphadenopathy. Unremarkable thyroid gland. UPPER CHEST: No pneumothorax or pleural  effusion. No nodules or masses. AORTIC ARCH: There is no calcific atherosclerosis of the aortic arch. There is no aneurysm, dissection or hemodynamically significant stenosis of the visualized portion of the aorta. Conventional 3 vessel aortic branching pattern. The visualized proximal subclavian arteries are widely patent. RIGHT CAROTID SYSTEM: Normal without aneurysm, dissection or stenosis. LEFT CAROTID SYSTEM: Normal without aneurysm, dissection or stenosis. VERTEBRAL ARTERIES: Left dominant configuration. Both origins are clearly patent. There is no dissection, occlusion or flow-limiting stenosis to the skull base (V1-V3 segments). CTA HEAD FINDINGS POSTERIOR CIRCULATION: --Vertebral arteries: Normal V4 segments. --Posterior inferior cerebellar arteries (PICA): Patent origins from the vertebral arteries. --Anterior inferior cerebellar arteries (AICA): Patent origins from the basilar artery. --Basilar artery: Normal. --Superior cerebellar arteries: Normal. --Posterior cerebral arteries: Normal. The right PCA is predominantly supplied by the posterior communicating artery. ANTERIOR CIRCULATION: --Intracranial internal carotid arteries: Normal. --Anterior cerebral arteries (ACA): Normal. Both A1 segments are present. Patent anterior communicating artery (a-comm). --Middle cerebral arteries (MCA): Normal. VENOUS SINUSES: As permitted by contrast timing, patent. ANATOMIC VARIANTS: None Review of the MIP images confirms the above findings. IMPRESSION: Normal CTA of the head and neck. Electronically Signed   By: Ulyses Jarred M.D.   On: 08/19/2019 19:21   Ct Angio Neck W Or Wo Contrast  Result Date: 08/19/2019 CLINICAL DATA:  Left hand numbness EXAM: CT ANGIOGRAPHY HEAD AND NECK TECHNIQUE: Multidetector CT imaging of the head and neck was performed using the standard protocol during bolus administration of intravenous contrast. Multiplanar CT image reconstructions and MIPs were obtained to evaluate the vascular  anatomy. Carotid stenosis measurements (when applicable) are obtained utilizing NASCET criteria, using the distal internal carotid diameter as the denominator. CONTRAST:  10mL OMNIPAQUE IOHEXOL 350 MG/ML SOLN COMPARISON:  Head CT same day FINDINGS: CTA NECK FINDINGS SKELETON: There is no bony spinal canal stenosis. No lytic or blastic lesion. OTHER NECK: Normal pharynx, larynx and major salivary glands. No cervical lymphadenopathy. Unremarkable thyroid gland. UPPER CHEST: No pneumothorax or pleural effusion. No nodules or masses. AORTIC ARCH: There is no calcific atherosclerosis of the aortic arch. There is no aneurysm, dissection or hemodynamically significant stenosis of the visualized portion of the aorta. Conventional 3 vessel aortic branching pattern. The visualized proximal subclavian arteries are widely patent. RIGHT CAROTID SYSTEM: Normal without aneurysm, dissection or stenosis. LEFT CAROTID SYSTEM: Normal without aneurysm, dissection or stenosis. VERTEBRAL ARTERIES: Left dominant configuration. Both origins are clearly patent. There is no dissection, occlusion or flow-limiting stenosis to the skull base (V1-V3 segments). CTA HEAD FINDINGS POSTERIOR CIRCULATION: --Vertebral arteries: Normal V4 segments. --Posterior inferior cerebellar arteries (PICA): Patent origins from the vertebral arteries. --Anterior inferior cerebellar arteries (  AICA): Patent origins from the basilar artery. --Basilar artery: Normal. --Superior cerebellar arteries: Normal. --Posterior cerebral arteries: Normal. The right PCA is predominantly supplied by the posterior communicating artery. ANTERIOR CIRCULATION: --Intracranial internal carotid arteries: Normal. --Anterior cerebral arteries (ACA): Normal. Both A1 segments are present. Patent anterior communicating artery (a-comm). --Middle cerebral arteries (MCA): Normal. VENOUS SINUSES: As permitted by contrast timing, patent. ANATOMIC VARIANTS: None Review of the MIP images confirms  the above findings. IMPRESSION: Normal CTA of the head and neck. Electronically Signed   By: Ulyses Jarred M.D.   On: 08/19/2019 19:21   Ct Head Code Stroke Wo Contrast  Result Date: 08/19/2019 CLINICAL DATA:  Code stroke.  Left hand numbness and dizziness EXAM: CT HEAD WITHOUT CONTRAST TECHNIQUE: Contiguous axial images were obtained from the base of the skull through the vertex without intravenous contrast. COMPARISON:  Head CT 06/27/2017 FINDINGS: Brain: There is no mass, hemorrhage or extra-axial collection. There has been progression of left occipital lobe encephalomalacia. No evidence of acute cortical infarct. Vascular: No abnormal hyperdensity of the major intracranial arteries or dural venous sinuses. No intracranial atherosclerosis. Skull: The visualized skull base, calvarium and extracranial soft tissues are normal. Sinuses/Orbits: No fluid levels or advanced mucosal thickening of the visualized paranasal sinuses. No mastoid or middle ear effusion. The orbits are normal. ASPECTS [facility] Stroke Program Early CT Score) - Ganglionic level infarction (caudate, lentiform nuclei, internal capsule, insula, M1-M3 cortex): 7 - Supraganglionic infarction (M4-M6 cortex): 3 Total score (0-10 with 10 being normal): 10 IMPRESSION: 1. No acute intracranial abnormality. 2. Progression of left occipital lobe encephalomalacia. 3. ASPECTS is 10. * These results were communicated to Dr. Roland Rack at 7:07 pm on 08/19/2019 by text page via the [facility] messaging system. Electronically Signed   By: Ulyses Jarred M.D.   On: 08/19/2019 19:07    Scheduled Meds:  aspirin  300 mg Rectal Daily   Or   aspirin  325 mg Oral Daily   atorvastatin  40 mg Oral q1800   ferrous sulfate  325 mg Oral Daily   lisinopril  20 mg Oral Daily   And   hydrochlorothiazide  25 mg Oral Daily   QUEtiapine  100 mg Oral QHS   rOPINIRole  0.25 mg Oral QPM   traZODone  100 mg Oral QHS   Continuous Infusions:  sodium  chloride 100 mL/hr at 08/20/19 0726     LOS: 0 days   Time spent: 32 minutes   Darliss Cheney, MD [facility]  08/20/2019, 3:39 PM   To contact the attending provider between 7A-7P or the covering provider during after hours 7P-7A, please log into the web site [email or web address] and use password [email or web address].

## 2019-08-20 NOTE — Evaluation (Signed)
Occupational Therapy Evaluation Patient Details Name: Sarah Bean MRN: [id number] DOB: 1966-02-13 Today's Date: 08/20/2019    History of Present Illness Pt is a 53 yo female s/p Dizziness and L hand numbness. CT head negative. MRI pending. PMHx: HTN, depression, h/o CVA, obesity   Clinical Impression   Pt PTA: Pt living alone independently. Pt currently, limited by continuance of dizzy spells and L hand tingling, but no functional deficit noted. SupervisionA level for ADL and mobility with close minguard due to "dizzy spells." Pt ambulating supervisionA level in room, no AD. Sensation intact. Pt performing ADL tasks in sitting and standing with supervisionA to minguardA. Pt would benefit from continued OT skilled services. OT following acutely. BP became dizzy after ambulating 120' and stair training up/down 2 steps. Pt BP in sitting: 103/97 68 BPM; standing 124/99 89 BPM; after 3 mins exertion in sitting: 106/82 HR 76 BPM.     Follow Up Recommendations  Home health OT;Supervision/Assistance - 24 hour(initially)  May progress to go home with only 24/7 and no therapy required.   Equipment Recommendations  None recommended by OT    Recommendations for Other Services       Precautions / Restrictions Precautions Precautions: Fall;Other (comment) Precaution Comments: lightheadedness Restrictions Weight Bearing Restrictions: No      Mobility Bed Mobility Overal bed mobility: Modified Independent                Transfers Overall transfer level: Needs assistance Equipment used: None Transfers: Sit to/from Stand Sit to Stand: Min guard         General transfer comment: unsteadiness with initial standing balance    Balance Overall balance assessment: Needs assistance Sitting-balance support: No upper extremity supported;Feet supported Sitting balance-Leahy Scale: Fair     Standing balance support: No upper extremity supported;During functional activity Standing  balance-Leahy Scale: Fair Standing balance comment: pt with mini episodes of unsteadiness requiring minguardA for stability.                           ADL either performed or assessed with clinical judgement   ADL Overall ADL's : At baseline                                       General ADL Comments: SupervisionA level for ADL and mobility with close minguard due to "dizzy spells"     Vision Baseline Vision/History: No visual deficits Vision Assessment?: No apparent visual deficits     Perception     Praxis      Pertinent Vitals/Pain Pain Assessment: No/denies pain     Hand Dominance Right   Extremity/Trunk Assessment Upper Extremity Assessment Upper Extremity Assessment: LUE deficits/detail LUE Deficits / Details: tingling in L hand, otherwise, WFLs   Lower Extremity Assessment Lower Extremity Assessment: Generalized weakness   Cervical / Trunk Assessment Cervical / Trunk Assessment: Normal   Communication Communication Communication: No difficulties   Cognition Arousal/Alertness: Awake/alert Behavior During Therapy: WFL for tasks assessed/performed Overall Cognitive Status: Within Functional Limits for tasks assessed                                     General Comments  BP became dizzy after ambulating 120' and stair training up/down 2 steps. Pt BP in sitting: 103/97 68 BPM;  standing 124/99 89 BPM; after 3 mins exertion in sitting: 106/82 HR 76 BPM.     Exercises     Shoulder Instructions      Home Living Family/patient expects to be discharged to:: Private residence Living Arrangements: Alone Available Help at Discharge: Family;Available PRN/intermittently Type of Home: House Home Access: Stairs to enter [employer] of Steps: 2 Entrance Stairs-Rails: Right Home Layout: One level     Bathroom Shower/Tub: Walk-in shower;Tub/shower unit   Bathroom Toilet: Standard     Home Equipment: None    Additional Comments: Pt plans to stay with daughter at her home: 2L, but she is able to live on ML- level entry.      Prior Functioning/Environment Level of Independence: Independent                 OT Problem List: Decreased activity tolerance;Decreased safety awareness      OT Treatment/Interventions: Self-care/ADL training;Therapeutic exercise;Neuromuscular education;Energy conservation;Therapeutic activities;Patient/family education;Balance training    OT Goals(Current goals can be found in the care plan section) Acute Rehab OT Goals Patient Stated Goal: to go home OT Goal Formulation: With patient Time For Goal Achievement: 09/03/19 Potential to Achieve Goals: Good ADL Goals Pt Will Perform Lower Body Dressing: with min assist;sit to/from stand Additional ADL Goal #1: Pt will increase to modified independence for OOB ADL with fair dynamic standing balance  OT Frequency: Min 2X/week   Barriers to D/C:            Co-evaluation              AM-PAC OT "6 Clicks" Daily Activity     Outcome Measure Help from another person eating meals?: None Help from another person taking care of personal grooming?: A Little Help from another person toileting, which includes using toliet, bedpan, or urinal?: None Help from another person bathing (including washing, rinsing, drying)?: A Little Help from another person to put on and taking off regular upper body clothing?: None Help from another person to put on and taking off regular lower body clothing?: A Little 6 Click Score: 21   End of Session Equipment Utilized During Treatment: Gait belt Nurse Communication: Mobility status  Activity Tolerance: Patient limited by fatigue Patient left: in bed;with call bell/phone within reach;with bed alarm set  OT Visit Diagnosis: Unsteadiness on feet (R26.81);Muscle weakness (generalized) (M62.81)                Time: [id number] OT Time Calculation (min): 40 min Charges:  OT  General Charges $OT Visit: 1 Visit OT Evaluation $OT Eval Moderate Complexity: 1 Mod OT Treatments $Self Care/Home Management : 23-37 mins  Ebony Hail Harold Hedge) Marsa Aris OTR/L Acute Rehabilitation Services Pager: [phone] Office: [place] 08/20/2019, 10:12 AM

## 2019-08-20 NOTE — Progress Notes (Signed)
STROKE TEAM PROGRESS NOTE   INTERVAL HISTORY Patient reports 2 episodes of dizziness over the past 24h. She has hx of old stroke and was upset to learn that the scar can still be seen on imaging. She is worried she is having another stroke.  I have personally reviewed history of presenting illness with the patient, electronic medical records and imaging films in PACS  Vitals:   08/20/19 0515 08/20/19 0800 08/20/19 0900 08/20/19 1000  BP: 112/69 113/75 (!) 103/91 113/78  Pulse: 66 70 68 76  Resp: 16 16  18   Temp: 98.5 F (36.9 C) 98.6 F (37 C)    TempSrc: Oral Oral    SpO2: 98% 100%    Weight:      Height:        CBC:  Recent Labs  Lab 08/19/19 1847 08/19/19 1854 08/20/19 0814  WBC 8.7  --  7.7  NEUTROABS 6.4  --  5.0  HGB 8.3* 10.2* 7.9*  HCT 29.0* 30.0* 27.8*  MCV 66.7*  --  66.5*  PLT 406*  --  [id number]    Basic Metabolic Panel:  Recent Labs  Lab 08/19/19 1847 08/19/19 1854 08/20/19 0814  NA 137 139 136  K 3.8 3.9 4.2  CL 105 104 106  CO2 22  --  20*  GLUCOSE 106* 101* 77  BUN 12 13 12   CREATININE 0.80 0.80 0.82  CALCIUM 9.2  --  9.2   Lipid Panel:     Component Value Date/Time   CHOL 147 08/20/2019 0337   TRIG 24 08/20/2019 0337   HDL 55 08/20/2019 0337   CHOLHDL 2.7 08/20/2019 0337   VLDL 5 08/20/2019 0337   LDLCALC 87 08/20/2019 0337   HgbA1c:  Lab Results  Component Value Date   HGBA1C 5.4 08/20/2019   Urine Drug Screen:     Component Value Date/Time   LABOPIA NONE DETECTED 05/25/2017 2304   COCAINSCRNUR NONE DETECTED 05/25/2017 2304   LABBENZ POSITIVE (A) 05/25/2017 2304   AMPHETMU NONE DETECTED 05/25/2017 2304   THCU NONE DETECTED 05/25/2017 2304   LABBARB NONE DETECTED 05/25/2017 2304    Alcohol Level No results found for: ETH  IMAGING Ct Angio Head W Or Wo Contrast  Result Date: 08/19/2019 CLINICAL DATA:  Left hand numbness EXAM: CT ANGIOGRAPHY HEAD AND NECK TECHNIQUE: Multidetector CT imaging of the head and neck was performed  using the standard protocol during bolus administration of intravenous contrast. Multiplanar CT image reconstructions and MIPs were obtained to evaluate the vascular anatomy. Carotid stenosis measurements (when applicable) are obtained utilizing NASCET criteria, using the distal internal carotid diameter as the denominator. CONTRAST:  41mL OMNIPAQUE IOHEXOL 350 MG/ML SOLN COMPARISON:  Head CT same day FINDINGS: CTA NECK FINDINGS SKELETON: There is no bony spinal canal stenosis. No lytic or blastic lesion. OTHER NECK: Normal pharynx, larynx and major salivary glands. No cervical lymphadenopathy. Unremarkable thyroid gland. UPPER CHEST: No pneumothorax or pleural effusion. No nodules or masses. AORTIC ARCH: There is no calcific atherosclerosis of the aortic arch. There is no aneurysm, dissection or hemodynamically significant stenosis of the visualized portion of the aorta. Conventional 3 vessel aortic branching pattern. The visualized proximal subclavian arteries are widely patent. RIGHT CAROTID SYSTEM: Normal without aneurysm, dissection or stenosis. LEFT CAROTID SYSTEM: Normal without aneurysm, dissection or stenosis. VERTEBRAL ARTERIES: Left dominant configuration. Both origins are clearly patent. There is no dissection, occlusion or flow-limiting stenosis to the skull base (V1-V3 segments). CTA HEAD FINDINGS POSTERIOR CIRCULATION: --Vertebral arteries: Normal  V4 segments. --Posterior inferior cerebellar arteries (PICA): Patent origins from the vertebral arteries. --Anterior inferior cerebellar arteries (AICA): Patent origins from the basilar artery. --Basilar artery: Normal. --Superior cerebellar arteries: Normal. --Posterior cerebral arteries: Normal. The right PCA is predominantly supplied by the posterior communicating artery. ANTERIOR CIRCULATION: --Intracranial internal carotid arteries: Normal. --Anterior cerebral arteries (ACA): Normal. Both A1 segments are present. Patent anterior communicating artery  (a-comm). --Middle cerebral arteries (MCA): Normal. VENOUS SINUSES: As permitted by contrast timing, patent. ANATOMIC VARIANTS: None Review of the MIP images confirms the above findings. IMPRESSION: Normal CTA of the head and neck. Electronically Signed   By: Ulyses Jarred M.D.   On: 08/19/2019 19:21   Ct Angio Neck W Or Wo Contrast  Result Date: 08/19/2019 CLINICAL DATA:  Left hand numbness EXAM: CT ANGIOGRAPHY HEAD AND NECK TECHNIQUE: Multidetector CT imaging of the head and neck was performed using the standard protocol during bolus administration of intravenous contrast. Multiplanar CT image reconstructions and MIPs were obtained to evaluate the vascular anatomy. Carotid stenosis measurements (when applicable) are obtained utilizing NASCET criteria, using the distal internal carotid diameter as the denominator. CONTRAST:  79mL OMNIPAQUE IOHEXOL 350 MG/ML SOLN COMPARISON:  Head CT same day FINDINGS: CTA NECK FINDINGS SKELETON: There is no bony spinal canal stenosis. No lytic or blastic lesion. OTHER NECK: Normal pharynx, larynx and major salivary glands. No cervical lymphadenopathy. Unremarkable thyroid gland. UPPER CHEST: No pneumothorax or pleural effusion. No nodules or masses. AORTIC ARCH: There is no calcific atherosclerosis of the aortic arch. There is no aneurysm, dissection or hemodynamically significant stenosis of the visualized portion of the aorta. Conventional 3 vessel aortic branching pattern. The visualized proximal subclavian arteries are widely patent. RIGHT CAROTID SYSTEM: Normal without aneurysm, dissection or stenosis. LEFT CAROTID SYSTEM: Normal without aneurysm, dissection or stenosis. VERTEBRAL ARTERIES: Left dominant configuration. Both origins are clearly patent. There is no dissection, occlusion or flow-limiting stenosis to the skull base (V1-V3 segments). CTA HEAD FINDINGS POSTERIOR CIRCULATION: --Vertebral arteries: Normal V4 segments. --Posterior inferior cerebellar arteries  (PICA): Patent origins from the vertebral arteries. --Anterior inferior cerebellar arteries (AICA): Patent origins from the basilar artery. --Basilar artery: Normal. --Superior cerebellar arteries: Normal. --Posterior cerebral arteries: Normal. The right PCA is predominantly supplied by the posterior communicating artery. ANTERIOR CIRCULATION: --Intracranial internal carotid arteries: Normal. --Anterior cerebral arteries (ACA): Normal. Both A1 segments are present. Patent anterior communicating artery (a-comm). --Middle cerebral arteries (MCA): Normal. VENOUS SINUSES: As permitted by contrast timing, patent. ANATOMIC VARIANTS: None Review of the MIP images confirms the above findings. IMPRESSION: Normal CTA of the head and neck. Electronically Signed   By: Ulyses Jarred M.D.   On: 08/19/2019 19:21   Ct Head Code Stroke Wo Contrast  Result Date: 08/19/2019 CLINICAL DATA:  Code stroke.  Left hand numbness and dizziness EXAM: CT HEAD WITHOUT CONTRAST TECHNIQUE: Contiguous axial images were obtained from the base of the skull through the vertex without intravenous contrast. COMPARISON:  Head CT 06/27/2017 FINDINGS: Brain: There is no mass, hemorrhage or extra-axial collection. There has been progression of left occipital lobe encephalomalacia. No evidence of acute cortical infarct. Vascular: No abnormal hyperdensity of the major intracranial arteries or dural venous sinuses. No intracranial atherosclerosis. Skull: The visualized skull base, calvarium and extracranial soft tissues are normal. Sinuses/Orbits: No fluid levels or advanced mucosal thickening of the visualized paranasal sinuses. No mastoid or middle ear effusion. The orbits are normal. ASPECTS [facility] Stroke Program Early CT Score) - Ganglionic level infarction (caudate, lentiform nuclei, internal  capsule, insula, M1-M3 cortex): 7 - Supraganglionic infarction (M4-M6 cortex): 3 Total score (0-10 with 10 being normal): 10 IMPRESSION: 1. No acute  intracranial abnormality. 2. Progression of left occipital lobe encephalomalacia. 3. ASPECTS is 10. * These results were communicated to Dr. Roland Rack at 7:07 pm on 08/19/2019 by text page via the [facility] messaging system. Electronically Signed   By: Ulyses Jarred M.D.   On: 08/19/2019 19:07   2D Echocardiogram  1. Left ventricular ejection fraction, by visual estimation, is 65 to 70%. The left ventricle has normal function. There is no left ventricular hypertrophy.  2. Global right ventricle has normal systolic function.The right ventricular size is normal. No increase in right ventricular wall thickness.  3. Left atrial size was normal.  4. Right atrial size was normal.  5. The mitral valve is normal in structure. Trace mitral valve regurgitation. No evidence of mitral stenosis.  6. The tricuspid valve is normal in structure. Tricuspid valve regurgitation is mild.  7. The aortic valve is normal in structure. Aortic valve regurgitation is not visualized. No evidence of aortic valve sclerosis or stenosis.  8. The pulmonic valve was normal in structure. Pulmonic valve regurgitation is not visualized.  9. The inferior vena cava is normal in size with greater than 50% respiratory variability, suggesting right atrial pressure of 3 mmHg. 10. No source of intracardiac embolism.  PHYSICAL EXAM Obese middle-aged lady not in distress. . Afebrile. Head is nontraumatic. Neck is supple without bruit.    Cardiac exam no murmur or gallop. Lungs are clear to auscultation. Distal pulses are well felt. Neurological Exam ;  Awake  Alert oriented x 3. Normal speech and language.eye movements full without nystagmus.fundi were not visualized. Vision acuity and fields appear normal. Hearing is normal. Palatal movements are normal. Face symmetric. Tongue midline. Normal strength, tone, reflexes and coordination. Normal sensation. Gait deferred. ASSESSMENT/PLAN Sarah Bean is a 53 y.o. female with history  of morbid obestiy, HTN, RLS, prior stroke/TIA presenting with dizziness.   Stroke vs TIA, workup underway  Code Stroke CT head No acute abnormality. L occipital encephalomalacia. ASPECTS 10.     CTA head & neck normal  MRI  Canceled, pt refused d/t claustrophobia   Repeat CT head in am   2D Echo EF 65-70%. No source of embolus   LDL 87  HgbA1c 5.4  No VTE prophylaxis ordered - add SCDs   No antithrombotic prior to admission, now on aspirin 325 mg daily. Given TIA vs mild stroke, recommend aspirin 81 mg and plavix 75 mg daily x 3 weeks, then aspirin alone. Orders adjusted.   Therapy recommendations:  HH OT, no SLP  Disposition:  Anticipate return home  Hypertension  Stable . Permissive hypertension (OK if < 220/120) but gradually normalize in 5-7 days . Long-term BP goal normotensive  Hyperlipidemia  Home meds:  No statin  Now on lipitor 40  LDL 87, goal < 70  Continue statin at discharge  Other Stroke Risk Factors  Morbid Obesity, Body mass index is 38.79 kg/m., recommend weight loss, diet and exercise as appropriate   Hx stroke/TIA  05/2017 Suspect small left subcortical infarct- pt unable to tolerate MRI, OP therapy at d/c. Put on aspirin 325. Followed up with Janett Billow at [facility]  Consider OP evaluation for Obstructive sleep apnea  Other Active Problems  depression w/ anxiety  RLS  Hospital day # 0  I have personally obtained history,examined this patient, reviewed notes, independently viewed imaging studies, participated  in medical decision making and plan of care.ROS completed by me personally and pertinent positives fully documented  I have made any additions or clarifications directly to the above note.  She presented with recurrent episodes of transient dizziness and gait ataxia possibly posterior circulation TIAs.  She has a remote history of silent left occipital infarct.  Recommend continue ongoing stroke work-up.  Patient is extremely claustrophobic  and refusing MRI even with sedation.  Hence repeat CT scan of the head tomorrow morning.  Continue ongoing stroke work-up.  Recommend dual antiplatelet therapy of aspirin and Plavix for 3 weeks followed by aspirin alone.  Consider outpatient work-up for sleep apnea and she appears to be at risk.  Discussed with patient and Dr. Einar Grad.  Greater than 50% time during this 35-minute visit was spent on counseling and coordination of care about TIA and discussion about stroke risk factors and stroke prevention and treatment and answering questions  Antony Contras, MD Medical Director [place] Pager: [phone] 08/20/2019 3:23 PM   To contact Stroke Continuity provider, please refer to [email or web address] After hours, contact General Neurology

## 2019-08-20 NOTE — Progress Notes (Signed)
  Echocardiogram 2D Echocardiogram has been performed.  Shellye Zandi G Avary Eichenberger 08/20/2019, 10:57 AM

## 2019-08-20 NOTE — Progress Notes (Addendum)
Patient refuse to go for MRI, stated that she is cluster phobic. This nurse told her that she can get an order for ativan to help with the anxiety and patient stated that even if she takes the ativan she will still not be able to go for the MRI. Doctor on call notified of patient refusal to go for the test

## 2019-08-20 NOTE — Evaluation (Signed)
Physical Therapy Evaluation Patient Details Name: Sarah Bean MRN: [id number] DOB: 10-23-1965 Today's Date: 08/20/2019   History of Present Illness  Pt is a 54 yo female s/p Dizziness and L hand numbness. CT head negative. MRI pending. PMHx: HTN, depression, h/o CVA (L occipital lobe), obesity  Clinical Impression  Pt is mildly unsteady on her feet reporting lightheadedness and dizziness with mobility.  Mildly staggering gait pattern.  Strength is mildly weak, but symmetrical, sensation and coordination intact in LEs.  Some saccadic movement of her eyes with tracking.  She would benefit from f/u [facility] PT for balance and gait training.  May try bari rollator next session (both so she can sit down when she gets dizzy, and to see if it makes her more steady).   PT to follow acutely for deficits listed below.      Follow Up Recommendations Home health PT;Supervision/Assistance - 24 hour(for first few days)    Equipment Recommendations  None recommended by PT    Recommendations for Other Services   NA    Precautions / Restrictions Precautions Precautions: Fall;Other (comment) Precaution Comments: lightheadedness Restrictions Weight Bearing Restrictions: No      Mobility  Bed Mobility Overal bed mobility: Modified Independent             General bed mobility comments: used railing for leverage  Transfers Overall transfer level: Needs assistance Equipment used: None Transfers: Sit to/from Stand Sit to Stand: Min guard         General transfer comment: unsteadiness with initial standing balance  Ambulation/Gait Ambulation/Gait assistance: Min guard Gait Distance (Feet): 40 Feet(x2 with seated rest) Assistive device: 1 person hand held assist(hallway railing) Gait Pattern/deviations: Step-through pattern;Staggering left;Staggering right Gait velocity: decreased Gait velocity interpretation: 1.31 - 2.62 ft/sec, indicative of limited community ambulator General Gait  Details: Pt with midly staggering gait pattern, reporting dizziness, feels like she is drunk.       Modified Rankin (Stroke Patients Only) Modified Rankin (Stroke Patients Only) Pre-Morbid Rankin Score: No symptoms Modified Rankin: Moderately severe disability     Balance Overall balance assessment: Needs assistance Sitting-balance support: No upper extremity supported;Feet supported Sitting balance-Leahy Scale: Fair Sitting balance - Comments: supervision EOB.    Standing balance support: No upper extremity supported Standing balance-Leahy Scale: Fair Standing balance comment: close supervision         Rhomberg - Eyes Opened: 30 Rhomberg - Eyes Closed: 10                 Pertinent Vitals/Pain Pain Assessment: No/denies pain    Home Living Family/patient expects to be discharged to:: Private residence Living Arrangements: Alone Available Help at Discharge: Family;Available PRN/intermittently Type of Home: House Home Access: Stairs to enter Entrance Stairs-Rails: Right Entrance Stairs-Number of Steps: 2 Home Layout: One level Home Equipment: None Additional Comments: Pt is planning to stay with her daughter until she feels better    Prior Function Level of Independence: Independent               Hand Dominance   Dominant Hand: Right    Extremity/Trunk Assessment   Upper Extremity Assessment Upper Extremity Assessment: Defer to OT evaluation    Lower Extremity Assessment Lower Extremity Assessment: Generalized weakness(bed level grossly equal, sensation intact to LT, coord WNL )    Cervical / Trunk Assessment Cervical / Trunk Assessment: Normal  Communication   Communication: No difficulties  Cognition Arousal/Alertness: Awake/alert Behavior During Therapy: WFL for tasks assessed/performed Overall Cognitive Status: Within  Functional Limits for tasks assessed                                 General Comments: Not specifically  tested, conversation normal, hx matched what she told OT      General Comments General comments (skin integrity, edema, etc.): Educated pt re: fall precautions: turn lights on when she gets up at night, sit for a few extra and stand for a few extra seconds before proceeding to walk. Basic occulomotor assessment revealed saccadic tracking, difficulty with gaze stability testing.  If continued reports of dizziness and negative MRI may need to investigate vestibular causes of dizziness further.          Assessment/Plan    PT Assessment Patient needs continued PT services  PT Problem List Decreased strength;Decreased activity tolerance;Decreased balance;Decreased mobility;Decreased knowledge of precautions;Obesity       PT Treatment Interventions DME instruction;Gait training;Stair training;Functional mobility training;Therapeutic activities;Therapeutic exercise;Balance training;Neuromuscular re-education;Patient/family education    PT Goals (Current goals can be found in the Care Plan section)  Acute Rehab PT Goals Patient Stated Goal: to go home PT Goal Formulation: With patient Time For Goal Achievement: 09/03/19 Potential to Achieve Goals: Good    Frequency Min 4X/week        AM-PAC PT "6 Clicks" Mobility  Outcome Measure Help needed turning from your back to your side while in a flat bed without using bedrails?: A Little Help needed moving from lying on your back to sitting on the side of a flat bed without using bedrails?: A Little Help needed moving to and from a bed to a chair (including a wheelchair)?: A Little Help needed standing up from a chair using your arms (e.g., wheelchair or bedside chair)?: A Little Help needed to walk in hospital room?: A Little Help needed climbing 3-5 steps with a railing? : A Little 6 Click Score: 18    End of Session   Activity Tolerance: Patient limited by fatigue Patient left: in bed;with call bell/phone within reach   PT Visit  Diagnosis: Muscle weakness (generalized) (M62.81);Dizziness and giddiness (R42)    Time: 1746-1810 PT Time Calculation (min) (ACUTE ONLY): 24 min   Charges:           Wells Guiles B. Sena Hoopingarner, PT, DPT  Acute Rehabilitation [phone] pager [phone]) [phone] office  @ [facility]: [phone]   PT Evaluation $PT Eval Moderate Complexity: 1 Mod PT Treatments $Gait Training: 8-22 mins

## 2019-08-20 NOTE — Evaluation (Signed)
Speech Language Pathology Evaluation Patient Details Name: MELECIA CREMEANS MRN: [id number] DOB: 1966/03/21 Today's Date: 08/20/2019 Time: [id number] SLP Time Calculation (min) (ACUTE ONLY): 31 min  Problem List:  Patient Active Problem List   Diagnosis Date Noted  . TIA (transient ischemic attack) 08/19/2019  . Morbid obesity ([place]) 08/19/2019  . Essential hypertension 05/26/2017  . Hyperglycemia 05/26/2017  . Microcytic anemia 05/26/2017  . CVA (cerebral vascular accident) ([place]) 05/25/2017   Past Medical History:  Past Medical History:  Diagnosis Date  . Asthma   . Essential hypertension   . Essential hypertension 05/26/2017  . Insomnia   . Manic depression ([place])   . Obesity   . Restless leg syndrome   . Stroke [facility])    Past Surgical History:  Past Surgical History:  Procedure Laterality Date  . BREAST LUMPECTOMY Left   . CESAREAN SECTION     x3   HPI:  Pt is a 53 yo female s/p Dizziness and L hand numbness. CT head negative. MRI pending. PMHx: HTN, depression, h/o CVA, obesity.    Assessment / Plan / Recommendation Clinical Impression  Pt was seen for a cognitive-linguistic evaluation in the setting of a possible CVA/TIA.  Pt reported that she lives alone and that she is responsible for her IADLs (medications, finances, etc.) at baseline. Pt was encountered awake/alert, sitting upright in bed and she was agreeable to ST evaluation.  Pt completed the [facility] Cognitive Assessment [facility]) and she scored overall 25/30 (norm >/=26/30), indicating minimal deficits in the areas of thought organization, short-term memory, and executive functioning.  All other areas evaluated were WNL.  In addition,  pt was oriented x6 and she independently completed a functional IADL task involving attention, problem solving, and expressive/receptive language (ordering her lunch tray from the menu) without difficulty.  Suspect that pt's cognitive linguistic abilities are at baseline.  She was educated  regarding short-term memory strategies and all recommendations and she verbalized understanding.  Recommend supervision with IADLs when pt is first discharged home; however, no additional ST is recommended at time of discharge.  No further skilled ST is warranted at this time.  Please re-consult if additional needs arise.    SLP Assessment  SLP Recommendation/Assessment: Patient does not need any further Speech Lanaguage Pathology Services SLP Visit Diagnosis: Cognitive communication deficit (R41.841)    Follow Up Recommendations  None    Frequency and Duration           SLP Evaluation Cognition  Overall Cognitive Status: No family/caregiver present to determine baseline cognitive functioning Arousal/Alertness: Awake/alert Orientation Level: Oriented X4 Attention: Sustained Sustained Attention: Appears intact Memory: Impaired Memory Impairment: Retrieval deficit Awareness: Appears intact Problem Solving: Appears intact Executive Function: Organizing;Reasoning Reasoning: Appears intact Organizing: Impaired Organizing Impairment: Functional complex Safety/Judgment: Appears intact       Comprehension  Auditory Comprehension Overall Auditory Comprehension: Appears within functional limits for tasks assessed Reading Comprehension Reading Status: Within funtional limits    Expression Expression Primary Mode of Expression: Verbal Verbal Expression Overall Verbal Expression: Appears within functional limits for tasks assessed Written Expression Dominant Hand: Right   Oral / Motor  Motor Speech Overall Motor Speech: Appears within functional limits for tasks assessed     Colin Mulders M.S., CCC-SLP Acute Rehabilitation Services Office: [phone]                   [facility] 08/20/2019, 11:56 AM

## 2019-08-21 ENCOUNTER — Observation Stay (HOSPITAL_COMMUNITY): Payer: Medicaid Other

## 2019-08-21 DIAGNOSIS — G459 Transient cerebral ischemic attack, unspecified: Secondary | ICD-10-CM

## 2019-08-21 DIAGNOSIS — R42 Dizziness and giddiness: Secondary | ICD-10-CM | POA: Diagnosis not present

## 2019-08-21 LAB — CBC WITH DIFFERENTIAL/PLATELET
Abs Immature Granulocytes: 0.02 10*3/uL (ref 0.00–0.07)
Basophils Absolute: 0 10*3/uL (ref 0.0–0.1)
Basophils Relative: 0 %
Eosinophils Absolute: 0.2 10*3/uL (ref 0.0–0.5)
Eosinophils Relative: 2 %
HCT: 26 % — ABNORMAL LOW (ref 36.0–46.0)
Hemoglobin: 7.6 g/dL — ABNORMAL LOW (ref 12.0–15.0)
Immature Granulocytes: 0 %
Lymphocytes Relative: 29 %
Lymphs Abs: 2.7 10*3/uL (ref 0.7–4.0)
MCH: 19 pg — ABNORMAL LOW (ref 26.0–34.0)
MCHC: 29.2 g/dL — ABNORMAL LOW (ref 30.0–36.0)
MCV: 64.8 fL — ABNORMAL LOW (ref 80.0–100.0)
Monocytes Absolute: 0.6 10*3/uL (ref 0.1–1.0)
Monocytes Relative: 6 %
Neutro Abs: 5.7 10*3/uL (ref 1.7–7.7)
Neutrophils Relative %: 63 %
Platelets: 355 10*3/uL (ref 150–400)
RBC: 4.01 MIL/uL (ref 3.87–5.11)
RDW: 19.4 % — ABNORMAL HIGH (ref 11.5–15.5)
WBC: 9.1 10*3/uL (ref 4.0–10.5)
nRBC: 0 % (ref 0.0–0.2)

## 2019-08-21 LAB — BASIC METABOLIC PANEL
Anion gap: 8 (ref 5–15)
BUN: 14 mg/dL (ref 6–20)
CO2: 21 mmol/L — ABNORMAL LOW (ref 22–32)
Calcium: 8.8 mg/dL — ABNORMAL LOW (ref 8.9–10.3)
Chloride: 109 mmol/L (ref 98–111)
Creatinine, Ser: 0.76 mg/dL (ref 0.44–1.00)
GFR calc Af Amer: >60 mL/min (ref >60)
GFR calc non Af Amer: >60 mL/min (ref >60)
Glucose, Bld: 87 mg/dL (ref 70–99)
Potassium: 3.7 mmol/L (ref 3.5–5.1)
Sodium: 138 mmol/L (ref 135–145)

## 2019-08-21 MED ORDER — ATORVASTATIN CALCIUM 40 MG PO TABS: 40.0000 mg | ORAL_TABLET | Freq: Every day | ORAL | 0 refills | Status: DC

## 2019-08-21 MED ORDER — CLOPIDOGREL BISULFATE 75 MG PO TABS: 75.0000 mg | ORAL_TABLET | Freq: Every day | ORAL | 0 refills | Status: AC

## 2019-08-21 MED ORDER — ASPIRIN 81 MG PO TBEC: 81.0000 mg | DELAYED_RELEASE_TABLET | Freq: Every day | ORAL | 0 refills | Status: AC

## 2019-08-21 MED ORDER — LISINOPRIL 20 MG PO TABS: 20.0000 mg | ORAL_TABLET | Freq: Every day | ORAL | 0 refills | Status: DC

## 2019-08-21 NOTE — Discharge Summary (Addendum)
Physician Discharge Summary  RIANNE BASARA [id number] DOB: 09-Jul-1966 DOA: 08/19/2019  PCP[facility]  Admit date: 08/19/2019 Discharge date: 08/21/2019  Admitted From: Home Disposition: Home  Recommendations for Outpatient Follow-up:  1. Follow up with PCP in 1-2 weeks 2. Please obtain BMP/CBC in one week 3. Please follow up on the following pending results:  Home Health: Yes Equipment/Devices: None  Discharge Condition: Stable CODE STATUS: Full code Diet recommendation: Cardiac  Subjective: Seen and examined.  She states that all her symptoms have resolved.  Does not have any other numbness or tingling in the left hand.  Brief/Interim Summary: Sarah Bean a 53 y.o.femalewith medical history significant ofhypertension, depression, previous CVA, asthma and morbid obesity who presented with dizziness and left hand numbness that started around 530 p.m. the evening of admission. She was brought in as a code stroke. Evaluated and found to be having a low stroke score. Not a candidate of TPA. Patient however has risk factors for CVA. She complained of dizziness worse with movement especially standing up. Denied any focal weakness. Patient also has some mild headache. The tingling and numbness in the left hand had improved somewhat while in the ER.Initial head CT showed no acute findings.  She was hemodynamically stable.  COVID-19 negative.  Admitted to hospital service and neurology consulted.  She underwent CT angiogram of the head and neck which was also unremarkable for any large vessel stenosis.  Neurology recommended MRI but patient is claustrophobic so she refused to do so and thus neurology repeated her CT scan of the head today which again did not show any acute stroke but it showed chronic stroke which happened in 2018.  Patient symptoms have resolved completely.  She was started on aspirin and Plavix during this hospitalization as well  as atorvastatin.  Patient was supposed to continue to take aspirin 325 mg after she had a stroke in 2018 but after talking to patient, she states that she was never told so and thus she was not taking any aspirin.  I discussed the case with Dr. Erlinda Hong from neurology who recommended continuing both Plavix and aspirin for 21 days and then stopping Plavix and continuing aspirin.  Also continuing atorvastatin.  She is being discharged in stable condition.  She was evaluated by PT OT and they recommended home health PT which has been ordered for her but patient has declined that.  In order to allow permissive hypertension, her lisinopril hydrochlorothiazide combo was held and her blood pressure has remained normal to low normal.  In order to allow permissive hypertension and slowly bring the blood pressure down over the course of 4 to 5 days and the fact that patient's blood pressures in fact is on low normal, I am discontinuing both of these medications and have advised the patient to only start on lisinopril 20 mg starting Monday.  Discharge Diagnoses:  Principal Problem:   TIA (transient ischemic attack) Active Problems:   Essential hypertension   Hyperglycemia   Morbid obesity [facility])    Discharge Instructions  Discharge Instructions    Discharge patient   Complete by: As directed    Discharge disposition: 06-Home-Health Care Svc   Discharge patient date: 08/21/2019     Allergies as of 08/21/2019   No Known Allergies     Medication List    STOP taking these medications   aspirin 325 MG tablet Replaced by: aspirin 81 MG EC tablet   lisinopril-hydrochlorothiazide 20-25 MG tablet Commonly  known as: ZESTORETIC   naproxen 375 MG tablet Commonly known as: NAPROSYN     TAKE these medications   acetaminophen 500 MG tablet Commonly known as: TYLENOL Take 500-1,000 mg by mouth every 8 (eight) hours as needed for mild pain or headache.   albuterol 108 (90 Base) MCG/ACT inhaler Commonly  known as: VENTOLIN HFA Inhale 1-2 puffs into the lungs every 4 (four) hours as needed for wheezing or shortness of breath.   aspirin 81 MG EC tablet Take 1 tablet (81 mg total) by mouth daily. Replaces: aspirin 325 MG tablet   atorvastatin 40 MG tablet Commonly known as: LIPITOR Take 1 tablet (40 mg total) by mouth daily at 6 PM.   clopidogrel 75 MG tablet Commonly known as: PLAVIX Take 1 tablet (75 mg total) by mouth daily for 21 days.   ferrous sulfate 325 (65 FE) MG tablet Take 1 tablet (325 mg total) by mouth daily.   Futuro Soft Cervical Collar Misc 1 Device by Does not apply route daily as needed.   ibuprofen 200 MG tablet Commonly known as: ADVIL Take 400 mg by mouth every 6 (six) hours as needed for headache or mild pain.   lisinopril 20 MG tablet Commonly known as: ZESTRIL Take 1 tablet (20 mg total) by mouth daily. Start taking on: August 23, 2019   methocarbamol 500 MG tablet Commonly known as: ROBAXIN Take 1 tablet (500 mg total) by mouth 3 (three) times daily as needed for muscle spasms.   Pamprin Multi-Symptom 500-25-15 MG Tabs Generic drug: APAP-Pamabrom-Pyrilamine Take 1-2 tablets by mouth every 8 (eight) hours as needed (for cramping or discomfort).   QUEtiapine 100 MG tablet Commonly known as: SEROQUEL Take 100 mg by mouth at bedtime.   rOPINIRole 0.25 MG tablet Commonly known as: REQUIP Take 0.25 mg by mouth See admin instructions. Take 0.25 mg by mouth 1-3 hours before bedtime   traZODone 100 MG tablet Commonly known as: DESYREL Take 100 mg by mouth at bedtime.   Vivarin 200 MG Tabs tablet Generic drug: caffeine Take 200 mg by mouth every 6 (six) hours as needed (for alertness).      [place], [facility] Follow up in 1 week(s).   Specialty: Pediatrics Contact information: [place] [address] [phone]          No Known Allergies  Consultations:  Neurology   Procedures/Studies: Ct Angio Head W Or Wo Contrast  Result Date: 08/19/2019 CLINICAL DATA:  Left hand numbness EXAM: CT ANGIOGRAPHY HEAD AND NECK TECHNIQUE: Multidetector CT imaging of the head and neck was performed using the standard protocol during bolus administration of intravenous contrast. Multiplanar CT image reconstructions and MIPs were obtained to evaluate the vascular anatomy. Carotid stenosis measurements (when applicable) are obtained utilizing NASCET criteria, using the distal internal carotid diameter as the denominator. CONTRAST:  44mL OMNIPAQUE IOHEXOL 350 MG/ML SOLN COMPARISON:  Head CT same day FINDINGS: CTA NECK FINDINGS SKELETON: There is no bony spinal canal stenosis. No lytic or blastic lesion. OTHER NECK: Normal pharynx, larynx and major salivary glands. No cervical lymphadenopathy. Unremarkable thyroid gland. UPPER CHEST: No pneumothorax or pleural effusion. No nodules or masses. AORTIC ARCH: There is no calcific atherosclerosis of the aortic arch. There is no aneurysm, dissection or hemodynamically significant stenosis of the visualized portion of the aorta. Conventional 3 vessel aortic branching pattern. The visualized proximal subclavian arteries are widely patent. RIGHT CAROTID SYSTEM: Normal without aneurysm, dissection  or stenosis. LEFT CAROTID SYSTEM: Normal without aneurysm, dissection or stenosis. VERTEBRAL ARTERIES: Left dominant configuration. Both origins are clearly patent. There is no dissection, occlusion or flow-limiting stenosis to the skull base (V1-V3 segments). CTA HEAD FINDINGS POSTERIOR CIRCULATION: --Vertebral arteries: Normal V4 segments. --Posterior inferior cerebellar arteries (PICA): Patent origins from the vertebral arteries. --Anterior inferior cerebellar arteries (AICA): Patent origins from the basilar artery. --Basilar artery: Normal. --Superior cerebellar arteries: Normal. --Posterior cerebral arteries: Normal. The right PCA is  predominantly supplied by the posterior communicating artery. ANTERIOR CIRCULATION: --Intracranial internal carotid arteries: Normal. --Anterior cerebral arteries (ACA): Normal. Both A1 segments are present. Patent anterior communicating artery (a-comm). --Middle cerebral arteries (MCA): Normal. VENOUS SINUSES: As permitted by contrast timing, patent. ANATOMIC VARIANTS: None Review of the MIP images confirms the above findings. IMPRESSION: Normal CTA of the head and neck. Electronically Signed   By: Ulyses Jarred M.D.   On: 08/19/2019 19:21   Ct Head Wo Contrast  Result Date: 08/21/2019 CLINICAL DATA:  Stroke follow-up EXAM: CT HEAD WITHOUT CONTRAST TECHNIQUE: Contiguous axial images were obtained from the base of the skull through the vertex without intravenous contrast. COMPARISON:  Brain MRI 09/09/2017 Head CT 08/19/2019 FINDINGS: Brain: There is no mass, hemorrhage or extra-axial collection. The size and configuration of the ventricles and extra-axial CSF spaces are normal. There is an old left occipital infarct, unchanged. Vascular: No abnormal hyperdensity of the major intracranial arteries or dural venous sinuses. No intracranial atherosclerosis. Skull: The visualized skull base, calvarium and extracranial soft tissues are normal. Sinuses/Orbits: No fluid levels or advanced mucosal thickening of the visualized paranasal sinuses. No mastoid or middle ear effusion. The orbits are normal. IMPRESSION: Old left occipital infarct, unchanged, without acute intracranial abnormality. Electronically Signed   By: Ulyses Jarred M.D.   On: 08/21/2019 04:23   Ct Angio Neck W Or Wo Contrast  Result Date: 08/19/2019 CLINICAL DATA:  Left hand numbness EXAM: CT ANGIOGRAPHY HEAD AND NECK TECHNIQUE: Multidetector CT imaging of the head and neck was performed using the standard protocol during bolus administration of intravenous contrast. Multiplanar CT image reconstructions and MIPs were obtained to evaluate the  vascular anatomy. Carotid stenosis measurements (when applicable) are obtained utilizing NASCET criteria, using the distal internal carotid diameter as the denominator. CONTRAST:  28mL OMNIPAQUE IOHEXOL 350 MG/ML SOLN COMPARISON:  Head CT same day FINDINGS: CTA NECK FINDINGS SKELETON: There is no bony spinal canal stenosis. No lytic or blastic lesion. OTHER NECK: Normal pharynx, larynx and major salivary glands. No cervical lymphadenopathy. Unremarkable thyroid gland. UPPER CHEST: No pneumothorax or pleural effusion. No nodules or masses. AORTIC ARCH: There is no calcific atherosclerosis of the aortic arch. There is no aneurysm, dissection or hemodynamically significant stenosis of the visualized portion of the aorta. Conventional 3 vessel aortic branching pattern. The visualized proximal subclavian arteries are widely patent. RIGHT CAROTID SYSTEM: Normal without aneurysm, dissection or stenosis. LEFT CAROTID SYSTEM: Normal without aneurysm, dissection or stenosis. VERTEBRAL ARTERIES: Left dominant configuration. Both origins are clearly patent. There is no dissection, occlusion or flow-limiting stenosis to the skull base (V1-V3 segments). CTA HEAD FINDINGS POSTERIOR CIRCULATION: --Vertebral arteries: Normal V4 segments. --Posterior inferior cerebellar arteries (PICA): Patent origins from the vertebral arteries. --Anterior inferior cerebellar arteries (AICA): Patent origins from the basilar artery. --Basilar artery: Normal. --Superior cerebellar arteries: Normal. --Posterior cerebral arteries: Normal. The right PCA is predominantly supplied by the posterior communicating artery. ANTERIOR CIRCULATION: --Intracranial internal carotid arteries: Normal. --Anterior cerebral arteries (ACA): Normal. Both A1 segments are  present. Patent anterior communicating artery (a-comm). --Middle cerebral arteries (MCA): Normal. VENOUS SINUSES: As permitted by contrast timing, patent. ANATOMIC VARIANTS: None Review of the MIP images  confirms the above findings. IMPRESSION: Normal CTA of the head and neck. Electronically Signed   By: Ulyses Jarred M.D.   On: 08/19/2019 19:21   Ct Head Code Stroke Wo Contrast  Result Date: 08/19/2019 CLINICAL DATA:  Code stroke.  Left hand numbness and dizziness EXAM: CT HEAD WITHOUT CONTRAST TECHNIQUE: Contiguous axial images were obtained from the base of the skull through the vertex without intravenous contrast. COMPARISON:  Head CT 06/27/2017 FINDINGS: Brain: There is no mass, hemorrhage or extra-axial collection. There has been progression of left occipital lobe encephalomalacia. No evidence of acute cortical infarct. Vascular: No abnormal hyperdensity of the major intracranial arteries or dural venous sinuses. No intracranial atherosclerosis. Skull: The visualized skull base, calvarium and extracranial soft tissues are normal. Sinuses/Orbits: No fluid levels or advanced mucosal thickening of the visualized paranasal sinuses. No mastoid or middle ear effusion. The orbits are normal. ASPECTS [facility] Stroke Program Early CT Score) - Ganglionic level infarction (caudate, lentiform nuclei, internal capsule, insula, M1-M3 cortex): 7 - Supraganglionic infarction (M4-M6 cortex): 3 Total score (0-10 with 10 being normal): 10 IMPRESSION: 1. No acute intracranial abnormality. 2. Progression of left occipital lobe encephalomalacia. 3. ASPECTS is 10. * These results were communicated to Dr. Roland Rack at 7:07 pm on 08/19/2019 by text page via the [facility] messaging system. Electronically Signed   By: Ulyses Jarred M.D.   On: 08/19/2019 19:07      Discharge Exam: Vitals:   08/20/19 2345 08/21/19 0311  BP: 104/64 134/83  Pulse: (!) 58 79  Resp: 18 18  Temp: 98.6 F (37 C) 98.5 F (36.9 C)  SpO2: 97% 98%   Vitals:   08/20/19 1600 08/20/19 1959 08/20/19 2345 08/21/19 0311  BP: 103/63 115/63 104/64 134/83  Pulse:  70 (!) 58 79  Resp: 18 18 18 18   Temp: 98.6 F (37 C) 99 F (37.2 C) 98.6 F  (37 C) 98.5 F (36.9 C)  TempSrc: Oral Oral Oral Oral  SpO2: 97% 98% 97% 98%  Weight:      Height:        General: Pt is alert, awake, not in acute distress Cardiovascular: RRR, S1/S2 +, no rubs, no gallops Respiratory: CTA bilaterally, no wheezing, no rhonchi Abdominal: Soft, NT, ND, bowel sounds + Extremities: no edema, no cyanosis    The results of significant diagnostics from this hospitalization (including imaging, microbiology, ancillary and laboratory) are listed below for reference.     Microbiology: Recent Results (from the past 240 hour(s))  SARS CORONAVIRUS 2 (TAT 6-24 HRS) Nasopharyngeal Nasopharyngeal Swab     Status: None   Collection Time: 08/19/19  7:41 PM   Specimen: Nasopharyngeal Swab  Result Value Ref Range Status   SARS Coronavirus 2 NEGATIVE NEGATIVE Final    Comment: (NOTE) SARS-CoV-2 target nucleic acids are NOT DETECTED. The SARS-CoV-2 RNA is generally detectable in upper and lower respiratory specimens during the acute phase of infection. Negative results do not preclude SARS-CoV-2 infection, do not rule out co-infections with other pathogens, and should not be used as the sole basis for treatment or other patient management decisions. Negative results must be combined with clinical observations, patient history, and epidemiological information. The expected result is Negative. Fact Sheet for Patients: SugarRoll.be Fact Sheet for Healthcare Providers: https://www.woods-mathews.com/ This test is not yet approved or cleared by the  [place] Architectural technologist and  has been authorized for detection and/or diagnosis of SARS-CoV-2 by FDA under an Print production planner (EUA). This EUA will remain  in effect (meaning this test can be used) for the duration of the COVID-19 declaration under Section 56 4(b)(1) of the Act, 21 U.S.C. section 360bbb-3(b)(1), unless the authorization is terminated or revoked  sooner. Performed at [facility], [place] [address], [place], [place] [address]      Labs: BNP (last 3 results) No results for input(s): BNP in the last 8760 hours. Basic Metabolic Panel: Recent Labs  Lab 08/19/19 1847 08/19/19 1854 08/20/19 0814 08/21/19 0302  NA 137 139 136 138  K 3.8 3.9 4.2 3.7  CL 105 104 106 109  CO2 22  --  20* 21*  GLUCOSE 106* 101* 77 87  BUN 12 13 12 14   CREATININE 0.80 0.80 0.82 0.76  CALCIUM 9.2  --  9.2 8.8*   Liver Function Tests: Recent Labs  Lab 08/19/19 1847  AST 12*  ALT 10  ALKPHOS 58  BILITOT 0.4  PROT 6.7  ALBUMIN 3.3*   No results for input(s): LIPASE, AMYLASE in the last 168 hours. No results for input(s): AMMONIA in the last 168 hours. CBC: Recent Labs  Lab 08/19/19 1847 08/19/19 1854 08/20/19 0814 08/21/19 0302  WBC 8.7  --  7.7 9.1  NEUTROABS 6.4  --  5.0 5.7  HGB 8.3* 10.2* 7.9* 7.6*  HCT 29.0* 30.0* 27.8* 26.0*  MCV 66.7*  --  66.5* 64.8*  PLT 406*  --  377 355   Cardiac Enzymes: No results for input(s): CKTOTAL, CKMB, CKMBINDEX, TROPONINI in the last 168 hours. BNP: Invalid input(s): POCBNP CBG: Recent Labs  Lab 08/19/19 1844 08/19/19 2213  GLUCAP 96 98   D-Dimer No results for input(s): DDIMER in the last 72 hours. Hgb A1c Recent Labs    08/20/19 0337  HGBA1C 5.4   Lipid Profile Recent Labs    08/20/19 0337  CHOL 147  HDL 55  LDLCALC 87  TRIG 24  CHOLHDL 2.7   Thyroid function studies No results for input(s): TSH, T4TOTAL, T3FREE, THYROIDAB in the last 72 hours.  Invalid input(s): FREET3 Anemia work up No results for input(s): VITAMINB12, FOLATE, FERRITIN, TIBC, IRON, RETICCTPCT in the last 72 hours. Urinalysis No results found for: COLORURINE, APPEARANCEUR, [place], [place], GLUCOSEU, [place], [place], [place], PROTEINUR, UROBILINOGEN, NITRITE, LEUKOCYTESUR Sepsis Labs Invalid input(s): PROCALCITONIN,  WBC,  LACTICIDVEN Microbiology Recent Results (from the past 240  hour(s))  SARS CORONAVIRUS 2 (TAT 6-24 HRS) Nasopharyngeal Nasopharyngeal Swab     Status: None   Collection Time: 08/19/19  7:41 PM   Specimen: Nasopharyngeal Swab  Result Value Ref Range Status   SARS Coronavirus 2 NEGATIVE NEGATIVE Final    Comment: (NOTE) SARS-CoV-2 target nucleic acids are NOT DETECTED. The SARS-CoV-2 RNA is generally detectable in upper and lower respiratory specimens during the acute phase of infection. Negative results do not preclude SARS-CoV-2 infection, do not rule out co-infections with other pathogens, and should not be used as the sole basis for treatment or other patient management decisions. Negative results must be combined with clinical observations, patient history, and epidemiological information. The expected result is Negative. Fact Sheet for Patients: SugarRoll.be Fact Sheet for Healthcare Providers: https://www.woods-mathews.com/ This test is not yet approved or cleared by the [place] FDA and  has been authorized for detection and/or diagnosis of SARS-CoV-2 by FDA under an Emergency Use Authorization (EUA). This EUA will remain  in effect (meaning this test can be used) for the duration of the COVID-19 declaration under Section 56 4(b)(1) of the Act, 21 U.S.C. section 360bbb-3(b)(1), unless the authorization is terminated or revoked sooner. Performed at [facility], [place] [address] [place], [place] [address]      Time coordinating discharge: Over 30 minutes  SIGNED:   Darliss Cheney, MD  [facility] 08/21/2019, 8:20 AM  If 7PM-7AM, please contact night-coverage [email or web address] Password [email or web address]

## 2019-08-21 NOTE — Progress Notes (Signed)
Patient discharged home, transportation provided by boyfriend. Educated patient on discharge packet, questions answered. Belongings returned to patient.  Milad Bublitz A Moneka Mcquinn

## 2019-08-21 NOTE — Plan of Care (Signed)
CT repeat no acute infarct. Hb this am 7.6 stable at her baseline as per Dr. Doristine Bosworth. OK to continue DAPT for 3 weeks and then ASA 81 alone. Continue statin. Will set up neurology follow up in 4 weeks.   Rosalin Hawking, MD PhD Stroke Neurology 08/21/2019 12:18 PM

## 2019-08-21 NOTE — Care Management (Signed)
Spoke w patient and she is declining [facility] services. Her boyfriend will provide transportation home. No other CM needs identified.

## 2019-08-21 NOTE — Discharge Instructions (Signed)
Stroke Prevention °Some medical conditions and behaviors are associated with a higher chance of having a stroke. You can help prevent a stroke by making nutrition, lifestyle, and other changes, including managing any medical conditions you may have. °What nutrition changes can be made? ° °· Eat healthy foods. You can do this by: °? Choosing foods high in fiber, such as fresh fruits and vegetables and whole grains. °? Eating at least 5 or more servings of fruits and vegetables a day. Try to fill half of your plate at each meal with fruits and vegetables. °? Choosing lean protein foods, such as lean cuts of meat, poultry without skin, fish, tofu, beans, and nuts. °? Eating low-fat dairy products. °? Avoiding foods that are high in salt (sodium). This can help lower blood pressure. °? Avoiding foods that have saturated fat, trans fat, and cholesterol. This can help prevent high cholesterol. °? Avoiding processed and premade foods. °· Follow your health care provider's specific guidelines for losing weight, controlling high blood pressure (hypertension), lowering high cholesterol, and managing diabetes. These may include: °? Reducing your daily calorie intake. °? Limiting your daily sodium intake to 1,500 milligrams (mg). °? Using only healthy fats for cooking, such as olive oil, canola oil, or sunflower oil. °? Counting your daily carbohydrate intake. °What lifestyle changes can be made? °· Maintain a healthy weight. Talk to your health care provider about your ideal weight. °· Get at least 30 minutes of moderate physical activity at least 5 days a week. Moderate activity includes brisk walking, biking, and swimming. °· Do not use any products that contain nicotine or tobacco, such as cigarettes and e-cigarettes. If you need help quitting, ask your health care provider. It may also be helpful to avoid exposure to secondhand smoke. °· Limit alcohol intake to no more than 1 drink a day for nonpregnant women and 2 drinks  a day for men. One drink equals 12 oz of beer, 5 oz of wine, or 1½ oz of hard liquor. °· Stop any illegal drug use. °· Avoid taking birth control pills. Talk to your health care provider about the risks of taking birth control pills if: °? You are over 35 years old. °? You smoke. °? You get migraines. °? You have ever had a blood clot. °What other changes can be made? °· Manage your cholesterol levels. °? Eating a healthy diet is important for preventing high cholesterol. If cholesterol cannot be managed through diet alone, you may also need to take medicines. °? Take any prescribed medicines to control your cholesterol as told by your health care provider. °· Manage your diabetes. °? Eating a healthy diet and exercising regularly are important parts of managing your blood sugar. If your blood sugar cannot be managed through diet and exercise, you may need to take medicines. °? Take any prescribed medicines to control your diabetes as told by your health care provider. °· Control your hypertension. °? To reduce your risk of stroke, try to keep your blood pressure below 130/80. °? Eating a healthy diet and exercising regularly are an important part of controlling your blood pressure. If your blood pressure cannot be managed through diet and exercise, you may need to take medicines. °? Take any prescribed medicines to control hypertension as told by your health care provider. °? Ask your health care provider if you should monitor your blood pressure at home. °? Have your blood pressure checked every year, even if your blood pressure is normal. Blood pressure increases   with age and some medical conditions.  Get evaluated for sleep disorders (sleep apnea). Talk to your health care provider about getting a sleep evaluation if you snore a lot or have excessive sleepiness.  Take over-the-counter and prescription medicines only as told by your health care provider. Aspirin or blood thinners (antiplatelets or  anticoagulants) may be recommended to reduce your risk of forming blood clots that can lead to stroke.  Make sure that any other medical conditions you have, such as atrial fibrillation or atherosclerosis, are managed. What are the warning signs of a stroke? The warning signs of a stroke can be easily remembered as BEFAST.  B is for balance. Signs include: ? Dizziness. ? Loss of balance or coordination. ? Sudden trouble walking.  E is for eyes. Signs include: ? A sudden change in vision. ? Trouble seeing.  F is for face. Signs include: ? Sudden weakness or numbness of the face. ? The face or eyelid drooping to one side.  A is for arms. Signs include: ? Sudden weakness or numbness of the arm, usually on one side of the body.  S is for speech. Signs include: ? Trouble speaking (aphasia). ? Trouble understanding.  T is for time. ? These symptoms may represent a serious problem that is an emergency. Do not wait to see if the symptoms will go away. Get medical help right away. Call your local emergency services (911 in the [place] Do not drive yourself to the hospital.  Other signs of stroke may include: ? A sudden, severe headache with no known cause. ? Nausea or vomiting. ? Seizure. Where to find more information For more information, visit:  American Stroke Association: www.strokeassociation.org  National Stroke Association: www.stroke.org Summary  You can prevent a stroke by eating healthy, exercising, not smoking, limiting alcohol intake, and managing any medical conditions you may have.  Do not use any products that contain nicotine or tobacco, such as cigarettes and e-cigarettes. If you need help quitting, ask your health care provider. It may also be helpful to avoid exposure to secondhand smoke.  Remember BEFAST for warning signs of stroke. Get help right away if you or a loved one has any of these signs. This information is not intended to replace advice given to you  by your health care provider. Make sure you discuss any questions you have with your health care provider. Document Released: 11/14/2004 Document Revised: 09/19/2017 Document Reviewed: 11/12/2016 Elsevier Patient Education  2020 Elsevier Inc.  

## 2019-10-06 ENCOUNTER — Inpatient Hospital Stay: Payer: Medicaid Other | Admitting: Adult Health

## 2019-10-12 ENCOUNTER — Inpatient Hospital Stay: Payer: Medicaid Other | Admitting: Adult Health

## 2019-10-12 ENCOUNTER — Telehealth: Payer: Self-pay

## 2019-10-12 NOTE — Progress Notes (Deleted)
[facility] [address] [place]. [place] [address] [phone]       OFFICE FOLLOW-UP NOTE  Sarah. Sarah Bean Date of Birth:  20-Dec-1965 Medical Record Number:  [id number]   HPI:  Hospital admission 05/25/2017: Sarah Bean is a 49 year African-American lady seen today for follow-up for the first time after recent hospital admission for strokelike symptoms. She is accompanied by her son. History is obtained from the patient, review of electronic medical records and have personally reviewed imaging films.Sarah Shanker Brownis an 53 y.o.femalewith a history of hypertension, obesity and bipolar affective disorder presenting with numbness involving right side of her face and right upper extremity of sudden onset. Initial symptoms occurred yesterday and lasted for about 1 hour. Current symptoms started at 6:00 PM on 05/25/2017 and have persisted. She had slurred speech on 05/25/2017, which has resolved. Numbness has persisted.  The patient also reports vertigo that began about one week ago.  CT scan of her head showed no acute intracranial abnormality. She has not been on antiplatelet therapy daily. NIH stroke score was 1. LSN:1800 on 05/25/2017.Patient was not administered IV t-PA secondary to low NIHSS/minimal deficits on arrival. She was admitted   for further evaluation and treatment. CT scan of the head showed a small left occipital lobe nonhemorrhagic recent appearing infarct but patient was unable to tolerate MRI scan. Carotid Doppler showed no significant extracranial stenosis. Transthoracic echo showed normal ejection fraction without cardiac source of embolism. LDL cholesterol was elevated at 75 mg percent. Hemoglobin A1c was 5.5. Telemetry monitoring in the hospital did not show any cardiac arrhythmias.   07/15/17 visit Dr. Leonie Man: Patient had right-sided numbness which appears to have improved with a few days after discharge. She did not have any recurrent symptoms but does complain of  some seen spots in front of her eyes which are moving around more on the right than the left. This has started only 2 weeks ago. She denies any accompanying headache, double vision, she is denies any decreased vision acuity or visual fields and has not been bumping into objects. She has not seen an eye doctor but plans to see one soon. She is tolerating aspirin without bleeding or bruising. She is also on Lipitor 40 mg is tolerating well. She states her blood pressure is well controlled and today it is 116/78. She takes Requip for restless legs which also seems to be well controlled. She plans on eating healthy diet but has not yet lost any weight. I explained to the patient that she probably had a left posterior circulation PCA branch infarct and Ct scan was suggestive  but not conclusive and she was unable to tolerate MRI in the hospital. Repeating an open MRI images confirm the diagnosis but is unlikely to change the treatment plan hence we would not get an MRI as it might make her uncomfortable  04/07/18 UPDATE: Patient is being seen for follow-up.  She continues to complain of vision issues which she states have been worsening as her vision is blurry and " fungus".  She did undergo MRI on 09/09/2017 which showed chronic left occipital lobe infarcts but no other acute findings.  Patient has not schedule appointment with eye doctor since previous appointment as advised.  She does continue to take aspirin without side effects of bleeding or bruising.  Continues to take Lipitor without side effects myalgias.  Blood pressure today satisfactory 136/87.  Patient is followed by [facility] for depression/anxiety.  She currently is taking Seroquel,  trazodone and Prozac but does need a refill on all of these and is planning on going to the walk-in clinic.  Denies new or worsening stroke/TIA symptoms at this time.  Update 10/12/2019: Sarah Bean is a 53 year old female who is being seen today for stroke follow-up.  She  unfortunately presented to the ED on 08/20/2019 with dizziness.  She was evaluated by stroke team and Dr. Leonie Man with stroke work-up largely unremarkable and symptoms likely secondary to TIA.  Unable to obtain MRI as patient refused due to claustrophobia.  Recommended DAPT for 3 weeks then aspirin alone as she reported no antithrombotic prior to admission.  LDL 87 and recommended initiating atorvastatin 40 mg daily despite at prior visit she reports continued use.  Residual deficit ***.  She continues on aspirin and atorvastatin for secondary stroke prevention of side effects.  Blood pressure today ***.  Sleep apnea ***.  denies new or worsening stroke/TIA symptoms.     ROS:   14 system review of systems is positive for blurred vision, cough, wheezing, shortness of breath, restless leg, insomnia, joint pain, back pain, aching muscles, muscle cramps, rash, itching, dizziness, headache, agitation, depression, nervous/anxious, and all other systems negative  PMH:  Past Medical History:  Diagnosis Date   Asthma    Essential hypertension    Essential hypertension 05/26/2017   Insomnia    Manic depression (HCC)    Obesity    Restless leg syndrome    Stroke [facility])     Social History:  Social History   Socioeconomic History   Marital status: Single    Spouse name: Not on file   Number of children: Not on file   Years of education: Not on file   Highest education level: Not on file  Occupational History   Occupation: disabled  Tobacco Use   Smoking status: Never Smoker   Smokeless tobacco: Never Used  Substance and Sexual Activity   Alcohol use: No   Drug use: No   Sexual activity: Not on file  Other Topics Concern   Not on file  Social History Narrative   Patient lives at home with her son, who has autism. He vocalizes and acts out.   Social Determinants of Health   Financial Resource Strain:    Difficulty of Paying Living Expenses: Not on file  Food  Insecurity:    Worried About Charity fundraiser in the Last Year: Not on file   [employer] of Food in the Last Year: Not on file  Transportation Needs:    Lack of Transportation (Medical): Not on file   Lack of Transportation (Non-Medical): Not on file  Physical Activity:    Days of Exercise per Week: Not on file   Minutes of Exercise per Session: Not on file  Stress:    Feeling of Stress : Not on file  Social Connections:    Frequency of Communication with Friends and Family: Not on file   Frequency of Social Gatherings with Friends and Family: Not on file   Attends Religious Services: Not on file   Active Member of Clubs or Organizations: Not on file   Attends Archivist Meetings: Not on file   Marital Status: Not on file  Intimate Partner Violence:    Fear of Current or Ex-Partner: Not on file   Emotionally Abused: Not on file   Physically Abused: Not on file   Sexually Abused: Not on file    Medications:   Current Outpatient Medications  on File Prior to Visit  Medication Sig Dispense Refill   acetaminophen (TYLENOL) 500 MG tablet Take 500-1,000 mg by mouth every 8 (eight) hours as needed for mild pain or headache.     albuterol (PROVENTIL HFA;VENTOLIN HFA) 108 (90 BASE) MCG/ACT inhaler Inhale 1-2 puffs into the lungs every 4 (four) hours as needed for wheezing or shortness of breath. 1 Inhaler 0   APAP-Pamabrom-Pyrilamine (PAMPRIN MULTI-SYMPTOM) 500-25-15 MG TABS Take 1-2 tablets by mouth every 8 (eight) hours as needed (for cramping or discomfort).      atorvastatin (LIPITOR) 40 MG tablet Take 1 tablet (40 mg total) by mouth daily at 6 PM. 30 tablet 0   caffeine (VIVARIN) 200 MG TABS tablet Take 200 mg by mouth every 6 (six) hours as needed (for alertness).      Elastic Bandages & Supports (FUTURO SOFT CERVICAL COLLAR) MISC 1 Device by Does not apply route daily as needed. 1 each 0   ferrous sulfate 325 (65 FE) MG tablet Take 1 tablet (325 mg  total) by mouth daily. 30 tablet 0   ibuprofen (ADVIL) 200 MG tablet Take 400 mg by mouth every 6 (six) hours as needed for headache or mild pain.     lisinopril (ZESTRIL) 20 MG tablet Take 1 tablet (20 mg total) by mouth daily. 30 tablet 0   methocarbamol (ROBAXIN) 500 MG tablet Take 1 tablet (500 mg total) by mouth 3 (three) times daily as needed for muscle spasms. (Patient not taking: Reported on 08/19/2019) 20 tablet 0   QUEtiapine (SEROQUEL) 100 MG tablet Take 100 mg by mouth at bedtime.     rOPINIRole (REQUIP) 0.25 MG tablet Take 0.25 mg by mouth See admin instructions. Take 0.25 mg by mouth 1-3 hours before bedtime     traZODone (DESYREL) 100 MG tablet Take 100 mg by mouth at bedtime.     No current facility-administered medications on file prior to visit.    Allergies:  No Known Allergies  Physical Exam General: Obese middle-aged African-American lady seated, in no evident distress Head: head normocephalic and atraumatic.  Neck: supple with no carotid or supraclavicular bruits Cardiovascular: regular rate and rhythm, no murmurs Musculoskeletal: no deformity Skin:  no rash/petichiae Vascular:  Normal pulses all extremities There were no vitals filed for this visit. Neurologic Exam Mental Status: Awake and fully alert. Oriented to place and time. Recent and remote memory intact. Attention span, concentration and fund of knowledge appropriate. Mood and affect appropriate.  Cranial Nerves: Fundoscopic exam reveals sharp disc margins. Pupils equal, briskly reactive to light. Extraocular movements full without nystagmus. Visual fields full to confrontation. Hearing intact. Facial sensation intact. Face, tongue, palate moves normally and symmetrically.  Motor: Normal bulk and tone. Normal strength in all tested extremity muscles. Sensory.: intact to touch ,pinprick .position and vibratory sensation.  Coordination: Rapid alternating movements normal in all extremities. Finger-to-nose  and heel-to-shin performed accurately bilaterally. Gait and Station: Arises from chair without difficulty. Stance is normal. Gait demonstrates normal stride length and balance . Able to heel, toe and tandem walk without difficulty.  Reflexes: 1+ and symmetric. Toes downgoing.    IMAGING  CT HEAD WO CONTRAST 08/19/2019 IMPRESSION: 1. No acute intracranial abnormality. 2. Progression of left occipital lobe encephalomalacia. 3. ASPECTS is 10.  CT ANGIO NECK W OR WO CONTRAST CT ANGIO HEAD W OR WO CONTRAST 08/19/2019 IMPRESSION: Normal CTA of the head and neck.  CT HEAD WO CONTRAST 08/21/2019 IMPRESSION: Old left occipital infarct, unchanged, without acute intracranial  Abnormality.  2D Echocardiogram 1. Left ventricular ejection fraction, by visual estimation, is 65 to 70%. The left ventricle has normal function. There is no left ventricular hypertrophy. 2. Global right ventricle has normal systolic function.The right ventricular size is normal. No increase in right ventricular wall thickness. 3. Left atrial size was normal. 4. Right atrial size was normal. 5. The mitral valve is normal in structure. Trace mitral valve regurgitation. No evidence of mitral stenosis. 6. The tricuspid valve is normal in structure. Tricuspid valve regurgitation is mild. 7. The aortic valve is normal in structure. Aortic valve regurgitation is not visualized. No evidence of aortic valve sclerosis or stenosis. 8. The pulmonic valve was normal in structure. Pulmonic valve regurgitation is not visualized. 9. The inferior vena cava is normal in size with greater than 50% respiratory variability, suggesting right atrial pressure of 3 mmHg. 10. No source of intracardiac embolism.     ASSESSMENT: 53 year old lady with episode of dizziness x2 on 08/19/2019 likely TIA but patient refused MRI.  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  During visit  in 06/2017, complaints of visual issues and was able to undergo open MRI on 08/2017 which showed chronic left occipital lobe infarcts.  Vascular risk factors HTN, HLD, prior strokes, morbid obesity and possible OSA.    PLAN: -Continue aspirin 325 mg daily  and Lipitor for secondary stroke prevention -Advised patient to schedule appointment with ophthalmologist to assess patient complaints -Highly advised to follow-up with [facility] for depression/anxiety -F/u with PCP regarding your HLD and HTN management and continued prescribing -continue to monitor BP at home -Maintain strict control of hypertension with blood pressure goal below 130/90, diabetes with hemoglobin A1c goal below 6.5% and cholesterol with LDL cholesterol (bad cholesterol) goal below 70 mg/dL. I also advised the patient to eat a healthy diet with plenty of whole grains, cereals, fruits and vegetables, exercise regularly and maintain ideal body weight.  Follow up as needed or call earlier if needed   Greater than 50% of time during this 25 minute visit was spent on counseling,explanation of diagnosis of left posterior cerebral artery branch infarct, reviewing risk factor management of HLD and HTN, planning of further management, discussion with patient and family and coordination of care  Frann Rider, [facility]  [facility] [address] [place], [place] [address]  Phone [phone] Fax [phone]

## 2019-10-12 NOTE — Telephone Encounter (Signed)
Patient was a no call/no show for their appointment today.   

## 2019-10-13 ENCOUNTER — Encounter: Payer: Self-pay | Admitting: Adult Health

## 2019-11-17 ENCOUNTER — Inpatient Hospital Stay: Payer: Medicaid Other | Admitting: Adult Health

## 2019-11-29 ENCOUNTER — Other Ambulatory Visit: Payer: Self-pay

## 2019-11-29 ENCOUNTER — Ambulatory Visit: Payer: Medicaid Other | Admitting: Adult Health

## 2019-11-29 ENCOUNTER — Encounter: Payer: Self-pay | Admitting: Adult Health

## 2019-11-29 VITALS — BP 130/64 | HR 93 | Temp 97.4°F | Ht 64.0 in | Wt 239.8 lb

## 2019-11-29 DIAGNOSIS — E785 Hyperlipidemia, unspecified: Secondary | ICD-10-CM

## 2019-11-29 DIAGNOSIS — I10 Essential (primary) hypertension: Secondary | ICD-10-CM

## 2019-11-29 DIAGNOSIS — R29818 Other symptoms and signs involving the nervous system: Secondary | ICD-10-CM

## 2019-11-29 DIAGNOSIS — G459 Transient cerebral ischemic attack, unspecified: Secondary | ICD-10-CM | POA: Diagnosis not present

## 2019-11-29 DIAGNOSIS — Z8673 Personal history of transient ischemic attack (TIA), and cerebral infarction without residual deficits: Secondary | ICD-10-CM

## 2019-11-29 NOTE — Progress Notes (Signed)
[facility] [address] [place]. [place] [address] [phone]       HOSPITAL FOLLOW-UP NOTE  [name] Date of Birth:  [birth year] Medical Record Number:  [id number]    Chief Complaint  Patient presents with  . Hospitalization Follow-up    Alone. Treatment room. No new concerns at this time.      HPI:   Stroke admission 08/19/2019: [name] is a 54 y.o. female with history of morbid obestiy, HTN, RLS, prior stroke/TIA  who presented on 08/19/2019 with 2 episodes of dizziness over the past 24 hours and gait ataxia.  Evaluated by stroke team and Dr. Leonie Bean with symptoms likely secondary to posterior circulation TIA with initial CT head no acute abnormality as well as repeat CT head unremarkable.  CT head did show remote history of silent left occipital infarct which was demonstrated on prior imaging.  Refused MRI due to claustrophobia.  CTA head/neck unremarkable.  2D echo showed an EF of 65 to 70% without cardiac source of embolus identified.  Recommended DAPT for 3 weeks then aspirin alone as not previously on antithrombotic.  HTN stable.  LDL 87 and initiated atorvastatin 40 mg daily.  No history or evidence of DM with A1c 5.4.  Other stroke risk factors include morbid obesity, prior history of stroke 05/2017 suspect a left subcortical infarct and suspected sleep apnea.  Other active problems include depression with anxiety and RLS.  She was discharged home with recommendation of home health PT/OT.  Since discharge, she has been doing well without reoccurring symptoms.  She has returned back to all prior activities.  Completed 3 weeks DAPT and continues on aspirin alone without bleeding or bruising.  Continues on atorvastatin 40mg  daily without myalgias.  Blood pressure today 130/64.  Discussion regarding suspected sleep apnea with occasional morning headaches, occasional insomnia, daytime fatigue and snoring.  She states she typically takes caffeine pills  during the day due to fatigue that interferes with daily activity.  She has not previously underwent sleep study.  Continues to follow with [facility] for underlying depression/anxiety.  Denies new or worsening stroke/TIA symptoms.     Stroke admission 05/25/2017: [name] is a 56 year African-American lady seen today for follow-up for the first time after recent hospital admission for strokelike symptoms. She is accompanied by her son. History is obtained from the patient, review of electronic medical records and have personally reviewed imaging films.Sarah Mesker Brownis an 54 y.o.femalewith a history of hypertension, obesity and bipolar affective disorder presenting with numbness involving right side of her face and right upper extremity of sudden onset. Initial symptoms occurred yesterday and lasted for about 1 hour. Current symptoms started at 6:00 PM on 05/25/2017 and have persisted. She had slurred speech on 05/25/2017, which has resolved. Numbness has persisted.  The patient also reports vertigo that began about one week ago.  CT scan of her head showed no acute intracranial abnormality. She has not been on antiplatelet therapy daily. NIH stroke score was 1. LSN:1800 on 05/25/2017.Patient was not administered IV t-PA secondary to low NIHSS/minimal deficits on arrival. She was admitted   for further evaluation and treatment. CT scan of the head showed a small left occipital lobe nonhemorrhagic recent appearing infarct but patient was unable to tolerate MRI scan. Carotid Doppler showed no significant extracranial stenosis. Transthoracic echo showed normal ejection fraction without cardiac source of embolism. LDL cholesterol was elevated at 75 mg percent. Hemoglobin A1c was 5.5. Telemetry monitoring in  the hospital did not show any cardiac arrhythmias.   07/15/17 visit Dr. Leonie Bean: Patient had right-sided numbness which appears to have improved with a few days after discharge. She did not have any recurrent symptoms  but does complain of some seen spots in front of her eyes which are moving around more on the right than the left. This has started only 2 weeks ago. She denies any accompanying headache, double vision, she is denies any decreased vision acuity or visual fields and has not been bumping into objects. She has not seen an eye doctor but plans to see one soon. She is tolerating aspirin without bleeding or bruising. She is also on Lipitor 40 mg is tolerating well. She states her blood pressure is well controlled and today it is 116/78. She takes Requip for restless legs which also seems to be well controlled. She plans on eating healthy diet but has not yet lost any weight. I explained to the patient that she probably had a left posterior circulation PCA branch infarct and Ct scan was suggestive  but not conclusive and she was unable to tolerate MRI in the hospital. Repeating an open MRI images confirm the diagnosis but is unlikely to change the treatment plan hence we would not get an MRI as it might make her uncomfortable  04/07/18 UPDATE: Patient is being seen for follow-up.  She continues to complain of vision issues which she states have been worsening as her vision is blurry and " fungus".  She did undergo MRI on 09/09/2017 which showed chronic left occipital lobe infarcts but no other acute findings.  Patient has not schedule appointment with eye doctor since previous appointment as advised.  She does continue to take aspirin without side effects of bleeding or bruising.  Continues to take Lipitor without side effects myalgias.  Blood pressure today satisfactory 136/87.  Patient is followed by [facility] for depression/anxiety.  She currently is taking Seroquel, trazodone and Prozac but does need a refill on all of these and is planning on going to the walk-in clinic.  Denies new or worsening stroke/TIA symptoms at this time.   ROS:   14 system review of systems is positive for restless leg, insomnia, joint  pain, headache, agitation, depression, nervous/anxious, and all other systems negative  PMH:  Past Medical History:  Diagnosis Date  . Asthma   . Essential hypertension   . Essential hypertension 05/26/2017  . Insomnia   . Manic depression ([place])   . Obesity   . Restless leg syndrome   . Stroke [facility])     Social History:  Social History   Socioeconomic History  . Marital status: Single    Spouse name: Not on file  . Number of children: Not on file  . Years of education: Not on file  . Highest education level: Not on file  Occupational History  . Occupation: disabled  Tobacco Use  . Smoking status: Never Smoker  . Smokeless tobacco: Never Used  Substance and Sexual Activity  . Alcohol use: No  . Drug use: No  . Sexual activity: Not on file  Other Topics Concern  . Not on file  Social History Narrative   Patient lives at home with her son, who has autism. He vocalizes and acts out.   Social Determinants of Health   Financial Resource Strain:   . Difficulty of Paying Living Expenses: Not on file  Food Insecurity:   . Worried About Charity fundraiser in the Last Year:  Not on file  . Ran Out of Food in the Last Year: Not on file  Transportation Needs:   . Lack of Transportation (Medical): Not on file  . Lack of Transportation (Non-Medical): Not on file  Physical Activity:   . Days of Exercise per Week: Not on file  . Minutes of Exercise per Session: Not on file  Stress:   . Feeling of Stress : Not on file  Social Connections:   . Frequency of Communication with Friends and Family: Not on file  . Frequency of Social Gatherings with Friends and Family: Not on file  . Attends Religious Services: Not on file  . Active Member of Clubs or Organizations: Not on file  . Attends Archivist Meetings: Not on file  . Marital Status: Not on file  Intimate Partner Violence:   . Fear of Current or Ex-Partner: Not on file  . Emotionally Abused: Not on file  .  Physically Abused: Not on file  . Sexually Abused: Not on file    Medications:   Current Outpatient Medications on File Prior to Visit  Medication Sig Dispense Refill  . acetaminophen (TYLENOL) 500 MG tablet Take 500-1,000 mg by mouth every 8 (eight) hours as needed for mild pain or headache.    . albuterol (PROVENTIL HFA;VENTOLIN HFA) 108 (90 BASE) MCG/ACT inhaler Inhale 1-2 puffs into the lungs every 4 (four) hours as needed for wheezing or shortness of breath. 1 Inhaler 0  . APAP-Pamabrom-Pyrilamine (PAMPRIN MULTI-SYMPTOM) 500-25-15 MG TABS Take 1-2 tablets by mouth every 8 (eight) hours as needed (for cramping or discomfort).     Marland Kitchen atorvastatin (LIPITOR) 40 MG tablet Take 1 tablet (40 mg total) by mouth daily at 6 PM. 30 tablet 0  . caffeine (VIVARIN) 200 MG TABS tablet Take 200 mg by mouth every 6 (six) hours as needed (for alertness).     Water engineer Bandages & Supports (FUTURO SOFT CERVICAL COLLAR) MISC 1 Device by Does not apply route daily as needed. 1 each 0  . ferrous sulfate 325 (65 FE) MG tablet Take 1 tablet (325 mg total) by mouth daily. 30 tablet 0  . ibuprofen (ADVIL) 200 MG tablet Take 400 mg by mouth every 6 (six) hours as needed for headache or mild pain.    Marland Kitchen lisinopril (ZESTRIL) 20 MG tablet Take 1 tablet (20 mg total) by mouth daily. 30 tablet 0  . methocarbamol (ROBAXIN) 500 MG tablet Take 1 tablet (500 mg total) by mouth 3 (three) times daily as needed for muscle spasms. 20 tablet 0  . QUEtiapine (SEROQUEL) 100 MG tablet Take 100 mg by mouth at bedtime.    Marland Kitchen rOPINIRole (REQUIP) 0.25 MG tablet Take 0.25 mg by mouth See admin instructions. Take 0.25 mg by mouth 1-3 hours before bedtime    . sertraline (ZOLOFT) 50 MG tablet Take 75 mg by mouth daily.    . traZODone (DESYREL) 100 MG tablet Take 100 mg by mouth at bedtime.     No current facility-administered medications on file prior to visit.    Allergies:  No Known Allergies  Physical Exam  Today's Vitals   11/29/19  1402  BP: 130/64  Pulse: 93  Temp: (!) 97.4 F (36.3 C)  TempSrc: Oral  Weight: 239 lb 12.8 oz (108.8 kg)  Height: 5\' 4"  (1.626 m)   Body mass index is 41.16 kg/m.   General: Obese pleasant middle-aged African-American lady seated, in no evident distress Head: head normocephalic and atraumatic.  Neck: supple with no carotid or supraclavicular bruits Cardiovascular: regular rate and rhythm, no murmurs Musculoskeletal: no deformity Skin:  no rash/petichiae Vascular:  Normal pulses all extremities  Neurologic Exam Mental Status: Awake and fully alert.  Normal speech and language.  Oriented to place and time. Recent and remote memory intact. Attention span, concentration and fund of knowledge appropriate. Mood and affect appropriate.  Cranial Nerves: Fundoscopic exam reveals sharp disc margins. Pupils equal, briskly reactive to light. Extraocular movements full without nystagmus. Visual fields full to confrontation. Hearing intact. Facial sensation intact. Face, tongue, palate moves normally and symmetrically.  Motor: Normal bulk and tone. Normal strength in all tested extremity muscles. Sensory.: intact to touch ,pinprick .position and vibratory sensation.  Coordination: Rapid alternating movements normal in all extremities. Finger-to-nose and heel-to-shin performed accurately bilaterally. Gait and Station: Arises from chair without difficulty. Stance is normal. Gait demonstrates normal stride length and balance . Able to heel, toe and tandem walk without difficulty.  Reflexes: 1+ and symmetric. Toes downgoing.    IMAGING  CT HEAD WO CONTRAST 08/19/2019 IMPRESSION: 1. No acute intracranial abnormality. 2. Progression of left occipital lobe encephalomalacia. 3. ASPECTS is 10.  08/21/2019 IMPRESSION: Old left occipital infarct, unchanged, without acute intracranial Abnormality.  CT ANGIO NECK W OR WO CONTRAST CT ANGIO HEAD W OR WO CONTRAST 08/19/2019 IMPRESSION: Normal CTA  of the head and neck.    ASSESSMENT: 54 year old lady with episode of dizziness and gait ataxia on 08/19/2019 likely due to posterior circulation TIA with prior history of strokelike episode 05/2017 with sudden onset right-sided paresthesias.  Unable to obtain MRI due to severe claustrophobia.  Vascular risk factors include HTN, HLD, recurrent stroke/TIA, morbid obesity and suspected sleep apnea.  She has been doing well since discharge with no new or reoccurring stroke/TIA symptoms.  Concern for sleep apnea based on reported occasional morning headaches, insomnia, daytime fatigue and snoring    PLAN: -Continue aspirin 81 mg daily  and Lipitor for secondary stroke prevention -discussion regarding ongoing compliance lifelong for secondary stroke prevention -Referral placed to [place] sleep clinic for evaluation of suspected sleep apnea -Continue to follow-up with [facility] for depression/anxiety -F/u with PCP regarding your HLD and HTN management and monitoring -continue to monitor BP at home -Maintain strict control of hypertension with blood pressure goal below 130/90, diabetes with hemoglobin A1c goal below 6.5% and cholesterol with LDL cholesterol (bad cholesterol) goal below 70 mg/dL. I also advised the patient to eat a healthy diet with plenty of whole grains, cereals, fruits and vegetables, exercise regularly and maintain ideal body weight.  Follow-up in 4 months or call earlier if needed  Greater than 50% of time during this 35 minute visit was spent on counseling, discussion regarding recent TIA with prior history of stroke/TIA as well as silent occipital infarct on imaging, discussion regarding suspected sleep apnea requiring further evaluation and education regarding increased risk with untreated sleep apnea, reviewing risk factor management of HLD and HTN, planning of further management, discussion with patient answering all questions to [place], [facility]  [facility] [address] [place], [place] [address]  Phone [phone] Fax [phone]

## 2019-11-29 NOTE — Patient Instructions (Signed)
Continue aspirin 81 mg daily  and lipitor  for secondary stroke prevention  Continue to follow up with PCP regarding cholesterol and blood pressure management   Continue to follow with [facility] for depression/anxiety symptoms  Referral placed to [place] [facility] for evaluation of sleep apnea - you will be called to scheduled initial consult  Continue to monitor blood pressure at home  Maintain strict control of hypertension with blood pressure goal below 130/90, diabetes with hemoglobin A1c goal below 6.5% and cholesterol with LDL cholesterol (bad cholesterol) goal below 70 mg/dL. I also advised the patient to eat a healthy diet with plenty of whole grains, cereals, fruits and vegetables, exercise regularly and maintain ideal body weight.  Followup in the future with me in 4 months or call earlier if needed       Thank you for coming to see [place] at [facility]. I hope we have been able to provide you high quality care today.  You may receive a patient satisfaction survey over the next few weeks. We would appreciate your feedback and comments so that we may continue to improve ourselves and the health of our patients.

## 2019-11-30 NOTE — Progress Notes (Signed)
I agree with the above plan 

## 2019-12-07 ENCOUNTER — Other Ambulatory Visit: Payer: Self-pay

## 2019-12-07 ENCOUNTER — Ambulatory Visit: Payer: Medicaid Other | Admitting: Neurology

## 2019-12-07 ENCOUNTER — Encounter: Payer: Self-pay | Admitting: Neurology

## 2019-12-07 VITALS — BP 142/84 | HR 74 | Temp 97.1°F | Ht 64.0 in | Wt 236.0 lb

## 2019-12-07 DIAGNOSIS — R0683 Snoring: Secondary | ICD-10-CM

## 2019-12-07 DIAGNOSIS — Z6841 Body Mass Index (BMI) 40.0 and over, adult: Secondary | ICD-10-CM

## 2019-12-07 DIAGNOSIS — R519 Headache, unspecified: Secondary | ICD-10-CM

## 2019-12-07 DIAGNOSIS — G4719 Other hypersomnia: Secondary | ICD-10-CM | POA: Diagnosis not present

## 2019-12-07 DIAGNOSIS — R351 Nocturia: Secondary | ICD-10-CM

## 2019-12-07 DIAGNOSIS — Z8673 Personal history of transient ischemic attack (TIA), and cerebral infarction without residual deficits: Secondary | ICD-10-CM | POA: Diagnosis not present

## 2019-12-07 NOTE — Progress Notes (Signed)
Subjective:    Patient ID: Sarah Bean is a 54 y.o. female.  HPI     Star Age, MD, PhD [facility] [address], [address]O. [place], [place] [address]  Dear Sarah Bean, I saw your patient, Sarah Bean, upon your kind request, sleep clinic today for initial consultation of her sleep disorder, in particular, concern for underlying obstructive sleep apnea.  The patient is unaccompanied today.  As you know, Sarah Bean is a 54 year old right-handed woman with an underlying medical history of stroke, TIA, mood disorder, restless leg syndrome, hypertension, asthma, and morbid obesity with a BMI of over 40, who reports snoring and excessive daytime somnolence.  I reviewed your office note from 11/29/2019.  Her Epworth sleepiness score is 17 out of 24, fatigue severity score is 57 out of 63.  She tries to get 8 hours of sleep.  She is often in bed by 8 and rise time is around 6 but she is up earlier than that on some days.  She has nocturia about once per average night and has had morning headaches.  She has woken up with a sense of gasping and short of breath.  She does not have a family history of sleep apnea but her daughter is having symptoms as I understand.  The patient lives alone, she has 1 son and 2 daughters, her boyfriend has complained about her snoring.  She is a non-smoker and does not drink alcohol, she drinks caffeine in the form of coffee, 2 cups/day on average.   Her Past Medical History Is Significant For: Past Medical History:  Diagnosis Date  . Asthma   . Essential hypertension   . Essential hypertension 05/26/2017  . Insomnia   . Manic depression ([place])   . Obesity   . Restless leg syndrome   . Stroke [facility])     Her Past Surgical History Is Significant For: Past Surgical History:  Procedure Laterality Date  . BREAST LUMPECTOMY Left   . CESAREAN SECTION     x3    Her Family History Is Significant For: Family History  Problem Relation Age  of Onset  . CAD Mother 18  . Other Father 16       struck by lightning  . Stroke Neg Hx     Her Social History Is Significant For: Social History   Socioeconomic History  . Marital status: Single    Spouse name: Not on file  . Number of children: Not on file  . Years of education: Not on file  . Highest education level: Not on file  Occupational History  . Occupation: disabled  Tobacco Use  . Smoking status: Never Smoker  . Smokeless tobacco: Never Used  Substance and Sexual Activity  . Alcohol use: No  . Drug use: No  . Sexual activity: Not on file  Other Topics Concern  . Not on file  Social History Narrative   Patient lives at home with her son, who has autism. He vocalizes and acts out.   Social Determinants of Health   Financial Resource Strain:   . Difficulty of Paying Living Expenses: Not on file  Food Insecurity:   . Worried About Charity fundraiser in the Last Year: Not on file  . Ran Out of Food in the Last Year: Not on file  Transportation Needs:   . Lack of Transportation (Medical): Not on file  . Lack of Transportation (Non-Medical): Not on file  Physical Activity:   .  Days of Exercise per Week: Not on file  . Minutes of Exercise per Session: Not on file  Stress:   . Feeling of Stress : Not on file  Social Connections:   . Frequency of Communication with Friends and Family: Not on file  . Frequency of Social Gatherings with Friends and Family: Not on file  . Attends Religious Services: Not on file  . Active Member of Clubs or Organizations: Not on file  . Attends Archivist Meetings: Not on file  . Marital Status: Not on file    Her Allergies Are:  No Known Allergies:   Her Current Medications Are:  Outpatient Encounter Medications as of 12/07/2019  Medication Sig  . acetaminophen (TYLENOL) 500 MG tablet Take 500-1,000 mg by mouth every 8 (eight) hours as needed for mild pain or headache.  . albuterol (PROVENTIL HFA;VENTOLIN HFA) 108  (90 BASE) MCG/ACT inhaler Inhale 1-2 puffs into the lungs every 4 (four) hours as needed for wheezing or shortness of breath.  Marland Kitchen APAP-Pamabrom-Pyrilamine (PAMPRIN MULTI-SYMPTOM) 500-25-15 MG TABS Take 1-2 tablets by mouth every 8 (eight) hours as needed (for cramping or discomfort).   Marland Kitchen aspirin EC 81 MG tablet Take 81 mg by mouth daily.  . caffeine (VIVARIN) 200 MG TABS tablet Take 200 mg by mouth every 6 (six) hours as needed (for alertness).   Water engineer Bandages & Supports (FUTURO SOFT CERVICAL COLLAR) MISC 1 Device by Does not apply route daily as needed.  . ferrous sulfate 325 (65 FE) MG tablet Take 1 tablet (325 mg total) by mouth daily.  Marland Kitchen ibuprofen (ADVIL) 200 MG tablet Take 400 mg by mouth every 6 (six) hours as needed for headache or mild pain.  . methocarbamol (ROBAXIN) 500 MG tablet Take 1 tablet (500 mg total) by mouth 3 (three) times daily as needed for muscle spasms.  Marland Kitchen QUEtiapine (SEROQUEL) 100 MG tablet Take 100 mg by mouth at bedtime.  Marland Kitchen rOPINIRole (REQUIP) 0.25 MG tablet Take 0.25 mg by mouth See admin instructions. Take 0.25 mg by mouth 1-3 hours before bedtime  . sertraline (ZOLOFT) 50 MG tablet Take 75 mg by mouth daily.  . traZODone (DESYREL) 100 MG tablet Take 100 mg by mouth at bedtime.  Marland Kitchen atorvastatin (LIPITOR) 40 MG tablet Take 1 tablet (40 mg total) by mouth daily at 6 PM.  . lisinopril (ZESTRIL) 20 MG tablet Take 1 tablet (20 mg total) by mouth daily.   No facility-administered encounter medications on file as of 12/07/2019.  :  Review of Systems:  Out of a complete 14 point review of systems, all are reviewed and negative with the exception of these symptoms as listed below: Review of Systems  Neurological:       Here for sleep consult. No prior sleep study.  Epworth Sleepiness Scale 0= would never doze 1= slight chance of dozing 2= moderate chance of dozing 3= high chance of dozing  Sitting and reading: 3 Watching TV:3 Sitting inactive in a public place  (ex. Theater or meeting):3 As a passenger in a car for an hour without a break:2 Lying down to rest in the afternoon:1 Sitting and talking to someone:3 Sitting quietly after lunch (no alcohol):1 In a car, while stopped in traffic:1 Total:17     Objective:  Neurological Exam  Physical Exam Physical Examination:   Vitals:   12/07/19 1110  BP: (!) 142/84  Pulse: 74  Temp: (!) 97.1 F (36.2 C)    General Examination: The patient is  a very pleasant 54 y.o. female in no acute distress. She appears well-developed and well-nourished and well groomed.   HEENT: Normocephalic, atraumatic, pupils are equal, round and reactive to light. Extraocular tracking is good without limitation to gaze excursion or nystagmus noted. Normal smooth pursuit is noted. Hearing is grossly intact. Face is symmetric with normal facial animation and normal facial sensation. Speech is clear with no dysarthria noted. There is no hypophonia. There is no lip, neck/head, jaw or voice tremor. Neck is supple with full range of passive and active motion. There are no carotid bruits on auscultation. Oropharynx exam reveals: moderate mouth dryness, adequate dental hygiene and moderate airway crowding, due to Smaller airway entry and redundant soft palate, wider tongue, tonsils not fully visualized, Mallampati class III, neck circumference of 17 inches.  She has a moderate overbite.  Tongue protrudes centrally in palate elevates symmetrically.   Chest: Clear to auscultation without wheezing, rhonchi or crackles noted.  Heart: S1+S2+0, regular and normal without murmurs, rubs or gallops noted.   Abdomen: Soft, non-tender and non-distended with normal bowel sounds appreciated on auscultation.  Extremities: There is no pitting edema in the distal lower extremities bilaterally. Pedal pulses are intact.  Skin: Warm and dry without trophic changes noted.  Musculoskeletal: exam reveals no obvious joint deformities, tenderness or  joint swelling or erythema.   Neurologically:  Mental status: The patient is awake, alert and oriented in all 4 spheres. Her immediate and remote memory, attention, language skills and fund of knowledge are appropriate. There is no evidence of aphasia, agnosia, apraxia or anomia. Speech is clear with normal prosody and enunciation. Thought process is linear. Mood is normal and affect is normal.  Cranial nerves II - XII are as described above under HEENT exam. In addition: shoulder shrug is normal with equal shoulder height noted. Motor exam: Normal bulk, strength and tone is noted. There is no drift, tremor or rebound. Romberg is negative. Fine motor skills and coordination: grossly intact. Cerebellar testing: No dysmetria or intention tremor on finger to nose testing. Heel to shin is unremarkable bilaterally. There is no truncal or gait ataxia.  Sensory exam: intact to light touch. Gait, station and balance: She stands easily. No veering to one side is noted. No leaning to one side is noted. Posture is age-appropriate and stance is narrow based. Gait shows normal stride length and normal pace. No problems turning are noted. Slight limp on the R.  Assessment and plan:  In summary, Sarah Bean is a very pleasant 54 y.o.-year old female with an underlying medical history of stroke, TIA, mood disorder, restless leg syndrome, hypertension, asthma, and morbid obesity with a BMI of over 40, whose history and physical exam are concerning for obstructive sleep apnea (OSA). I had a long chat with the patient about my findings and the diagnosis of OSA, its prognosis and treatment options. We talked about medical treatments, surgical interventions and non-pharmacological approaches. I explained in particular the risks and ramifications of untreated moderate to severe OSA, especially with respect to developing cardiovascular disease down the Road, including congestive heart failure, difficult to treat  hypertension, cardiac arrhythmias, or stroke. Even type 2 diabetes has, in part, been linked to untreated OSA. Symptoms of untreated OSA include daytime sleepiness, memory problems, mood irritability and mood disorder such as depression and anxiety, lack of energy, as well as recurrent headaches, especially morning headaches. We talked about trying to maintain a healthy lifestyle in general, as well as the importance of  weight control. We also talked about the importance of good sleep hygiene. I recommended the following at this time: sleep study.  I explained the sleep test procedure to the patient and also outlined possible surgical and non-surgical treatment options of OSA, including the use of a custom-made dental device (which would require a referral to a specialist dentist or oral surgeon), upper airway surgical options (which would involve a referral to an ENT surgeon). I also explained the CPAP treatment option to the patient, who indicated that she would be willing to try CPAP if the need arises. I explained the importance of being compliant with PAP treatment, not only for insurance purposes but primarily to improve Her symptoms, and for the patient's long term health benefit, including to reduce Her cardiovascular risks. I answered all her questions today and the patient was in agreement. I plan to see her back after the sleep study is completed and encouraged her to call with any interim questions, concerns, problems or updates.   Thank you very much for allowing me to participate in the care of this nice patient. If I can be of any further assistance to you please do not hesitate to talk to me. Sincerely,   Star Age, MD, PhD

## 2019-12-07 NOTE — Patient Instructions (Signed)

## 2019-12-30 ENCOUNTER — Ambulatory Visit (INDEPENDENT_AMBULATORY_CARE_PROVIDER_SITE_OTHER): Payer: Medicaid Other | Admitting: Neurology

## 2019-12-30 ENCOUNTER — Other Ambulatory Visit: Payer: Self-pay

## 2019-12-30 DIAGNOSIS — G4733 Obstructive sleep apnea (adult) (pediatric): Secondary | ICD-10-CM

## 2019-12-30 DIAGNOSIS — R351 Nocturia: Secondary | ICD-10-CM

## 2019-12-30 DIAGNOSIS — G4719 Other hypersomnia: Secondary | ICD-10-CM

## 2019-12-30 DIAGNOSIS — R519 Headache, unspecified: Secondary | ICD-10-CM

## 2019-12-30 DIAGNOSIS — Z8673 Personal history of transient ischemic attack (TIA), and cerebral infarction without residual deficits: Secondary | ICD-10-CM

## 2019-12-30 DIAGNOSIS — R0683 Snoring: Secondary | ICD-10-CM

## 2019-12-30 DIAGNOSIS — G4720 Circadian rhythm sleep disorder, unspecified type: Secondary | ICD-10-CM

## 2019-12-30 DIAGNOSIS — G4761 Periodic limb movement disorder: Secondary | ICD-10-CM

## 2020-01-11 ENCOUNTER — Telehealth: Payer: Self-pay

## 2020-01-11 NOTE — Telephone Encounter (Signed)

## 2020-01-11 NOTE — Telephone Encounter (Signed)
I called pt to discuss. No answer, left a message asking her to call me back. 

## 2020-01-11 NOTE — Telephone Encounter (Signed)
-----   Message from Star Age, MD sent at 01/11/2020  7:23 AM EDT ----- Patient referred by Frann Rider, seen by me on 12/07/19, diagnostic PSG on 12/30/19.   Please call and notify the patient that the recent sleep study showed severe (with a total AHI of 31/hour, and O2 nadir of 76%). obstructive sleep apnea. I recommend treatment for this in the form of CPAP. This will require a repeat sleep study for proper titration and mask fitting and correct monitoring of the oxygen saturations. Please explain to patient. I have placed an order in the chart. Thanks.  Star Age, MD, PhD [facility])

## 2020-01-11 NOTE — Addendum Note (Signed)
Addended by: Star Age on: 01/11/2020 07:23 AM   Modules accepted: Orders

## 2020-01-11 NOTE — Progress Notes (Signed)
Patient referred by Frann Rider, seen by me on 12/07/19, diagnostic PSG on 12/30/19.   Please call and notify the patient that the recent sleep study showed severe (with a total AHI of 31/hour, and O2 nadir of 76%). obstructive sleep apnea. I recommend treatment for this in the form of CPAP. This will require a repeat sleep study for proper titration and mask fitting and correct monitoring of the oxygen saturations. Please explain to patient. I have placed an order in the chart. Thanks.  Star Age, MD, PhD [facility])

## 2020-01-11 NOTE — Procedures (Signed)
PATIENT'S NAME:  Sarah Bean, Sarah Bean DOB:      1966/02/16      MR#:    [id number]     DATE OF RECORDING: 12/30/2019 REFERRING M.D.:  Frann Rider, NP Study Performed:   Baseline Polysomnogram HISTORY: 54 year old woman with a history of stroke, TIA, mood disorder, restless leg syndrome, hypertension, asthma, and morbid obesity with a BMI of over 40, who reports snoring and excessive daytime somnolence. Her Epworth sleepiness score is 17 out of 24, fatigue severity score is 57 out of 63. The patient's weight 236 pounds with a height of 65 (inches), resulting in a BMI of 39.3 kg/m2. The patient's neck circumference measured 17 inches.  CURRENT MEDICATIONS: Tylenol, Proventil, Pamprin, Lipitor, Vivarin, ferrous sulfate, Advil, Zestril, Robaxin, Seroquel, Requip, Zoloft, Desyrel   PROCEDURE:  This is a multichannel digital polysomnogram utilizing the Somnostar 11.2 system.  Electrodes and sensors were applied and monitored per AASM Specifications.   EEG, EOG, Chin and Limb EMG, were sampled at 200 Hz.  ECG, Snore and Nasal Pressure, Thermal Airflow, Respiratory Effort, CPAP Flow and Pressure, Oximetry was sampled at 50 Hz. Digital video and audio were recorded.      BASELINE STUDY  Lights Out was at 21:38 and Lights On at 04:59.  Total recording time (TRT) was 441.5 minutes, with a total sleep time (TST) of 273 minutes.   The patient's sleep latency was 97.5 minutes, which is markedly delayed.  REM latency was 197.5 minutes, which is delayed. The sleep efficiency was 61.8%, which is reduced.     SLEEP ARCHITECTURE: WASO (Wake after sleep onset) was 73.5 minutes with one longer period of wakefulness and otherwise mild sleep fragmentation noted. There were 11.5 minutes in Stage N1, 215 minutes Stage N2, 4.5 minutes Stage N3 and 42 minutes in Stage REM.  The percentage of Stage N1 was 4.2%, Stage N2 was 78.8%, which is markedly increased, Stage N3 was 1.6% and Stage R (REM sleep) was 15.4%, which is reduced. The  arousals were noted as: 26 were spontaneous, 12 were associated with PLMs, 78 were associated with respiratory events.  RESPIRATORY ANALYSIS:  There were a total of 141 respiratory events:  45 obstructive apneas, 1 central apneas and 0 mixed apneas with a total of 46 apneas and an apnea index (AI) of 10.1 /hour. There were 95 hypopneas with a hypopnea index of 20.9 /hour. The patient also had 0 respiratory event related arousals (RERAs).      The total APNEA/HYPOPNEA INDEX (AHI) was 31./hour and the total RESPIRATORY DISTURBANCE INDEX was  31. /hour.  19 events occurred in REM sleep and 161 events in NREM. The REM AHI was  27.1 /hour, versus a non-REM AHI of 31.7. The patient spent 12 minutes of total sleep time in the supine position and 261 minutes in non-supine.. The supine AHI was 10.0 versus a non-supine AHI of 32.0.  OXYGEN SATURATION & C02:  The Wake baseline 02 saturation was 95%, with the lowest being 76%. Time spent below 89% saturation equaled 40 minutes.  PERIODIC LIMB MOVEMENTS: The patient had a total of 154 Periodic Limb Movements.  The Periodic Limb Movement (PLM) index was 33.8 and the PLM Arousal index was 2.6/hour.  Audio and video analysis did not show any abnormal or unusual movements, behaviors, phonations or vocalizations. The patient took 1 bathroom break. Mild snoring was noted. The EKG was in keeping with normal sinus rhythm (NSR). Post-study, the patient indicated that sleep was better than usual.   IMPRESSION:  1. Obstructive Sleep Apnea (OSA) 2. Periodic Limb Movement Disorder (PLMD) 3. Dysfunctions associated with sleep stages or arousal from sleep  RECOMMENDATIONS: 1. This study demonstrates severe obstructive sleep apnea, with a total AHI of 31/hour, and O2 nadir of 76%. Treatment with positive airway pressure in the form of CPAP is recommended. This will require a full night titration study to optimize therapy. Other treatment options may include avoidance of supine  sleep position along with weight loss, upper airway or jaw surgery in selected patients or the use of an oral appliance in certain patients. ENT evaluation and/or consultation with a maxillofacial surgeon or dentist may be feasible in some instances.    2. Please note that untreated obstructive sleep apnea may carry additional perioperative morbidity. Patients with significant obstructive sleep apnea should receive perioperative PAP therapy and the surgeons and particularly the anesthesiologist should be informed of the diagnosis and the severity of the sleep disordered breathing. 3. Moderate PLMs (periodic limb movements of sleep) were noted during this study with no significant arousals; clinical correlation is recommended. Medication effect from the antidepressant medication should be considered.  4. This study shows sleep fragmentation and abnormal sleep stage percentages; these are nonspecific findings and per se do not signify an intrinsic sleep disorder or a cause for the patient's sleep-related symptoms. Causes include (but are not limited to) the first night effect of the sleep study, circadian rhythm disturbances, medication effect or an underlying mood disorder or medical problem.  5. The patient should be cautioned not to drive, work at heights, or operate dangerous or heavy equipment when tired or sleepy. Review and reiteration of good sleep hygiene measures should be pursued with any patient. 6. The patient will be seen in follow-up in the sleep clinic at [facility] for discussion of the test results, symptom and treatment compliance review, further management strategies, etc. The referring provider will be notified of the test results.  I certify that I have reviewed the entire raw data recording prior to the issuance of this report in accordance with the Standards of Accreditation of the American Academy of Sleep Medicine (AASM)   Star Age, MD, PhD Diplomat, American Board of Neurology and  Sleep Medicine (Neurology and Sleep Medicine)

## 2020-01-18 ENCOUNTER — Telehealth: Payer: Self-pay

## 2020-01-18 NOTE — Telephone Encounter (Signed)
Left message for pt to call me back to schedule CPAP study

## 2020-01-19 ENCOUNTER — Telehealth: Payer: Self-pay

## 2020-01-19 NOTE — Telephone Encounter (Signed)
Returned patient's call back but unable to reach patient again this morning. Left message asking patient to call me back directly.

## 2020-01-31 ENCOUNTER — Other Ambulatory Visit (HOSPITAL_COMMUNITY)
Admission: RE | Admit: 2020-01-31 | Discharge: 2020-01-31 | Disposition: A | Discharge status: Home / Self Care | Payer: Medicaid Other | Source: Ambulatory Visit | Attending: Neurology | Admitting: Neurology

## 2020-01-31 DIAGNOSIS — Z01812 Encounter for preprocedural laboratory examination: Secondary | ICD-10-CM | POA: Diagnosis present

## 2020-01-31 DIAGNOSIS — Z20822 Contact with and (suspected) exposure to covid-19: Secondary | ICD-10-CM | POA: Diagnosis not present

## 2020-01-31 LAB — SARS CORONAVIRUS 2 (TAT 6-24 HRS): SARS Coronavirus 2: NEGATIVE

## 2020-02-02 ENCOUNTER — Other Ambulatory Visit: Payer: Self-pay

## 2020-02-02 ENCOUNTER — Ambulatory Visit (INDEPENDENT_AMBULATORY_CARE_PROVIDER_SITE_OTHER): Payer: Medicaid Other | Admitting: Neurology

## 2020-02-02 DIAGNOSIS — Z8673 Personal history of transient ischemic attack (TIA), and cerebral infarction without residual deficits: Secondary | ICD-10-CM

## 2020-02-02 DIAGNOSIS — G4733 Obstructive sleep apnea (adult) (pediatric): Secondary | ICD-10-CM | POA: Diagnosis not present

## 2020-02-02 DIAGNOSIS — G4719 Other hypersomnia: Secondary | ICD-10-CM

## 2020-02-02 DIAGNOSIS — G4761 Periodic limb movement disorder: Secondary | ICD-10-CM

## 2020-02-02 DIAGNOSIS — G4720 Circadian rhythm sleep disorder, unspecified type: Secondary | ICD-10-CM

## 2020-02-02 DIAGNOSIS — R351 Nocturia: Secondary | ICD-10-CM

## 2020-02-02 DIAGNOSIS — R519 Headache, unspecified: Secondary | ICD-10-CM

## 2020-02-10 ENCOUNTER — Emergency Department (HOSPITAL_COMMUNITY)
Admission: EM | Admit: 2020-02-10 | Discharge: 2020-02-10 | Disposition: A | Discharge status: Home / Self Care | Payer: Medicaid Other | Attending: Emergency Medicine | Admitting: Emergency Medicine

## 2020-02-10 ENCOUNTER — Other Ambulatory Visit: Payer: Self-pay

## 2020-02-10 ENCOUNTER — Emergency Department (HOSPITAL_COMMUNITY): Payer: Medicaid Other

## 2020-02-10 ENCOUNTER — Encounter (HOSPITAL_COMMUNITY): Payer: Self-pay

## 2020-02-10 DIAGNOSIS — R059 Cough, unspecified: Secondary | ICD-10-CM

## 2020-02-10 DIAGNOSIS — I10 Essential (primary) hypertension: Secondary | ICD-10-CM | POA: Diagnosis not present

## 2020-02-10 DIAGNOSIS — J45909 Unspecified asthma, uncomplicated: Secondary | ICD-10-CM | POA: Diagnosis not present

## 2020-02-10 DIAGNOSIS — R05 Cough: Secondary | ICD-10-CM | POA: Diagnosis present

## 2020-02-10 DIAGNOSIS — Z8673 Personal history of transient ischemic attack (TIA), and cerebral infarction without residual deficits: Secondary | ICD-10-CM | POA: Diagnosis not present

## 2020-02-10 DIAGNOSIS — J189 Pneumonia, unspecified organism: Secondary | ICD-10-CM | POA: Diagnosis not present

## 2020-02-10 DIAGNOSIS — Z20822 Contact with and (suspected) exposure to covid-19: Secondary | ICD-10-CM | POA: Insufficient documentation

## 2020-02-10 DIAGNOSIS — Z79899 Other long term (current) drug therapy: Secondary | ICD-10-CM | POA: Diagnosis not present

## 2020-02-10 MED ORDER — DOXYCYCLINE HYCLATE 100 MG PO CAPS: 100.0000 mg | ORAL_CAPSULE | Freq: Two times a day (BID) | ORAL | 0 refills | Status: DC

## 2020-02-10 MED ORDER — ONDANSETRON 4 MG PO TBDP: 4.0000 mg | ORAL_TABLET | Freq: Three times a day (TID) | ORAL | 0 refills | Status: DC | PRN

## 2020-02-10 MED ORDER — BENZONATATE 100 MG PO CAPS: 100.0000 mg | ORAL_CAPSULE | Freq: Three times a day (TID) | ORAL | 0 refills | Status: DC

## 2020-02-10 MED ORDER — DOXYCYCLINE HYCLATE 100 MG PO CAPS: 100.0000 mg | ORAL_CAPSULE | Freq: Two times a day (BID) | ORAL | 0 refills | Status: AC

## 2020-02-10 NOTE — ED Notes (Signed)
Patient verbalizes understanding of discharge instructions. Opportunity for questioning and answers were provided. Armband removed by staff, pt discharged from ED ambulatory to home.  

## 2020-02-10 NOTE — Discharge Instructions (Addendum)
As discussed, your x-ray was concerning for possible pneumonia. I am sending you home with antibiotics, cough medication, and nausea medication. Take as prescribed. Your COVID test is pending. Please self quarantine until you results become available within the next 24 hours. Follow-up with PCP if symptoms do not improve within the next week. Return to the ER for new or worsening symptoms.

## 2020-02-10 NOTE — ED Triage Notes (Signed)
Pt arrives to ED w/ c/o cough and fever x 3 days.

## 2020-02-10 NOTE — ED Provider Notes (Signed)
[facility] EMERGENCY DEPARTMENT Provider Note   CSN: [id number] Arrival date & time: 02/10/20  1127     History Chief Complaint  Patient presents with  . Cough    Sarah LESINSKI is a 54 y.o. female with a past medical history significant for asthma, hypertension, manic depression, history of CVA, history of TIAs, and restless leg syndrome who presents to the ED due to productive cough and fever x3 days.  Patient states she has been experiencing an intermittent productive cough with yellow/palish phlegm.  She also admits to an intermittent fever, but she is unsure what her temperature has been and notes she just "felt warm".  She has a history of asthma and admits to "breathing harder" over the past 24 hours, but denies associated wheeze.  Denies chest pain.  She has tried over-the-counter cough medication and Tylenol/ibuprofen with moderate relief.  Denies sick contacts and Covid exposures.  Patient had a negative Covid test on 01/31/2020 prior to his sleep study.  Denies lower extremity edema.  Denies abdominal pain, nausea, vomiting, and diarrhea.  History obtained from patient and past medical records. No interpreter used during encounter.      Past Medical History:  Diagnosis Date  . Asthma   . Essential hypertension   . Essential hypertension 05/26/2017  . Insomnia   . Manic depression ([place])   . Obesity   . Restless leg syndrome   . Stroke [facility])     Patient Active Problem List   Diagnosis Date Noted  . TIA (transient ischemic attack) 08/19/2019  . Morbid obesity ([place]) 08/19/2019  . Essential hypertension 05/26/2017  . Hyperglycemia 05/26/2017  . Microcytic anemia 05/26/2017  . CVA (cerebral vascular accident) ([place]) 05/25/2017    Past Surgical History:  Procedure Laterality Date  . BREAST LUMPECTOMY Left   . CESAREAN SECTION     x3     OB History   No obstetric history on file.     Family History  Problem Relation Age of Onset  . CAD Mother  67  . Other Father 79       struck by lightning  . Stroke Neg Hx     Social History   Tobacco Use  . Smoking status: Never Smoker  . Smokeless tobacco: Never Used  Substance Use Topics  . Alcohol use: No  . Drug use: No    Home Medications Prior to Admission medications   Medication Sig Start Date End Date Taking? Authorizing Provider  albuterol (PROVENTIL HFA;VENTOLIN HFA) 108 (90 BASE) MCG/ACT inhaler Inhale 1-2 puffs into the lungs every 4 (four) hours as needed for wheezing or shortness of breath. 11/28/13  Yes Baker, Zachary H, PA-C  QUEtiapine (SEROQUEL) 100 MG tablet Take 100 mg by mouth at bedtime.   Yes [provider]  sertraline (ZOLOFT) 50 MG tablet Take 75 mg by mouth daily.   Yes [provider]  traZODone (DESYREL) 100 MG tablet Take 100 mg by mouth at bedtime.   Yes [provider]  atorvastatin (LIPITOR) 40 MG tablet Take 1 tablet (40 mg total) by mouth daily at 6 PM. Patient not taking: Reported on 02/10/2020 08/21/19 11/29/19  Darliss Cheney, MD  benzonatate (TESSALON) 100 MG capsule Take 1 capsule (100 mg total) by mouth every 8 (eight) hours. 02/10/20   Suzy Bouchard, PA-C  doxycycline (VIBRAMYCIN) 100 MG capsule Take 1 capsule (100 mg total) by mouth 2 (two) times daily for 7 days. 02/10/20 02/17/20  Charmaine Downs  C, PA-C  Elastic Bandages & Supports (FUTURO SOFT CERVICAL COLLAR) MISC 1 Device by Does not apply route daily as needed. 03/06/19   Margarita Mail, PA-C  ferrous sulfate 325 (65 FE) MG tablet Take 1 tablet (325 mg total) by mouth daily. Patient not taking: Reported on 02/10/2020 01/11/19   Valarie Merino, MD  lisinopril (ZESTRIL) 20 MG tablet Take 1 tablet (20 mg total) by mouth daily. Patient not taking: Reported on 02/10/2020 08/23/19 11/29/19  Darliss Cheney, MD  methocarbamol (ROBAXIN) 500 MG tablet Take 1 tablet (500 mg total) by mouth 3 (three) times daily as needed for muscle spasms. Patient not taking: Reported on  02/10/2020 03/06/19   Margarita Mail, PA-C  ondansetron (ZOFRAN ODT) 4 MG disintegrating tablet Take 1 tablet (4 mg total) by mouth every 8 (eight) hours as needed for nausea or vomiting. 02/10/20   Suzy Bouchard, PA-C    Allergies    Patient has no known allergies.  Review of Systems   Review of Systems  Constitutional: Positive for chills and fever (subjective).  Respiratory: Positive for cough and shortness of breath.   Cardiovascular: Negative for chest pain and leg swelling.  Gastrointestinal: Negative for abdominal pain, diarrhea, nausea and vomiting.  Neurological: Negative for headaches.  All other systems reviewed and are negative.   Physical Exam Updated Vital Signs BP (!) 160/105   Pulse 94   Temp 99.9 F (37.7 C) (Oral)   Resp 20   Ht 5\' 4"  (1.626 m)   Wt 117.9 kg   SpO2 97%   BMI 44.63 kg/m   Physical Exam Vitals and nursing note reviewed.  Constitutional:      General: She is not in acute distress.    Appearance: She is not ill-appearing.     Comments: Well-appearing 54 year old female.  HENT:     Head: Normocephalic.  Eyes:     Pupils: Pupils are equal, round, and reactive to light.  Neck:     Comments: No meningismus. Cardiovascular:     Rate and Rhythm: Normal rate and regular rhythm.     Pulses: Normal pulses.     Heart sounds: Normal heart sounds. No murmur. No friction rub. No gallop.   Pulmonary:     Effort: Pulmonary effort is normal.     Breath sounds: Normal breath sounds.     Comments: Respirations equal and unlabored, patient able to speak in full sentences, lungs clear to auscultation bilaterally Abdominal:     General: Abdomen is flat. Bowel sounds are normal. There is no distension.     Palpations: Abdomen is soft.     Tenderness: There is no abdominal tenderness. There is no guarding or rebound.  Musculoskeletal:     Cervical back: Neck supple.     Comments: No lower extremity edema.  Negative Homans sign bilaterally.  Skin:     General: Skin is warm and dry.  Neurological:     General: No focal deficit present.     Mental Status: She is alert.  Psychiatric:        Mood and Affect: Mood normal.        Behavior: Behavior normal.     ED Results / Procedures / Treatments   Labs (all labs ordered are listed, but only abnormal results are displayed) Labs Reviewed  SARS CORONAVIRUS 2 (TAT 6-24 HRS)    EKG None  Radiology DG Chest Portable 1 View  Result Date: 02/10/2020 CLINICAL DATA:  Cough, shortness of breath. EXAM: PORTABLE  CHEST 1 VIEW COMPARISON:  CT angiogram chest 03/04/2018, chest radiographs 03/04/2018 and earlier FINDINGS: Heart size within normal limits. Redemonstrated peribronchial thickening. Some ill-defined opacity at the medial right lung base may reflect atelectasis or pneumonia. No evidence of pleural effusion or pneumothorax. No acute bony abnormality. IMPRESSION: Subtle ill-defined opacity at the medial right lung base may reflect atelectasis or pneumonia. Chronic bronchitic changes. Electronically Signed   By: Kellie Simmering DO   On: 02/10/2020 14:41    Procedures Procedures (including critical care time)  Medications Ordered in ED Medications - No data to display  ED Course  I have reviewed the triage vital signs and the nursing notes.  Pertinent labs & imaging results that were available during my care of the patient were reviewed by me and considered in my medical decision making (see chart for details).    MDM Rules/Calculators/A&P                     54 year old female presents to the ED for evaluation of productive cough and subjective fever x3 days.  Denies sick contacts and Covid exposure.  Patient had a negative Covid test on 01/31/2020 prior to a sleep study.  Admits to "breathing harder", but denies chest pain and associated wheeze.  She has a history of asthma.  Upon arrival, patient is afebrile, not tachycardic or hypoxic.  Patient in no acute distress and  non-ill-appearing.  Pressure elevated 160/105 however patient denies headache, visual changes, and chest pain.  Doubt hypertensive emergency/urgency.  We will continue to monitor blood pressure.  Physical exam reassuring.  Patient speaking in full sentences.  Lungs clear to auscultation bilaterally.  No accessory muscle usage.  No lower extremity edema.  Abdomen soft, nondistended, nontender.  Will obtain chest x-ray to rule out bacterial pneumonia.  Also obtain PCR Covid test.  Suspect symptoms related to Covid infection vs another viral etiology.  We will not test for influenza given she is out of the 72-hour window for Tamiflu. No wheeze appreciated on exam, doubt asthma exacerbation.   CXR personally reviewed which demonstrates: IMPRESSION:  Subtle ill-defined opacity at the medial right lung base may reflect  atelectasis or pneumonia.    Chronic bronchitic changes.   Given patient has a productive cough with subjective fever, will discharge patient with doxycycline for CAP. Will also discharge with cough and nausea medication.  Patient able to ambulate here in the ED and maintain O2 saturation >95%. Covid test pending.  Advised patient to self quarantine until Covid results become available.  Instructed patient to follow-up with PCP if symptoms do not improve within the next week. Strict ED precautions discussed with patient. Patient states understanding and agrees to plan. Patient discharged home in no acute distress and stable vitals.  KARLIEE ALTIZER was evaluated in Emergency Department on 02/10/2020 for the symptoms described in the history of present illness. She was evaluated in the context of the global COVID-19 pandemic, which necessitated consideration that the patient might be at risk for infection with the SARS-CoV-2 virus that causes COVID-19. Institutional protocols and algorithms that pertain to the evaluation of patients at risk for COVID-19 are in a state of rapid change based on  information released by regulatory bodies including the CDC and federal and state organizations. These policies and algorithms were followed during the patient's care in the ED. Final Clinical Impression(s) / ED Diagnoses Final diagnoses:  Community acquired pneumonia of right middle lobe of lung  Cough  Rx / DC Orders ED Discharge Orders         Ordered    doxycycline (VIBRAMYCIN) 100 MG capsule  2 times daily     02/10/20 1452    benzonatate (TESSALON) 100 MG capsule  Every 8 hours     02/10/20 1452    ondansetron (ZOFRAN ODT) 4 MG disintegrating tablet  Every 8 hours PRN     02/10/20 1452           Karie Kirks 02/10/20 1510    Wyvonnia Dusky, MD 02/10/20 1821

## 2020-02-10 NOTE — ED Notes (Signed)
Ambulated pt in the room pt ambulated well 02 saturation remained at 98% no complaints noted at this time

## 2020-02-10 NOTE — ED Notes (Signed)
Family at bedside. 

## 2020-02-16 ENCOUNTER — Telehealth: Payer: Self-pay

## 2020-02-16 ENCOUNTER — Ambulatory Visit: Payer: Medicaid Other | Admitting: Obstetrics and Gynecology

## 2020-02-16 NOTE — Addendum Note (Signed)
Addended by: Star Age on: 02/16/2020 07:56 AM   Modules accepted: Orders

## 2020-02-16 NOTE — Telephone Encounter (Signed)
-----   Message from Star Age, MD sent at 02/16/2020  7:56 AM EDT ----- Patient referred by Frann Rider, seen by me on 12/07/19, diagnostic PSG on 12/30/19. Patient had a CPAP titration study on 02/02/20.  Please call and inform patient that I have entered an order for treatment with positive airway pressure (PAP) treatment for obstructive sleep apnea (OSA). She did well during the latest sleep study with CPAP. We will, therefore, arrange for a machine for home use through a DME (durable medical equipment) company of Her choice; and I will see the patient back in follow-up in about 10 weeks. Please also explain to the patient that I will be looking out for compliance data, which can be downloaded from the machine (stored on an SD card, that is inserted in the machine) or via remote access through a modem, that is built into the machine. At the time of the followup appointment we will discuss sleep study results and how it is going with PAP treatment at home. Please advise patient to bring Her machine at the time of the first FU visit, even though this is cumbersome. Bringing the machine for every visit after that will likely not be needed, but often helps for the first visit to troubleshoot if needed. Please re-enforce the importance of compliance with treatment and the need for [place] to monitor compliance data - often an insurance requirement and actually good feedback for the patient as far as how they are doing.  Also remind patient, that any interim PAP machine or mask issues should be first addressed with the DME company, as they can often help better with technical and mask fit issues. Please ask if patient has a preference regarding DME company.  Please also make sure, the patient has a follow-up appointment with me in about 10 weeks from the setup date, thanks. May see one of our nurse practitioners if needed for proper timing of the FU appointment.  Please fax or rout report to the referring provider.  Thanks,   Star Age, MD, PhD [facility])

## 2020-02-16 NOTE — Telephone Encounter (Signed)
I called pt. No answer, left a message asking pt to call me back.   

## 2020-02-16 NOTE — Progress Notes (Signed)
Patient referred by Frann Rider, seen by me on 12/07/19, diagnostic PSG on 12/30/19. Patient had a CPAP titration study on 02/02/20.  Please call and inform patient that I have entered an order for treatment with positive airway pressure (PAP) treatment for obstructive sleep apnea (OSA). She did well during the latest sleep study with CPAP. We will, therefore, arrange for a machine for home use through a DME (durable medical equipment) company of Her choice; and I will see the patient back in follow-up in about 10 weeks. Please also explain to the patient that I will be looking out for compliance data, which can be downloaded from the machine (stored on an SD card, that is inserted in the machine) or via remote access through a modem, that is built into the machine. At the time of the followup appointment we will discuss sleep study results and how it is going with PAP treatment at home. Please advise patient to bring Her machine at the time of the first FU visit, even though this is cumbersome. Bringing the machine for every visit after that will likely not be needed, but often helps for the first visit to troubleshoot if needed. Please re-enforce the importance of compliance with treatment and the need for [place] to monitor compliance data - often an insurance requirement and actually good feedback for the patient as far as how they are doing.  Also remind patient, that any interim PAP machine or mask issues should be first addressed with the DME company, as they can often help better with technical and mask fit issues. Please ask if patient has a preference regarding DME company.  Please also make sure, the patient has a follow-up appointment with me in about 10 weeks from the setup date, thanks. May see one of our nurse practitioners if needed for proper timing of the FU appointment.  Please fax or rout report to the referring provider. Thanks,   Star Age, MD, PhD [facility])

## 2020-02-16 NOTE — Procedures (Signed)
PATIENT'S NAME:  Sarah Bean, Conrady DOB:      [birth year]      MR#:    [id number]     DATE OF RECORDING: 02/02/2020 REFERRING M.D.:  Frann Rider, NP Study Performed:   CPAP  Titration HISTORY: 54 year old woman with a history of stroke, TIA, mood disorder, restless leg syndrome, hypertension, asthma, and morbid obesity with a BMI of over 40, who presents for a full night titration study to treat her obstructive sleep apnea. Her baseline sleep study from 12/30/19 showed severe obstructive sleep apnea, with a total AHI of 31/hour, and O2 nadir of 76%. The patient's weight 236 pounds with a height of 65 (inches), resulting in a BMI of 39.3 kg/m2. The patient's neck circumference measured 17 inches.  CURRENT MEDICATIONS: Tylenol, Proventil, Pamprin, Lipitor, Vivarin, ferrous sulfate, Advil, Zestril, Robaxin, Seroquel, Requip, Zoloft, Desyrel  PROCEDURE:  This is a multichannel digital polysomnogram utilizing the SomnoStar 11.2 system.  Electrodes and sensors were applied and monitored per AASM Specifications.   EEG, EOG, Chin and Limb EMG, were sampled at 200 Hz.  ECG, Snore and Nasal Pressure, Thermal Airflow, Respiratory Effort, CPAP Flow and Pressure, Oximetry was sampled at 50 Hz. Digital video and audio were recorded.      The patient was fitted with a medium Simplus FFM. CPAP was initiated at 5 cmH20 with heated humidity per AASM split night standards and pressure was advanced to 16 cmH20 because of hypopneas, apneas and desaturations.  At a PAP pressure of 15 cmH20, there was a reduction of the AHI to 0/hour with supine REM sleep achieved and O2 nadir of 91%.  Lights Out was at 21:42 and Lights On at 05:13. Total recording time (TRT) was 451.5 minutes, with a total sleep time (TST) of 233.5 minutes. The patient's sleep latency was 206.5 minutes, which is markedly delayed. REM latency was 161 minutes, which is delayed. The sleep efficiency was 51.7%, which is reduced.    SLEEP ARCHITECTURE: WASO (Wake  after sleep onset) was 82.5 minutes with mild to moderate sleep fragmentation noted. There were 17.5 minutes in Stage N1, 101.5 minutes Stage N2, 52 minutes Stage N3 and 62.5 minutes in Stage REM.  The percentage of Stage N1 was 7.5%, Stage N2 was 43.5%, Stage N3 was 22.3% and Stage R (REM sleep) was 26.8%, which is mildly increased. The arousals were noted as: 15 were spontaneous, 0 were associated with PLMs, 40 were associated with respiratory events.  RESPIRATORY ANALYSIS:  There was a total of 25 respiratory events: 16 obstructive apneas, 0 central apneas and 1 mixed apneas with a total of 17 apneas and an apnea index (AHI) of 4.4 /hour. There were 8 hypopneas with a hypopnea index of 2.1/hour. The patient also had 0 respiratory event related arousals (RERAs).      The total APNEA/HYPOPNEA INDEX  (AHI) was 6.4 /hour and the total RESPIRATORY DISTURBANCE INDEX was 6.4 /hour  2 events occurred in REM sleep and 23 events in NREM. The REM AHI was 1.9 /hour versus a non-REM AHI of 8.1 /hour.  The patient spent 53.5 minutes of total sleep time in the supine position and 180 minutes in non-supine. The supine AHI was 10.1, versus a non-supine AHI of 5.3.  OXYGEN SATURATION & C02:  The baseline 02 saturation was 96%, with the lowest being 83%. Time spent below 89% saturation equaled 6 minutes.  PERIODIC LIMB MOVEMENTS:  The patient had a total of 15 Periodic Limb Movements. The Periodic Limb Movement (PLM) index  was 3.9 and the PLM Arousal index was 0 /hour.  Audio and video analysis did not show any abnormal or unusual movements, behaviors, phonations or vocalizations. The patient took 2 bathroom breaks. The EKG was in keeping with normal sinus rhythm.  Post-study, the patient indicated that sleep was better than usual.   IMPRESSION: 1. Obstructive Sleep Apnea (OSA) 2. Dysfunctions associated with sleep stages or arousal from sleep   RECOMMENDATIONS: 1. This study demonstrates resolution of the  patient's obstructive sleep apnea with CPAP therapy. I will, therefore, start the patient on home CPAP treatment at a pressure of 15 cm via medium FFM with (heated) humidity. The patient should be reminded to be fully compliant with PAP therapy to improve sleep related symptoms and decrease long term cardiovascular risks. The patient should be reminded, that it may take up to 3 months to get fully used to using PAP with all planned sleep. The earlier full compliance is achieved, the better long term compliance tends to be. Please note that untreated obstructive sleep apnea may carry additional perioperative morbidity. Patients with significant obstructive sleep apnea should receive perioperative PAP therapy and the surgeons and particularly the anesthesiologist should be informed of the diagnosis and the severity of the sleep disordered breathing. 2. This study shows sleep fragmentation and abnormal sleep stage percentages; these are nonspecific findings and per se do not signify an intrinsic sleep disorder or a cause for the patient's sleep-related symptoms. Causes include (but are not limited to) the first night effect of the sleep study, circadian rhythm disturbances, medication effect or an underlying mood disorder or medical problem.  3. The patient should be cautioned not to drive, work at heights, or operate dangerous or heavy equipment when tired or sleepy. Review and reiteration of good sleep hygiene measures should be pursued with any patient. 4. The patient will be seen in follow-up in the sleep clinic at [facility] for discussion of the test results, symptom and treatment compliance review, further management strategies, etc. The referring provider will be notified of the test results.   I certify that I have reviewed the entire raw data recording prior to the issuance of this report in accordance with the Standards of Accreditation of the American Academy of Sleep Medicine (AASM)     Star Age, MD,  PhD Diplomat, American Board of Neurology and Sleep Medicine (Neurology and Sleep Medicine)

## 2020-02-22 NOTE — Telephone Encounter (Signed)
I called pt. I advised pt that Dr. Rexene Alberts reviewed their sleep study results and found that pt did well during titration study on cpap. Dr. Rexene Alberts recommends that pt start on Cpap therapy. I reviewed PAP compliance expectations with the pt. Pt is agreeable to starting a CPAP. I advised pt that an order will be sent to a DME, [facility], and [facility] will call the pt within about one week after they file with the pt's insurance. [facility] will show the pt how to use the machine, fit for masks, and troubleshoot the CPAP if needed. A follow up appt was made for insurance purposes with Dr. Rexene Alberts on 05/10/2020 at 300 pm. Pt verbalized understanding to arrive 30 minutes early and bring their CPAP. A letter with all of this information in it will be mailed to the pt as a reminder. I verified with the pt that the address we have on file is correct. Pt verbalized understanding of results. Pt had no questions at this time but was encouraged to call back if questions arise. I have sent the order to [facility] and have received confirmation that they have received the order.

## 2020-02-23 ENCOUNTER — Ambulatory Visit: Payer: Medicaid Other | Attending: Internal Medicine

## 2020-02-23 DIAGNOSIS — Z20822 Contact with and (suspected) exposure to covid-19: Secondary | ICD-10-CM

## 2020-02-24 LAB — NOVEL CORONAVIRUS, NAA: SARS-CoV-2, NAA: NOT DETECTED

## 2020-02-24 LAB — SARS-COV-2, NAA 2 DAY TAT

## 2020-03-29 ENCOUNTER — Telehealth: Payer: Self-pay

## 2020-03-29 NOTE — Telephone Encounter (Signed)
Pt called requesting a call back, called pt back, no answer.

## 2020-04-06 ENCOUNTER — Ambulatory Visit: Payer: Medicaid Other | Attending: Critical Care Medicine

## 2020-04-06 DIAGNOSIS — Z23 Encounter for immunization: Secondary | ICD-10-CM

## 2020-04-06 NOTE — Progress Notes (Signed)
   Covid-19 Vaccination Clinic  Name:  Sarah Bean    MRN: [id number] DOB: 11/10/1965  04/06/2020  [name] was observed post Covid-19 immunization for 15 minutes without incident. She was provided with Vaccine Information Sheet and instruction to access the V-Safe system.   [name] was instructed to call 911 with any severe reactions post vaccine: Marland Kitchen Difficulty breathing  . Swelling of face and throat  . A fast heartbeat  . A bad rash all over body  . Dizziness and weakness   Immunizations Administered    Name Date Dose VIS Date Route   Moderna COVID-19 Vaccine 04/06/2020 11:36 AM 0.5 mL 09/2019 Intramuscular   Manufacturer: Levan Hurst   Lot: [id number]   [place]: [phone]

## 2020-04-12 ENCOUNTER — Ambulatory Visit: Payer: Medicaid Other | Admitting: Adult Health

## 2020-04-13 ENCOUNTER — Ambulatory Visit: Payer: Medicaid Other | Admitting: Adult Health

## 2020-05-04 ENCOUNTER — Ambulatory Visit: Payer: Medicaid Other | Attending: Critical Care Medicine

## 2020-05-04 DIAGNOSIS — Z23 Encounter for immunization: Secondary | ICD-10-CM

## 2020-05-04 NOTE — Progress Notes (Signed)
   Covid-19 Vaccination Clinic  Name:  MADDUX FIRST    MRN: [id number] DOB: [birth year]  05/04/2020  Ms. Gauthreaux was observed post Covid-19 immunization for 15 minutes without incident. She was provided with Vaccine Information Sheet and instruction to access the V-Safe system.   Ms. Blaszczyk was instructed to call 911 with any severe reactions post vaccine: Marland Kitchen Difficulty breathing  . Swelling of face and throat  . A fast heartbeat  . A bad rash all over body  . Dizziness and weakness

## 2020-05-09 ENCOUNTER — Telehealth: Payer: Self-pay

## 2020-05-09 NOTE — Telephone Encounter (Signed)
I contacted the pt and left a vm asking her to call me back. I am trying to verify is she has started her cpap-- Could not locate on resmed or care orchestr[facility].  If pt has not started cpap appt will need to be rescheduled 60 days out.

## 2020-05-10 ENCOUNTER — Ambulatory Visit: Payer: Self-pay | Admitting: Neurology

## 2020-05-10 ENCOUNTER — Telehealth: Payer: Self-pay | Admitting: Neurology

## 2020-05-10 NOTE — Telephone Encounter (Signed)
Patients states she called [facility] and they don't have a order for her C- PAP . Patient had apt with [facility] today but they CX apt because they didn't have order . [place]. . Patient  Wants a call back after order has been sent . I relayed to patient that we would take care of this and probably was done and we would be glad to resend for her.  Jinny Blossom can you look in to this please for her. Thanks Hinton Dyer.

## 2020-05-10 NOTE — Telephone Encounter (Signed)
I contacted the pt back and left a vm advising order has been received from [facility] and they are processing through her insurance. Pt advised [facility] will call once Josem Kaufmann is received from insurance to help her get started on the machine. Pt advised to call back after she starts on machine to schedule follow up.

## 2020-05-10 NOTE — Telephone Encounter (Signed)
Pt called me regarding the message she received from [place], [place]. I informed the pt that I would cancel her appt for today due to her not receiving her cpap yet. I gave the pt the contact info for [facility]. Also, I told the pt once she receives her machine to give [place] a call back to r/s this appt. Pt verbalized understanding.

## 2020-05-25 ENCOUNTER — Ambulatory Visit: Payer: Medicaid Other | Admitting: Adult Health

## 2020-06-06 NOTE — Telephone Encounter (Addendum)
I called and we were able to scheduled initial cpap f/u since she had started her cpap on 05/26/2020.  Pt has been scheduled for 08/01/2020 at 1 and will keep stroke f/u as scheduled for later in August.  Pt was reminded to use her machine at least 4 hours every night to be considered complaint with her insurance company.

## 2020-06-12 ENCOUNTER — Ambulatory Visit: Payer: Medicaid Other | Admitting: Advanced Practice Midwife

## 2020-06-12 ENCOUNTER — Encounter: Payer: Self-pay | Admitting: Advanced Practice Midwife

## 2020-06-12 ENCOUNTER — Other Ambulatory Visit (HOSPITAL_COMMUNITY)
Admission: RE | Admit: 2020-06-12 | Discharge: 2020-06-12 | Disposition: A | Discharge status: Home / Self Care | Payer: Medicaid Other | Source: Ambulatory Visit | Attending: Advanced Practice Midwife | Admitting: Advanced Practice Midwife

## 2020-06-12 ENCOUNTER — Other Ambulatory Visit: Payer: Self-pay

## 2020-06-12 VITALS — BP 142/87 | HR 81 | Ht 66.0 in | Wt 252.4 lb

## 2020-06-12 DIAGNOSIS — N760 Acute vaginitis: Secondary | ICD-10-CM | POA: Diagnosis not present

## 2020-06-12 DIAGNOSIS — Z01419 Encounter for gynecological examination (general) (routine) without abnormal findings: Secondary | ICD-10-CM | POA: Diagnosis not present

## 2020-06-12 DIAGNOSIS — Z124 Encounter for screening for malignant neoplasm of cervix: Secondary | ICD-10-CM | POA: Diagnosis not present

## 2020-06-12 DIAGNOSIS — B9689 Other specified bacterial agents as the cause of diseases classified elsewhere: Secondary | ICD-10-CM | POA: Diagnosis present

## 2020-06-12 DIAGNOSIS — N898 Other specified noninflammatory disorders of vagina: Secondary | ICD-10-CM

## 2020-06-12 DIAGNOSIS — N939 Abnormal uterine and vaginal bleeding, unspecified: Secondary | ICD-10-CM | POA: Diagnosis not present

## 2020-06-12 DIAGNOSIS — Z1239 Encounter for other screening for malignant neoplasm of breast: Secondary | ICD-10-CM

## 2020-06-12 MED ORDER — METRONIDAZOLE 500 MG PO TABS: 500.0000 mg | ORAL_TABLET | Freq: Two times a day (BID) | ORAL | 0 refills | Status: AC

## 2020-06-12 NOTE — Progress Notes (Signed)
Patient presents as a New Patient for AEX. Patient complains of having vaginal odor and discharge after sexual intercourse and cycles. She also complains of having heavy bleeding lasting for about 7 days associated with bad cramping. She states that she has a g=family history of fibroids.

## 2020-06-12 NOTE — Progress Notes (Addendum)
Subjective:     Sarah Bean is a 54 y.o. female here at [facility] for a routine exam. She is s/p BTL after her last child. Current complaints: Heavy painful periods. Has always had heavy periods but are worse in recent months. Regular, painful, with heavy bleeding and clots x 1 week.   Personal health questionnaire reviewed: yes.  Do you have a primary care provider? yes    Gynecologic History No LMP recorded. Contraception: tubal ligation Last Pap: unsure. Results were: normal Last mammogram: 2020. Results were: normal  Obstetric History OB History  No obstetric history on file.   Past Surgical History:  Procedure Laterality Date  . BREAST LUMPECTOMY Left   . CESAREAN SECTION     x3    The following portions of the patient's history were reviewed and updated as appropriate: allergies, current medications, past family history, past medical history, past social history, past surgical history and problem list.  Review of Systems Pertinent items noted in HPI and remainder of comprehensive ROS otherwise negative.    Objective:   VS reviewed, nursing note reviewed,  Constitutional: well developed, well nourished, no distress HEENT: normocephalic CV: normal rate Pulm/chest wall: normal effort Breast Exam:  right breast normal without mass, skin or nipple changes or axillary nodes, left breast normal without mass, skin or nipple changes or axillary nodes Abdomen: soft Neuro: alert and oriented x 3 Skin: warm, dry Psych: affect normal Pelvic exam: Cervix pink, visually closed, without lesion, scant white creamy discharge, vaginal walls and external genitalia normal Bimanual exam: Cervix 0/long/high, firm, anterior, neg CMT, uterus nontender, nonenlarged, adnexa without tenderness, enlargement, or mass     Assessment/Plan:   1. Well woman exam with routine gynecological exam   2. Abnormal uterine bleeding (AUB) --Pt with hx heavy painful periods, worsening in recent  years. She desire treatment for these. Given hx CVA and HTN, pt is not good candidate for most hormone replacement. [place] for contraception places POPs as a category 2 for initiation, but 3 to continue.  LNG is Category 2.  Discussed option of treatment with IUD, which pt is considering. --F/U in 1 month with MD for [place] results, IUD placement and further management of AUB  - US PELVIC COMPLETE WITH TRANSVAGINAL; Future  3. Bacterial vaginosis  - metroNIDAZOLE (FLAGYL) 500 MG tablet; Take 1 tablet (500 mg total) by mouth 2 (two) times daily for 7 days.  Dispense: 14 tablet; Refill: 0 - Cervicovaginal ancillary only( [place])  4. Screening for cervical cancer - Cytology - PAP  5. Vaginal discharge --symptoms c/w BV, swab collected, will treat with Flagyl.  Prevention discussed with pt and printed materials given.  6. Encounter for screening for malignant neoplasm of breast, unspecified screening modality  - MM DIGITAL SCREENING BILATERAL; Future     Follow up in: 1 month or as needed.   Fatima Blank, CNM 8:28 AM

## 2020-06-12 NOTE — Patient Instructions (Addendum)
Some natural remedies/prevention to try for bacterial vaginosis: --Take a probiotic tablet/capsule every day for at least 1-2 months.   --Whenever you have symptoms, use boric acid suppositories vaginally every night for a week.   --Do not use scented soaps/perfumes in the vaginal area, and do not overwash multiple times daily. --Wear breathable cotton underwear and do not wear tight restrictive clothing. --Limit pantyliner use, change your underwear several times daily instead. --Use condoms during intercourse.   Abnormal Uterine Bleeding Abnormal uterine bleeding is unusual bleeding from the uterus. It includes:  Bleeding or spotting between periods.  Bleeding after sex.  Bleeding that is heavier than normal.  Periods that last longer than usual.  Bleeding after menopause. Abnormal uterine bleeding can affect women at various stages in life, including teenagers, women in their reproductive years, pregnant women, and women who have reached menopause. Common causes of abnormal uterine bleeding include:  Pregnancy.  Growths of tissue (polyps).  A noncancerous tumor in the uterus (fibroid).  Infection.  Cancer.  Hormonal imbalances. Any type of abnormal bleeding should be evaluated by a health care provider. Many cases are minor and simple to treat, while others are more serious. Treatment will depend on the cause of the bleeding. Follow these instructions at home:  Monitor your condition for any changes.  Do not use tampons, douche, or have sex if told by your health care provider.  Change your pads often.  Get regular exams that include pelvic exams and cervical cancer screening.  Keep all follow-up visits as told by your health care provider. This is important. Contact a health care provider if:  Your bleeding lasts for more than one week.  You feel dizzy at times.  You feel nauseous or you vomit. Get help right away if:  You pass out.  Your bleeding soaks  through a pad every hour.  You have abdominal pain.  You have a fever.  You become sweaty or weak.  You pass large blood clots from your vagina. Summary  Abnormal uterine bleeding is unusual bleeding from the uterus.  Any type of abnormal bleeding should be evaluated by a health care provider. Many cases are minor and simple to treat, while others are more serious.  Treatment will depend on the cause of the bleeding. This information is not intended to replace advice given to you by your health care provider. Make sure you discuss any questions you have with your health care provider. Document Revised: 01/14/2018 Document Reviewed: 11/08/2016 Elsevier Patient Education  [place].

## 2020-06-13 ENCOUNTER — Ambulatory Visit (INDEPENDENT_AMBULATORY_CARE_PROVIDER_SITE_OTHER): Payer: Medicaid Other | Admitting: Adult Health

## 2020-06-13 ENCOUNTER — Encounter: Payer: Self-pay | Admitting: Adult Health

## 2020-06-13 VITALS — BP 134/79 | HR 73 | Ht 62.5 in | Wt 253.0 lb

## 2020-06-13 DIAGNOSIS — N939 Abnormal uterine and vaginal bleeding, unspecified: Secondary | ICD-10-CM | POA: Insufficient documentation

## 2020-06-13 DIAGNOSIS — Z8673 Personal history of transient ischemic attack (TIA), and cerebral infarction without residual deficits: Secondary | ICD-10-CM | POA: Diagnosis not present

## 2020-06-13 DIAGNOSIS — E785 Hyperlipidemia, unspecified: Secondary | ICD-10-CM

## 2020-06-13 DIAGNOSIS — I10 Essential (primary) hypertension: Secondary | ICD-10-CM

## 2020-06-13 DIAGNOSIS — G4730 Sleep apnea, unspecified: Secondary | ICD-10-CM | POA: Diagnosis not present

## 2020-06-13 LAB — CERVICOVAGINAL ANCILLARY ONLY
Bacterial Vaginitis (gardnerella): POSITIVE — AB
Candida Glabrata: NEGATIVE
Candida Vaginitis: NEGATIVE
Chlamydia: NEGATIVE
Comment: NEGATIVE
Comment: NEGATIVE
Comment: NEGATIVE
Comment: NEGATIVE
Comment: NEGATIVE
Comment: NORMAL
Neisseria Gonorrhea: NEGATIVE
Trichomonas: NEGATIVE

## 2020-06-13 MED ORDER — ASPIRIN EC 81 MG PO TBEC: 81.0000 mg | DELAYED_RELEASE_TABLET | Freq: Every day | ORAL | 11 refills | Status: DC

## 2020-06-13 MED ORDER — ATORVASTATIN CALCIUM 40 MG PO TABS: 40.0000 mg | ORAL_TABLET | Freq: Every day | ORAL | 3 refills | Status: DC

## 2020-06-13 NOTE — Patient Instructions (Addendum)
Restart aspirin 81 mg daily  and atorvastatin 40mg  for secondary stroke prevention  Please ensure follow up with PCP to repeat cholesterol levels  Continue use of CPAP machine to sleep apnea management  Continue to follow up with PCP regarding cholesterol and blood pressure management  Maintain strict control of hypertension with blood pressure goal below 130/90, diabetes with hemoglobin A1c goal below 6.5% and cholesterol with LDL cholesterol (bad cholesterol) goal below 70 mg/dL.          Thank you for coming to see [place] at [facility]. I hope we have been able to provide you high quality care today.  You may receive a patient satisfaction survey over the next few weeks. We would appreciate your feedback and comments so that we may continue to improve ourselves and the health of our patients.

## 2020-06-13 NOTE — Progress Notes (Signed)
[facility] [address] [place] [address] [phone]       STROKE FOLLOW-UP NOTE  [name] Date of Birth:  [birth year] Medical Record Number:  [id number]    Chief Complaint  Patient presents with  . Follow-up    tx rm here for a f/u froma a stroke/ TIA. Pt has no new sx.  . Transient Ischemic Attack     HPI:   Today, 06/13/2020, [name] returns for stroke follow-up  She has been stable from a stroke standpoint without new or reoccurring stroke/TIA symptoms For unknown reason, she has not continued on aspirin or atorvastatin Blood pressure today 134/79 - just started back on BP pill yesterday due to elevated blood pressures  Suspected sleep apnea at prior visit therefore referred to [place] [facility] and was evaluated by Dr. Rexene Bean on 12/07/2019.  Underwent sleep study on 12/30/2019 which showed severe sleep apnea (AHI 31/h) and recommended titration study which was completed on 02/02/2020 and initiated CPAP.  CPAP initiated on 05/26/2020 and has initial CPAP compliance visit scheduled on 08/01/2020. She does report great benefit with use of CPAP with improvement of sleep as well as improvement of day time fatigue.   No concerns at this time     History provided for reference purposes only Hospital follow-up 11/29/2019 JM: Since discharge, she has been doing well without reoccurring symptoms.  She has returned back to all prior activities.  Completed 3 weeks DAPT and continues on aspirin alone without bleeding or bruising.  Continues on atorvastatin 40mg  daily without myalgias.  Blood pressure today 130/64.  Discussion regarding suspected sleep apnea with occasional morning headaches, occasional insomnia, daytime fatigue and snoring.  She states she typically takes caffeine pills during the day due to fatigue that interferes with daily activity.  She has not previously underwent sleep study.  Continues to follow with [facility] for underlying  depression/anxiety.  Denies new or worsening stroke/TIA symptoms.  Stroke admission 08/19/2019: [name] is a 54 y.o. female with history of morbid obestiy, HTN, RLS, prior stroke/TIA  who presented on 08/19/2019 with 2 episodes of dizziness over the past 24 hours and gait ataxia.  Evaluated by stroke team and Dr. Leonie Man with symptoms likely secondary to posterior circulation TIA with initial CT head no acute abnormality as well as repeat CT head unremarkable.  CT head did show remote history of silent left occipital infarct which was demonstrated on prior imaging.  Refused MRI due to claustrophobia.  CTA head/neck unremarkable.  2D echo showed an EF of 65 to 70% without cardiac source of embolus identified.  Recommended DAPT for 3 weeks then aspirin alone as not previously on antithrombotic.  HTN stable.  LDL 87 and initiated atorvastatin 40 mg daily.  No history or evidence of DM with A1c 5.4.  Other stroke risk factors include morbid obesity, prior history of stroke 05/2017 suspect a left subcortical infarct and suspected sleep apnea.  Other active problems include depression with anxiety and RLS.  She was discharged home with recommendation of home health PT/OT.  Stroke admission 05/25/2017: [name] is a 50 year African-American lady seen today for follow-up for the first time after recent hospital admission for strokelike symptoms. She is accompanied by her son. History is obtained from the patient, review of electronic medical records and have personally reviewed imaging films.Sarah Norenberg Brownis an 54 y.o.femalewith a history of hypertension, obesity and bipolar affective disorder presenting with numbness involving right side of  her face and right upper extremity of sudden onset. Initial symptoms occurred yesterday and lasted for about 1 hour. Current symptoms started at 6:00 PM on 05/25/2017 and have persisted. She had slurred speech on 05/25/2017, which has resolved. Numbness has persisted.  The  patient also reports vertigo that began about one week ago.  CT scan of her head showed no acute intracranial abnormality. She has not been on antiplatelet therapy daily. NIH stroke score was 1. LSN:1800 on 05/25/2017.Patient was not administered IV t-PA secondary to low NIHSS/minimal deficits on arrival. She was admitted   for further evaluation and treatment. CT scan of the head showed a small left occipital lobe nonhemorrhagic recent appearing infarct but patient was unable to tolerate MRI scan. Carotid Doppler showed no significant extracranial stenosis. Transthoracic echo showed normal ejection fraction without cardiac source of embolism. LDL cholesterol was elevated at 75 mg percent. Hemoglobin A1c was 5.5. Telemetry monitoring in the hospital did not show any cardiac arrhythmias.   07/15/17 visit Dr. Leonie Man: Patient had right-sided numbness which appears to have improved with a few days after discharge. She did not have any recurrent symptoms but does complain of some seen spots in front of her eyes which are moving around more on the right than the left. This has started only 2 weeks ago. She denies any accompanying headache, double vision, she is denies any decreased vision acuity or visual fields and has not been bumping into objects. She has not seen an eye doctor but plans to see one soon. She is tolerating aspirin without bleeding or bruising. She is also on Lipitor 40 mg is tolerating well. She states her blood pressure is well controlled and today it is 116/78. She takes Requip for restless legs which also seems to be well controlled. She plans on eating healthy diet but has not yet lost any weight. I explained to the patient that she probably had a left posterior circulation PCA branch infarct and Ct scan was suggestive  but not conclusive and she was unable to tolerate MRI in the hospital. Repeating an open MRI images confirm the diagnosis but is unlikely to change the treatment plan hence we would  not get an MRI as it might make her uncomfortable  04/07/18 UPDATE: Patient is being seen for follow-up.  She continues to complain of vision issues which she states have been worsening as her vision is blurry and " fungus".  She did undergo MRI on 09/09/2017 which showed chronic left occipital lobe infarcts but no other acute findings.  Patient has not schedule appointment with eye doctor since previous appointment as advised.  She does continue to take aspirin without side effects of bleeding or bruising.  Continues to take Lipitor without side effects myalgias.  Blood pressure today satisfactory 136/87.  Patient is followed by [facility] for depression/anxiety.  She currently is taking Seroquel, trazodone and Prozac but does need a refill on all of these and is planning on going to the walk-in clinic.  Denies new or worsening stroke/TIA symptoms at this time.   ROS:   14 system review of systems is positive for restless leg and all other systems negative  PMH:  Past Medical History:  Diagnosis Date  . Asthma   . Essential hypertension   . Essential hypertension 05/26/2017  . Insomnia   . Manic depression ([place])   . Obesity   . Restless leg syndrome   . Stroke [facility])     Social History:  Social History  Socioeconomic History  . Marital status: Single    Spouse name: Not on file  . Number of children: Not on file  . Years of education: Not on file  . Highest education level: Not on file  Occupational History  . Occupation: disabled  Tobacco Use  . Smoking status: Never Smoker  . Smokeless tobacco: Never Used  Vaping Use  . Vaping Use: Never used  Substance and Sexual Activity  . Alcohol use: No  . Drug use: No  . Sexual activity: Not Currently    Partners: Male    Birth control/protection: None  Other Topics Concern  . Not on file  Social History Narrative   Patient lives at home with her son, who has autism. He vocalizes and acts out.   Social Determinants of Health    Financial Resource Strain:   . Difficulty of Paying Living Expenses: Not on file  Food Insecurity:   . Worried About Charity fundraiser in the Last Year: Not on file  . Ran Out of Food in the Last Year: Not on file  Transportation Needs:   . Lack of Transportation (Medical): Not on file  . Lack of Transportation (Non-Medical): Not on file  Physical Activity:   . Days of Exercise per Week: Not on file  . Minutes of Exercise per Session: Not on file  Stress:   . Feeling of Stress : Not on file  Social Connections:   . Frequency of Communication with Friends and Family: Not on file  . Frequency of Social Gatherings with Friends and Family: Not on file  . Attends Religious Services: Not on file  . Active Member of Clubs or Organizations: Not on file  . Attends Archivist Meetings: Not on file  . Marital Status: Not on file  Intimate Partner Violence:   . Fear of Current or Ex-Partner: Not on file  . Emotionally Abused: Not on file  . Physically Abused: Not on file  . Sexually Abused: Not on file    Medications:   Current Outpatient Medications on File Prior to Visit  Medication Sig Dispense Refill  . albuterol (PROVENTIL HFA;VENTOLIN HFA) 108 (90 BASE) MCG/ACT inhaler Inhale 1-2 puffs into the lungs every 4 (four) hours as needed for wheezing or shortness of breath. 1 Inhaler 0  . Elastic Bandages & Supports (FUTURO SOFT CERVICAL COLLAR) MISC 1 Device by Does not apply route daily as needed. 1 each 0  . lisinopril-hydrochlorothiazide (ZESTORETIC) 20-25 MG tablet [name]    . methocarbamol (ROBAXIN) 500 MG tablet Take 1 tablet (500 mg total) by mouth 3 (three) times daily as needed for muscle spasms. 20 tablet 0  . metroNIDAZOLE (FLAGYL) 500 MG tablet Take 1 tablet (500 mg total) by mouth 2 (two) times daily for 7 days. 14 tablet 0  . QUEtiapine (SEROQUEL) 100 MG tablet Take 100 mg by mouth at bedtime.    . sertraline (ZOLOFT) 100 MG tablet Take 100  mg by mouth daily.    . traZODone (DESYREL) 100 MG tablet [name]      No current facility-administered medications on file prior to visit.    Allergies:  No Known Allergies  Physical Exam  Today's Vitals   06/13/20 1428  BP: 134/79  Pulse: 73  Weight: 253 lb (114.8 kg)  Height: 5' 2.5" (1.588 m)   Body mass index is 45.54 kg/m.   General: Obese pleasant middle-aged African-American lady  seated, in no evident distress Head: head normocephalic and atraumatic.  Neck: supple with no carotid or supraclavicular bruits Cardiovascular: regular rate and rhythm, no murmurs Musculoskeletal: no deformity Skin:  no rash/petichiae Vascular:  Normal pulses all extremities  Neurologic Exam Mental Status: Awake and fully alert.  Fluent speech and language.  Oriented to place and time. Recent and remote memory intact. Attention span, concentration and fund of knowledge appropriate. Mood and affect appropriate.  Cranial Nerves: Pupils equal, briskly reactive to light. Extraocular movements full without nystagmus. Visual fields full to confrontation. Hearing intact. Facial sensation intact. Face, tongue, palate moves normally and symmetrically.  Motor: Normal bulk and tone. Normal strength in all tested extremity muscles. Sensory.: intact to touch ,pinprick .position and vibratory sensation.  Coordination: Rapid alternating movements normal in all extremities. Finger-to-nose and heel-to-shin performed accurately bilaterally. Gait and Station: Arises from chair without difficulty. Stance is normal. Gait demonstrates normal stride length and balance . Able to heel, toe and tandem walk without difficulty.  Reflexes: 1+ and symmetric. Toes downgoing.        ASSESSMENT/PLAN: 54 year old lady with episode of dizziness and gait ataxia on 08/19/2019 likely due to posterior circulation TIA with prior history of strokelike episode 05/2017 with sudden onset right-sided  paresthesias.  Unable to obtain MRI due to severe claustrophobia.  Vascular risk factors include HTN, HLD, recurrent stroke/TIA, morbid obesity and recently diagnosed severe sleep apnea with initiation of CPAP.     History of stroke/TIA -Restart aspirin 81 mg daily  and Lipitor for secondary stroke prevention -discussion regarding ongoing compliance lifelong for secondary stroke prevention.  Refill provided for Lipitor but advised ongoing refills will need to be obtained by PCP -Ensure close PCP follow-up for aggressive stroke risk factor management including BP goal<130/90 and LDL goal<70  Severe OSA on CPAP -Reports benefit already in regards to insomnia and daytime fatigue -Continues to experience restless leg syndrome and advised possible improvement with CPAP treatment but if continues to experience symptoms, may consider treatment options at initial CPAP compliance visit -Discussed importance of ongoing compliance    Follow-up scheduled on 08/01/2020 for initial CPAP compliance visit Can follow-up on an as-needed basis as stable from stroke standpoint    I spent 30 minutes of face-to-face and non-face-to-face time with patient.  This included previsit chart review, lab review, study review, order entry, electronic health record documentation, patient education regarding history of prior strokes, importance of lifelong use of aspirin and statin, importance of managing stroke risk factors, review of sleep study and use of CPAP and answered all questions to patient satisfaction   Frann Rider, [facility]  [facility] [address] [place], [place] [address]  Phone [phone] Fax [phone]

## 2020-06-14 LAB — CYTOLOGY - PAP
Comment: NEGATIVE
Diagnosis: NEGATIVE
High risk HPV: NEGATIVE

## 2020-06-19 ENCOUNTER — Inpatient Hospital Stay: Admission: RE | Admit: 2020-06-19 | Payer: Medicaid Other | Source: Ambulatory Visit

## 2020-06-20 NOTE — Progress Notes (Signed)
I agree with the above plan 

## 2020-06-22 ENCOUNTER — Other Ambulatory Visit: Payer: Self-pay

## 2020-06-22 ENCOUNTER — Ambulatory Visit
Admission: RE | Admit: 2020-06-22 | Discharge: 2020-06-22 | Disposition: A | Discharge status: Home / Self Care | Payer: Medicaid Other | Source: Ambulatory Visit | Attending: Advanced Practice Midwife | Admitting: Advanced Practice Midwife

## 2020-06-22 DIAGNOSIS — N939 Abnormal uterine and vaginal bleeding, unspecified: Secondary | ICD-10-CM

## 2020-07-04 ENCOUNTER — Telehealth: Payer: Self-pay

## 2020-07-04 NOTE — Telephone Encounter (Signed)
I called patient to let her know she has a follow up to discuss her u/s on 07/17/2020 no answer and voice mail was too full.

## 2020-07-17 ENCOUNTER — Other Ambulatory Visit: Payer: Self-pay

## 2020-07-17 ENCOUNTER — Other Ambulatory Visit (HOSPITAL_COMMUNITY)
Admission: RE | Admit: 2020-07-17 | Discharge: 2020-07-17 | Disposition: A | Discharge status: Home / Self Care | Payer: Medicaid Other | Source: Ambulatory Visit | Attending: Obstetrics and Gynecology | Admitting: Obstetrics and Gynecology

## 2020-07-17 ENCOUNTER — Ambulatory Visit: Payer: Medicaid Other | Admitting: Advanced Practice Midwife

## 2020-07-17 ENCOUNTER — Ambulatory Visit: Payer: Medicaid Other | Admitting: Obstetrics and Gynecology

## 2020-07-17 ENCOUNTER — Encounter: Payer: Self-pay | Admitting: Obstetrics and Gynecology

## 2020-07-17 VITALS — BP 127/84 | HR 83 | Wt 254.3 lb

## 2020-07-17 DIAGNOSIS — N939 Abnormal uterine and vaginal bleeding, unspecified: Secondary | ICD-10-CM | POA: Insufficient documentation

## 2020-07-17 MED ORDER — MEGESTROL ACETATE 40 MG PO TABS: 40.0000 mg | ORAL_TABLET | Freq: Every day | ORAL | 5 refills | Status: DC

## 2020-07-17 NOTE — Progress Notes (Signed)
Pt is here for follow up visit, heavy bleeding. Patient had [place] on 06/22/20. Pt reports that she stopped bleeding after last appointment but has since started spotting again.

## 2020-07-17 NOTE — Progress Notes (Signed)
54 yo here for further management of AUB. Patient reports a monthly 7-day period heavy in flow with passage of clots. She reports a lighter period last month. She is without any complaints today  Past Medical History:  Diagnosis Date  . Asthma   . Essential hypertension   . Essential hypertension 05/26/2017  . Insomnia   . Manic depression ([place])   . Obesity   . Restless leg syndrome   . Stroke [facility])    Past Surgical History:  Procedure Laterality Date  . BREAST LUMPECTOMY Left   . CESAREAN SECTION     x3   Family History  Problem Relation Age of Onset  . CAD Mother 61  . Other Father 74       struck by lightning  . Stroke Neg Hx    Social History   Tobacco Use  . Smoking status: Never Smoker  . Smokeless tobacco: Never Used  Vaping Use  . Vaping Use: Never used  Substance Use Topics  . Alcohol use: No  . Drug use: No   ROS See pertinent in HPI  Blood pressure 127/84, pulse 83, weight 254 lb 4.8 oz (115.3 kg). GENERAL: Well-developed, well-nourished female in no acute distress.  ABDOMEN: Soft, nontender, nondistended. No organomegaly. PELVIC: Normal external female genitalia. Vagina is pink and rugated.  Normal discharge. Normal appearing cervix. Uterus is normal in size. No adnexal mass or tenderness. EXTREMITIES: No cyanosis, clubbing, or edema, 2+ distal pulses.  US PELVIC COMPLETE WITH TRANSVAGINAL  Result Date: 06/22/2020 CLINICAL DATA:  Abnormal uterine bleeding EXAM: TRANSABDOMINAL AND TRANSVAGINAL ULTRASOUND OF PELVIS TECHNIQUE: Both transabdominal and transvaginal ultrasound examinations of the pelvis were performed. Transabdominal technique was performed for global imaging of the pelvis including uterus, ovaries, adnexal regions, and pelvic cul-de-sac. It was necessary to proceed with endovaginal exam following the transabdominal exam to visualize the uterus endometrium ovaries. COMPARISON:  None FINDINGS: Uterus Measurements: 14.9 x 12.3 x 10.5 cm = volume:  1008.3 mL. Multiple uterine masses. Right anterior fundal mass measures 3.7 x 2.5 x 3.2 cm. Partially exophytic hypoechoic solid mass posterior uterine wall measures 7.9 x 7.1 x 5.7 cm. Endometrium Thickness: 19.1 mm. Possible hypoechoic endometrial mass measuring 1.8 x 1.5 x 2.2 cm. There may be additional hyperechoic endometrial mass measuring 2.1 x 0.9 x 1.5 cm. Right ovary Not seen. Left ovary Not seen. Other findings No abnormal free fluid. IMPRESSION: 1. Enlarged uterus with multiple masses presumably fibroids. 2. Endometrial thickness of 19.1 mm with possible endometrial masses as above. In the setting of post-menopausal bleeding, endometrial sampling is indicated to exclude carcinoma. If results are benign, sonohysterogram should be considered for focal lesion work-up prior to hysteroscopy. (Ref: Radiological Reasoning: Algorithmic Workup of Abnormal Vaginal Bleeding with Endovaginal Sonography and Sonohysterography. AJR 2008; 213:Y86-57) 3. Nonvisualized ovaries. Electronically Signed   By: Donavan Foil M.D.   On: 06/22/2020 15:20    A/P 54 yo with AUB - Reviewed ultrasound report with patient - Discussed benefits of endometrial biopsy ENDOMETRIAL BIOPSY     The indications for endometrial biopsy were reviewed.   Risks of the biopsy including cramping, bleeding, infection, uterine perforation, inadequate specimen and need for additional procedures  were discussed. The patient states she understands and agrees to undergo procedure today. Consent was signed. Time out was performed. Urine HCG was negative. A sterile speculum was placed in the patient's vagina and the cervix was prepped with Betadine. A single-toothed tenaculum was placed on the anterior lip of the cervix  to stabilize it. The uterine cavity was sounded to a depth of 13 cm using the uterine sound. The 3 mm pipelle was introduced into the endometrial cavity without difficulty, 2 passes were made.  A  moderate amount of tissue was  sent  to pathology. The instruments were removed from the patient's vagina. Minimal bleeding from the cervix was noted. The patient tolerated the procedure well.  Routine post-procedure instructions were given to the patient. The patient will follow up in two weeks to review the results and for further management.  - Discussed medical management with Megace vs endometrial ablation vs hysterectomy. Patient is not interested in a hysterectomy. Rx megace provided

## 2020-07-24 ENCOUNTER — Telehealth: Payer: Self-pay | Admitting: *Deleted

## 2020-07-24 ENCOUNTER — Telehealth (INDEPENDENT_AMBULATORY_CARE_PROVIDER_SITE_OTHER): Payer: Medicaid Other | Admitting: Obstetrics and Gynecology

## 2020-07-24 ENCOUNTER — Encounter: Payer: Self-pay | Admitting: Obstetrics and Gynecology

## 2020-07-24 DIAGNOSIS — C541 Malignant neoplasm of endometrium: Secondary | ICD-10-CM | POA: Diagnosis not present

## 2020-07-24 NOTE — Telephone Encounter (Signed)
Nurse from Dr Elly Modena office can scheduled a patient for endo ca on 10/12. Dr Elly Modena office will call the patient

## 2020-07-24 NOTE — Progress Notes (Signed)
I connected with  Sarah Bean on 07/24/20 by a video enabled telemedicine application and verified that I am speaking with the correct person using two identifiers.  Patient is home and I am at I am at [facility].    I discussed the limitations of evaluation and management by telemedicine. The patient expressed understanding and agreed to proceed.  MyChart GYN, patient noted that the heavy bleeding has lighten up a little.

## 2020-07-24 NOTE — Progress Notes (Signed)
GYNECOLOGY VIRTUAL VISIT ENCOUNTER NOTE  Provider location: [facility] [place]   I connected with [name] on 07/24/20 at  9:00 AM EDT by MyChart Video Encounter at home and verified that I am speaking with the correct person using two identifiers.   I discussed the limitations, risks, security and privacy concerns of performing an evaluation and management service virtually and the availability of in person appointments. I also discussed with the patient that there may be a patient responsible charge related to this service. The patient expressed understanding and agreed to proceed.   History:  Sarah Bean is a 54 y.o. G3P0 female being evaluated today to discuss results. Patient reports doing well and is without complaints. She denies any abnormal vaginal discharge, bleeding, pelvic pain or other concerns. Reviewed endometrial biopsy results with the patient and diagnosis of endometria cancer. Emotional support provided. Discussed follow up management plan with GYN ONC.       Past Medical History:  Diagnosis Date   Asthma    Essential hypertension    Essential hypertension 05/26/2017   Insomnia    Manic depression ([facility])    Obesity    Restless leg syndrome    Stroke [facility])    Past Surgical History:  Procedure Laterality Date   BREAST LUMPECTOMY Left    CESAREAN SECTION     x3   The following portions of the patient's history were reviewed and updated as appropriate: allergies, current medications, past family history, past medical history, past social history, past surgical history and problem list.   Health Maintenance:  Normal pap and negative HRHPV on 05/2020.   Review of Systems:  Pertinent items noted in HPI and remainder of comprehensive ROS otherwise negative.  Physical Exam:   General:  Alert, oriented and cooperative. Patient appears to be in no acute distress.  Mental Status: Normal mood and affect. Normal behavior. Normal  judgment and thought content.   Respiratory: Normal respiratory effort, no problems with respiration noted  Rest of physical exam deferred due to type of encounter  Labs and Imaging Results for orders placed or performed in visit on 07/17/20 (from the past 336 hour(s))  Surgical pathology( [place])   Collection Time: 07/17/20  3:38 PM  Result Value Ref Range   SURGICAL PATHOLOGY      SURGICAL PATHOLOGY CASE: [id number] PATIENT: Sarah Bean Surgical Pathology Report     Clinical History: AUB, thickened endometrium (cm)     FINAL MICROSCOPIC DIAGNOSIS:  A. ENDOMETRIUM, BIOPSY: - Endometrioid adenocarcinoma with squamous differentiation, FIGO grade 1.  See comment     COMMENT:  Dr. Saralyn Pilar reviewed the case and concurs with the diagnosis.  Dr. Elly Modena was paged on 07/20/2020.  Immunohistochemical stains for MMR-related proteins are pending and will be reported in an addendum.  GROSS DESCRIPTION:  Received in formalin are tan, hemorrhagic soft tissue fragments that are entirely submitted. Volume: 3 x 2.2 x 0.5 cm. (1 B) (AK 07/18/2020)     Final Diagnosis performed by Sarah Folds, MD.   Electronically signed 07/20/2020 Technical component performed at [facility]. [address], [place] [address] [place], [place] [address].  Professional component performed at [facility], [place] [address], [place], [place] [address].  Immunohistochemistry Technical component (if applicable) was performed at [facility]. [address] [place], [place], [place] [address].   IMMUNOHISTOCHEMISTRY DISCLAIMER (if applicable): Some of these immunohistochemical stains may have been developed and  the performance characteristics determine by [facility]. Some may not have been cleared or approved by the U.S. Food and Drug Administration. The FDA has determined that such clearance or approval is not necessary.  This test is used for clinical purposes. It should not be regarded as investigational or for research. This laboratory is certified under the [place] (CLIA-88) as qualified to perform high complexity clinical laboratory testing.  The controls stained appropriately.    No results found.     Assessment and Plan:     1. Endometrial cancer [facility]) All questions were answered Patient referred to GYN ONC for further management  - Ambulatory referral to Gynecologic Oncology       I discussed the assessment and treatment plan with the patient. The patient was provided an opportunity to ask questions and all were answered. The patient agreed with the plan and demonstrated an understanding of the instructions.   The patient was advised to call back or seek an in-person evaluation/go to the ED if the symptoms worsen or if the condition fails to improve as anticipated.  I provided 15 minutes of face-to-face time during this encounter.   Sarah Bellman, MD [facility] [place]

## 2020-07-24 NOTE — Telephone Encounter (Signed)
Patient called the office for the address of the clinic. The patient also wanted to see if the cancer center has support groups for her family and children. Explained that we do and would give her the forms at her appt.

## 2020-07-27 ENCOUNTER — Telehealth: Payer: Self-pay | Admitting: General Practice

## 2020-07-27 NOTE — Telephone Encounter (Signed)
Patient called and left message on nurse voicemail line stating she forgot when her appt was on Monday 10/11 at the [facility]. Per chart review, patient has appt with GYN ONC on 10/12. Called patient, no answer- left message stating her appt was on 10/12 not 10/11 and included the time. Stated if she has more questions to call [phone].

## 2020-07-28 ENCOUNTER — Emergency Department (HOSPITAL_COMMUNITY)
Admission: EM | Admit: 2020-07-28 | Discharge: 2020-07-28 | Disposition: A | Discharge status: Home / Self Care | Payer: Medicaid Other | Attending: Emergency Medicine | Admitting: Emergency Medicine

## 2020-07-28 ENCOUNTER — Other Ambulatory Visit: Payer: Self-pay

## 2020-07-28 ENCOUNTER — Emergency Department (HOSPITAL_COMMUNITY): Payer: Medicaid Other

## 2020-07-28 ENCOUNTER — Encounter (HOSPITAL_COMMUNITY): Payer: Self-pay | Admitting: Emergency Medicine

## 2020-07-28 DIAGNOSIS — Z20822 Contact with and (suspected) exposure to covid-19: Secondary | ICD-10-CM | POA: Diagnosis not present

## 2020-07-28 DIAGNOSIS — Z79899 Other long term (current) drug therapy: Secondary | ICD-10-CM | POA: Insufficient documentation

## 2020-07-28 DIAGNOSIS — J4521 Mild intermittent asthma with (acute) exacerbation: Secondary | ICD-10-CM | POA: Insufficient documentation

## 2020-07-28 DIAGNOSIS — Z7982 Long term (current) use of aspirin: Secondary | ICD-10-CM | POA: Insufficient documentation

## 2020-07-28 DIAGNOSIS — Z8673 Personal history of transient ischemic attack (TIA), and cerebral infarction without residual deficits: Secondary | ICD-10-CM | POA: Diagnosis not present

## 2020-07-28 DIAGNOSIS — I10 Essential (primary) hypertension: Secondary | ICD-10-CM | POA: Diagnosis not present

## 2020-07-28 DIAGNOSIS — J4541 Moderate persistent asthma with (acute) exacerbation: Secondary | ICD-10-CM

## 2020-07-28 DIAGNOSIS — R0602 Shortness of breath: Secondary | ICD-10-CM | POA: Diagnosis present

## 2020-07-28 LAB — BASIC METABOLIC PANEL
Anion gap: 7 (ref 5–15)
BUN: 9 mg/dL (ref 6–20)
CO2: 23 mmol/L (ref 22–32)
Calcium: 9.3 mg/dL (ref 8.9–10.3)
Chloride: 106 mmol/L (ref 98–111)
Creatinine, Ser: 0.7 mg/dL (ref 0.44–1.00)
GFR calc non Af Amer: >60 mL/min (ref >60)
Glucose, Bld: 87 mg/dL (ref 70–99)
Potassium: 4.2 mmol/L (ref 3.5–5.1)
Sodium: 136 mmol/L (ref 135–145)

## 2020-07-28 LAB — CBC WITH DIFFERENTIAL/PLATELET
Abs Immature Granulocytes: 0.02 10*3/uL (ref 0.00–0.07)
Basophils Absolute: 0 10*3/uL (ref 0.0–0.1)
Basophils Relative: 0 %
Eosinophils Absolute: 0.1 10*3/uL (ref 0.0–0.5)
Eosinophils Relative: 1 %
HCT: 31.3 % — ABNORMAL LOW (ref 36.0–46.0)
Hemoglobin: 9 g/dL — ABNORMAL LOW (ref 12.0–15.0)
Immature Granulocytes: 0 %
Lymphocytes Relative: 22 %
Lymphs Abs: 2.1 10*3/uL (ref 0.7–4.0)
MCH: 20 pg — ABNORMAL LOW (ref 26.0–34.0)
MCHC: 28.8 g/dL — ABNORMAL LOW (ref 30.0–36.0)
MCV: 69.6 fL — ABNORMAL LOW (ref 80.0–100.0)
Monocytes Absolute: 0.4 10*3/uL (ref 0.1–1.0)
Monocytes Relative: 4 %
Neutro Abs: 6.7 10*3/uL (ref 1.7–7.7)
Neutrophils Relative %: 73 %
Platelets: 354 10*3/uL (ref 150–400)
RBC: 4.5 MIL/uL (ref 3.87–5.11)
RDW: 21.9 % — ABNORMAL HIGH (ref 11.5–15.5)
WBC: 9.4 10*3/uL (ref 4.0–10.5)
nRBC: 0 % (ref 0.0–0.2)

## 2020-07-28 LAB — D-DIMER, QUANTITATIVE: D-Dimer, Quant: 0.74 ug/mL-FEU — ABNORMAL HIGH (ref 0.00–0.50)

## 2020-07-28 LAB — RESPIRATORY PANEL BY RT PCR (FLU A&B, COVID)
Influenza A by PCR: NEGATIVE
Influenza B by PCR: NEGATIVE
SARS Coronavirus 2 by RT PCR: NEGATIVE

## 2020-07-28 LAB — TROPONIN I (HIGH SENSITIVITY)
Troponin I (High Sensitivity): 4 ng/L (ref <18)
Troponin I (High Sensitivity): 4 ng/L (ref <18)

## 2020-07-28 MED ORDER — IOHEXOL 350 MG/ML SOLN
100.0000 mL | Freq: Once | INTRAVENOUS | Status: AC | PRN
  Administered 2020-07-28: 100 mL via INTRAVENOUS

## 2020-07-28 MED ORDER — ALBUTEROL SULFATE HFA 108 (90 BASE) MCG/ACT IN AERS: 1.0000 | INHALATION_SPRAY | Freq: Four times a day (QID) | RESPIRATORY_TRACT | 0 refills | Status: AC | PRN

## 2020-07-28 MED ORDER — ALBUTEROL SULFATE HFA 108 (90 BASE) MCG/ACT IN AERS
2.0000 | INHALATION_SPRAY | Freq: Once | RESPIRATORY_TRACT | Status: AC
  Administered 2020-07-28: 2 via RESPIRATORY_TRACT
  Filled 2020-07-28: qty 6.7

## 2020-07-28 MED ORDER — PREDNISONE 50 MG PO TABS: 50.0000 mg | ORAL_TABLET | Freq: Every day | ORAL | 0 refills | Status: DC

## 2020-07-28 NOTE — ED Notes (Signed)
Pt transported to xray 

## 2020-07-28 NOTE — ED Notes (Signed)
Pt d/c home per MD order. Discharge summary reviewed with pt- pt verbalizes understanding. Ambulatory off unit. Reports d/c ride home. No s/s of acute distress noted.

## 2020-07-28 NOTE — ED Triage Notes (Addendum)
Patient complains of shortness of breath started over the weekend, then got worst last night. Denies chest pain, denies other complaints. Patient speaking in complete sentences. Ambulated from lobby to treatment room independently with steady gait. Patient alert, oriented, and in no apparent distress at this time. SpO2 100% on room air.

## 2020-07-28 NOTE — ED Provider Notes (Signed)
[facility] EMERGENCY DEPARTMENT Provider Note   CSN: [id number] Arrival date & time: 07/28/20  1000    History SOB  Sarah Bean is a 54 y.o. female with history significant for hypertension, asthma, obesity, recent endometrial cancer diagnosis who presents for evaluation of shortness of breath.  Patient with shortness of breath over the last 2 days.  Occasionally intermittent in nature however not exertional.  No associated chest pain or chest tightness.  She has not noticed any lower extremity swelling, redness or warmth.  No prior history of PE or DVT.  She was recently diagnosed with endometrial cancer.  Had had some abnormal vaginal bleeding however has been on hormone therapy which has stopped her vaginal bleeding.  She denies any headache, lightheadedness, dizziness, hemoptysis, abdominal pain, pelvic pain, rashes, lesions.  Denies additional aggravating or alleviating factors.  Does have remote history of asthma however has not been on steroids recently and does not have albuterol inhaler at home.  She is vaccinated against Covid.  No recent sick contacts.  Has had occasional dry cough over the last week.  No congestion, rhinorrhea, myalgias, diarrhea.  History obtained from patient and past medical records.  No interpreter is used.   COVID vaccinated   HPI     Past Medical History:  Diagnosis Date  . Asthma   . Essential hypertension   . Essential hypertension 05/26/2017  . Insomnia   . Manic depression ([place])   . Obesity   . Restless leg syndrome   . Stroke [facility])     Patient Active Problem List   Diagnosis Date Noted  . Abnormal uterine bleeding (AUB) 06/13/2020  . TIA (transient ischemic attack) 08/19/2019  . Morbid obesity ([place]) 08/19/2019  . Essential hypertension 05/26/2017  . Hyperglycemia 05/26/2017  . Microcytic anemia 05/26/2017  . CVA (cerebral vascular accident) ([place]) 05/25/2017    Past Surgical History:  Procedure Laterality Date  .  BREAST LUMPECTOMY Left   . CESAREAN SECTION     x3     OB History    Gravida  3   Para      Term      Preterm      AB      Living  3     SAB      TAB      Ectopic      Multiple      Live Births  3           Family History  Problem Relation Age of Onset  . CAD Mother 42  . Other Father 18       struck by lightning  . Stroke Neg Hx     Social History   Tobacco Use  . Smoking status: Never Smoker  . Smokeless tobacco: Never Used  Vaping Use  . Vaping Use: Never used  Substance Use Topics  . Alcohol use: No  . Drug use: No    Home Medications Prior to Admission medications   Medication Sig Start Date End Date Taking? Authorizing Provider  albuterol (VENTOLIN HFA) 108 (90 Base) MCG/ACT inhaler [name] 07/28/20   Demauri Advincula A, PA-C  aspirin EC 81 MG tablet Take 1 tablet (81 mg total) by mouth daily. Swallow whole. 06/13/20   Frann Rider, NP  atorvastatin (LIPITOR) 40 MG tablet Take 1 tablet (40 mg total) by mouth daily at 6  PM. 06/13/20   Frann Rider, NP  Elastic Bandages & Supports (FUTURO SOFT CERVICAL COLLAR) MISC 1 Device by Does not apply route daily as needed. 03/06/19   Harris, Vernie Shanks, PA-C  lisinopril-hydrochlorothiazide (ZESTORETIC) 20-25 MG tablet [name] 06/06/20   [provider]  megestrol (MEGACE) 40 MG tablet Take 1 tablet (40 mg total) by mouth daily. Can increase to two tablets twice a day in the event of heavy bleeding 07/17/20   Constant, Peggy, MD  methocarbamol (ROBAXIN) 500 MG tablet Take 1 tablet (500 mg total) by mouth 3 (three) times daily as needed for muscle spasms. Patient not taking: Reported on 07/17/2020 03/06/19   Margarita Mail, PA-C  predniSONE (DELTASONE) 50 MG tablet Take 1 tablet (50 mg total) by mouth daily. 07/28/20   Johnattan Strassman A, PA-C  QUEtiapine (SEROQUEL) 100 MG tablet Take 100 mg by  mouth at bedtime.    [provider]  sertraline (ZOLOFT) 100 MG tablet [name] 06/09/20   [provider]  traZODone (DESYREL) 100 MG tablet [name]     [provider]    Allergies    Patient has no known allergies.  Review of Systems   Review of Systems  Constitutional: Negative.   HENT: Negative.  Negative for congestion.   Respiratory: Positive for cough and shortness of breath. Negative for apnea, choking, chest tightness, wheezing and stridor.   Cardiovascular: Negative.   Gastrointestinal: Negative.   Genitourinary: Negative.   Musculoskeletal: Negative.   Skin: Negative.   Neurological: Negative.   All other systems reviewed and are negative.   Physical Exam Updated Vital Signs BP (!) 144/92   Pulse 71   Temp 97.8 F (36.6 C) (Oral)   Resp 20   Ht 5\' 2"  (1.575 m)   Wt 113.4 kg   SpO2 100%   BMI 45.73 kg/m   Physical Exam Vitals and nursing note reviewed.  Constitutional:      General: She is not in acute distress.    Appearance: She is well-developed. She is obese. She is not ill-appearing, toxic-appearing or diaphoretic.  HENT:     Head: Normocephalic and atraumatic.     Mouth/Throat:     Mouth: Mucous membranes are moist.  Eyes:     Pupils: Pupils are equal, round, and reactive to light.  Cardiovascular:     Rate and Rhythm: Normal rate.     Pulses: Normal pulses.     Heart sounds: Normal heart sounds.  Pulmonary:     Effort: Pulmonary effort is normal. No respiratory distress.     Comments: Possible faint wheeze however speaks in full sentences. Chest:     Comments: No crepitus, step-offs.  Equal rise and fall to chest Abdominal:     General: Bowel sounds are normal. There is no distension.     Palpations: Abdomen is soft.     Comments: Soft, nontender without rebound or guarding.  Musculoskeletal:        General: Normal range of motion.     Cervical back: Normal range of  motion.     Right lower leg: No tenderness. No edema.     Left lower leg: No tenderness. No edema.     Comments: Moves all 4 extremities without difficulty.  No edema.  Compartments soft.  Bevelyn Buckles' sign negative.  Skin:    General: Skin is warm and dry.     Comments: No edema,  erythema or warmth.  No fluctuance or induration.  No rashes or lesions  Neurological:     General: No focal deficit present.     Mental Status: She is alert and oriented to person, place, and time.     Comments: Cranial nerves II through XII grossly intact Intact sensation Ambulatory from waiting room to treatment room without difficulty     ED Results / Procedures / Treatments   Labs (all labs ordered are listed, but only abnormal results are displayed) Labs Reviewed  CBC WITH DIFFERENTIAL/PLATELET - Abnormal; Notable for the following components:      Result Value   Hemoglobin 9.0 (*)    HCT 31.3 (*)    MCV 69.6 (*)    MCH 20.0 (*)    MCHC 28.8 (*)    RDW 21.9 (*)    All other components within normal limits  D-DIMER, QUANTITATIVE (NOT AT [facility]) - Abnormal; Notable for the following components:   D-Dimer, Quant 0.74 (*)    All other components within normal limits  RESPIRATORY PANEL BY RT PCR (FLU A&B, COVID)  BASIC METABOLIC PANEL  TROPONIN I (HIGH SENSITIVITY)  TROPONIN I (HIGH SENSITIVITY)    EKG EKG Interpretation  Date/Time:  Friday July 28 2020 10:12:11 EDT Ventricular Rate:  79 PR Interval:    QRS Duration: 81 QT Interval:  361 QTC Calculation: 414 R Axis:   81 Text Interpretation: Sinus rhythm no acute ST/T changes ST/T changes from 2020 improved Confirmed by Sherwood Gambler [phone]) on 07/28/2020 10:18:36 AM   Radiology DG Chest 2 View  Result Date: 07/28/2020 CLINICAL DATA:  Shortness of breath EXAM: CHEST - 2 VIEW COMPARISON:  02/10/2020 FINDINGS: Normal heart size and mediastinal contours. No acute infiltrate or edema. No effusion or pneumothorax. No acute osseous findings.  Artifact from EKG leads. IMPRESSION: No active cardiopulmonary disease. Electronically Signed   By: Monte Fantasia M.D.   On: 07/28/2020 10:46   CT Angio Chest PE W/Cm &/Or Wo Cm  Result Date: 07/28/2020 CLINICAL DATA:  Chest pain and shortness of breath. EXAM: CT ANGIOGRAPHY CHEST WITH CONTRAST TECHNIQUE: Multidetector CT imaging of the chest was performed using the standard protocol during bolus administration of intravenous contrast. Multiplanar CT image reconstructions and MIPs were obtained to evaluate the vascular anatomy. CONTRAST:  121mL OMNIPAQUE IOHEXOL 350 MG/ML SOLN COMPARISON:  03/04/2018 FINDINGS: Cardiovascular: The heart is normal in size. No pericardial effusion. The aorta is normal in caliber. No dissection. Minimal atherosclerotic calcifications. The branch vessels are patent. No definite coronary artery calcifications. The pulmonary arterial tree is suboptimally opacified. No definite filling defects to suggest pulmonary embolism. Mediastinum/Nodes: No mediastinal or hilar mass or lymphadenopathy. Small scattered lymph nodes are stable. The esophagus is grossly normal. There is a small hiatal hernia noted. Lungs/Pleura: Breathing motion artifact. No definite pulmonary infiltrates or pleural effusions. No worrisome pulmonary lesions. Upper Abdomen: No significant upper abdominal findings. Gallstones are again noted the gallbladder. Musculoskeletal: No breast masses, supraclavicular or axillary adenopathy. The thyroid gland is grossly normal. The bony thorax is intact. Review of the MIP images confirms the above findings. IMPRESSION: 1. No CT findings for pulmonary embolism. 2. Normal thoracic aorta. 3. No acute pulmonary findings or worrisome pulmonary lesions. 4. Cholelithiasis. 5. Aortic atherosclerosis. Aortic Atherosclerosis (ICD10-I70.0). Electronically Signed   By: Marijo Sanes M.D.   On: 07/28/2020 13:55    Procedures Procedures (including critical care time)  Medications  Ordered in ED Medications  albuterol (VENTOLIN HFA) 108 (90 Base) MCG/ACT  inhaler 2 puff (2 puffs Inhalation Given 07/28/20 1112)  iohexol (OMNIPAQUE) 350 MG/ML injection 100 mL (100 mLs Intravenous Contrast Given 07/28/20 1313)    ED Course  I have reviewed the triage vital signs and the nursing notes.  Pertinent labs & imaging results that were available during my care of the patient were reviewed by me and considered in my medical decision making (see chart for details).  53 year old female, recent diagnosis of endometrial cancer, on exogenous hormone therapy presents for evaluation of shortness of breath which began yesterday.  No associated chest pain.  Has had nonproductive cough x1 week.  She is Covid vaccinated.  No lower extremity edema, erythema or warmth.  Bevelyn Buckles' sign negative.  Possible very faint expiratory wheeze however speaks in full sentences.  She has no tachycardia, tachypnea or hypoxia.  Her abdomen is soft, nontender.  She has no clinical evidence of DVT on exam.  No acute respiratory distress.  Will get labs, EKG, chest x-ray, albuterol and reassess.  Patient ambulatory from waiting room to treatment room with oxygen saturation 100% on room air.  Labs and imaging personally reviewed and interpreted:  CBC no leukocytosis, hemoglobin 9.0, up from previous BMP without electrolyte, renal or liver normality Trop flat at 4 D-dimer 0.74, CTA chest ordered to rule out PE EKG without ischemic changes DG chest without cardiomegaly, pulmonary edema, pneumothorax, bacterial infectious process CTA chest without PE.  Patient reassessed.  Does improvement after albuterol.  Her D-dimer was elevated.  Awaiting CTA chest  Patient reassessed.  She is ambulatory without any hypoxia.  Lab work and imaging reassuring.  Question acute asthma exacerbation.  Discussed symptomatic management at home.  I have low suspicion for ACS, PE, dissection, bacterial infectious process, pneumothorax,  metabolic derangement. Without chest pain here in ED.  The patient has been appropriately medically screened and/or stabilized in the ED. I have low suspicion for any other emergent medical condition which would require further screening, evaluation or treatment in the ED or require inpatient management.  Patient is hemodynamically stable and in no acute distress.  Patient able to ambulate in department prior to ED.  Evaluation does not show acute pathology that would require ongoing or additional emergent interventions while in the emergency department or further inpatient treatment.  I have discussed the diagnosis with the patient and answered all questions.  Pain is been managed while in the emergency department and patient has no further complaints prior to discharge.  Patient is comfortable with plan discussed in room and is stable for discharge at this time.  I have discussed strict return precautions for returning to the emergency department.  Patient was encouraged to follow-up with PCP/specialist refer to at discharge.    MDM Rules/Calculators/A&P                          Sarah Bean was evaluated in Emergency Department on 07/28/2020 for the symptoms described in the history of present illness. She was evaluated in the context of the global COVID-19 pandemic, which necessitated consideration that the patient might be at risk for infection with the SARS-CoV-2 virus that causes COVID-19. Institutional protocols and algorithms that pertain to the evaluation of patients at risk for COVID-19 are in a state of rapid change based on information released by regulatory bodies including the CDC and federal and state organizations. These policies and algorithms were followed during the patient's care in the ED. Final Clinical Impression(s) / ED  Diagnoses Final diagnoses:  Moderate persistent asthma with exacerbation    Rx / DC Orders ED Discharge Orders         Ordered    predniSONE (DELTASONE)  50 MG tablet  Daily        07/28/20 1442    albuterol (VENTOLIN HFA) 108 (90 Base) MCG/ACT inhaler  Every 6 hours PRN        07/28/20 1442           Dimonique Bourdeau A, PA-C 07/28/20 1450    Sherwood Gambler, MD 08/01/20 [phone]

## 2020-07-28 NOTE — Discharge Instructions (Signed)
Take the medications as prescribed.  CT of your chest did not show any evidence of enlarged heart, fluid in your lungs, blood clot.  Your lab work was reassuring without any evidence of damage to your heart.  Covid test was negative  Return for new or worsening symptoms.

## 2020-07-31 ENCOUNTER — Encounter: Payer: Self-pay | Admitting: Gynecologic Oncology

## 2020-08-01 ENCOUNTER — Other Ambulatory Visit: Payer: Self-pay

## 2020-08-01 ENCOUNTER — Inpatient Hospital Stay: Payer: Medicaid Other | Attending: Gynecologic Oncology

## 2020-08-01 ENCOUNTER — Encounter: Payer: Self-pay | Admitting: Gynecologic Oncology

## 2020-08-01 ENCOUNTER — Ambulatory Visit: Payer: Self-pay | Admitting: Neurology

## 2020-08-01 ENCOUNTER — Inpatient Hospital Stay (HOSPITAL_BASED_OUTPATIENT_CLINIC_OR_DEPARTMENT_OTHER): Payer: Medicaid Other | Admitting: Gynecologic Oncology

## 2020-08-01 VITALS — BP 132/65 | HR 86 | Temp 98.1°F | Resp 18 | Ht 62.0 in | Wt 253.0 lb

## 2020-08-01 DIAGNOSIS — C541 Malignant neoplasm of endometrium: Secondary | ICD-10-CM | POA: Diagnosis not present

## 2020-08-01 DIAGNOSIS — G2581 Restless legs syndrome: Secondary | ICD-10-CM | POA: Diagnosis not present

## 2020-08-01 DIAGNOSIS — Z90722 Acquired absence of ovaries, bilateral: Secondary | ICD-10-CM | POA: Diagnosis not present

## 2020-08-01 DIAGNOSIS — G4700 Insomnia, unspecified: Secondary | ICD-10-CM | POA: Diagnosis not present

## 2020-08-01 DIAGNOSIS — F319 Bipolar disorder, unspecified: Secondary | ICD-10-CM | POA: Insufficient documentation

## 2020-08-01 DIAGNOSIS — G4733 Obstructive sleep apnea (adult) (pediatric): Secondary | ICD-10-CM | POA: Diagnosis not present

## 2020-08-01 DIAGNOSIS — Z7982 Long term (current) use of aspirin: Secondary | ICD-10-CM | POA: Insufficient documentation

## 2020-08-01 DIAGNOSIS — F419 Anxiety disorder, unspecified: Secondary | ICD-10-CM | POA: Diagnosis not present

## 2020-08-01 DIAGNOSIS — I10 Essential (primary) hypertension: Secondary | ICD-10-CM | POA: Diagnosis not present

## 2020-08-01 DIAGNOSIS — J45909 Unspecified asthma, uncomplicated: Secondary | ICD-10-CM | POA: Diagnosis not present

## 2020-08-01 DIAGNOSIS — Z79899 Other long term (current) drug therapy: Secondary | ICD-10-CM | POA: Insufficient documentation

## 2020-08-01 DIAGNOSIS — Z791 Long term (current) use of non-steroidal anti-inflammatories (NSAID): Secondary | ICD-10-CM | POA: Insufficient documentation

## 2020-08-01 DIAGNOSIS — Z7952 Long term (current) use of systemic steroids: Secondary | ICD-10-CM | POA: Diagnosis not present

## 2020-08-01 LAB — SURGICAL PATHOLOGY

## 2020-08-01 LAB — HEMOGLOBIN A1C
Hgb A1c MFr Bld: 5.8 % — ABNORMAL HIGH (ref 4.8–5.6)
Mean Plasma Glucose: 119.76 mg/dL

## 2020-08-01 MED ORDER — IBUPROFEN 800 MG PO TABS: 800.0000 mg | ORAL_TABLET | Freq: Three times a day (TID) | ORAL | 0 refills | Status: DC | PRN

## 2020-08-01 MED ORDER — SENNOSIDES-DOCUSATE SODIUM 8.6-50 MG PO TABS: 2.0000 | ORAL_TABLET | Freq: Every day | ORAL | 0 refills | Status: DC

## 2020-08-01 MED ORDER — TRAMADOL HCL 50 MG PO TABS: 50.0000 mg | ORAL_TABLET | Freq: Four times a day (QID) | ORAL | 0 refills | Status: DC | PRN

## 2020-08-01 NOTE — Patient Instructions (Addendum)
We will check a hemoglobin A1C today and contact you with the results. This lab shows a picture of how your blood sugar has been running over the past 3 months.  Preparing for your Surgery  Plan for surgery on August 29, 2020 with Dr. Everitt Amber at [facility] will be scheduled for a robotic assisted total laparoscopic hysterectomy (removal of the uterus and cervix), bilateral salpingo-oophorectomy (removal of both ovaries and fallopian tubes), sentinel lymph node biopsy, possible lymph node dissection.   Pre-operative Testing -You will receive a phone call from presurgical testing at [facility] to arrange for a pre-operative appointment over the phone, lab appointment, and COVID test. The COVID test normally happens 3 days prior to the surgery and they ask that you self quarantine after the test up until surgery to decrease chance of exposure.  -Bring your insurance card, copy of an advanced directive if applicable, medication list  -At that visit, you will be asked to sign a consent for a possible blood transfusion in case a transfusion becomes necessary during surgery.  The need for a blood transfusion is rare but having consent is a necessary part of your care.     -You should not be taking blood thinners or aspirin at least ten days prior to surgery unless instructed by your surgeon. STOP TAKING YOUR ASPIRIN NOW.  -Do not take supplements such as fish oil (omega 3), red yeast rice, turmeric before your surgery.   Day Before Surgery at [place] will be asked to take in a light diet the day before surgery. You will be advised you can have clear liquids up until 3 hours before your surgery.    Eat a light diet the day before surgery.  Examples including soups, broths, toast, yogurt, mashed potatoes.  AVOID GAS PRODUCING FOODS. Things to avoid include carbonated beverages (fizzy beverages), raw fruits and raw vegetables, or beans.   If your bowels are filled with gas,  your surgeon will have difficulty visualizing your pelvic organs which increases your surgical risks.  Your role in recovery Your role is to become active as soon as directed by your doctor, while still giving yourself time to heal.  Rest when you feel tired. You will be asked to do the following in order to speed your recovery:  - Cough and breathe deeply. This helps to clear and expand your lungs and can prevent pneumonia after surgery.  - Enigma. Do mild physical activity. Walking or moving your legs help your circulation and body functions return to normal. Do not try to get up or walk alone the first time after surgery.   -If you develop swelling on one leg or the other, pain in the back of your leg, redness/warmth in one of your legs, please call the office or go to the Emergency Room to have a doppler to rule out a blood clot. For shortness of breath, chest pain-seek care in the Emergency Room as soon as possible. - Actively manage your pain. Managing your pain lets you move in comfort. We will ask you to rate your pain on a scale of zero to 10. It is your responsibility to tell your doctor or nurse where and how much you hurt so your pain can be treated.  Special Considerations -If you are diabetic, you may be placed on insulin after surgery to have closer control over your blood sugars to promote healing and recovery.  This does not  mean that you will be discharged on insulin.  If applicable, your oral antidiabetics will be resumed when you are tolerating a solid diet.  -Your final pathology results from surgery should be available around one week after surgery and the results will be relayed to you when available.  -Dr. Lahoma Crocker is the surgeon that assists your GYN Oncologist with surgery.  If you end up staying the night, the next day after your surgery you will either see Dr. Denman George, Dr. Berline Lopes, or Dr. Lahoma Crocker.  -FMLA forms can be faxed to  [phone] and please allow 5-7 business days for completion.  Pain Management After Surgery -You have been prescribed your pain medication and bowel regimen medications before surgery so that you can have these available when you are discharged from the hospital. The pain medication is for use ONLY AFTER surgery and a new prescription will not be given.   -Make sure that you have Tylenol and Ibuprofen at home to use on a regular basis after surgery for pain control. We recommend alternating the medications every hour to six hours since they work differently and are processed in the body differently for pain relief.  -Review the attached handout on narcotic use and their risks and side effects.   Bowel Regimen -You have been prescribed Sennakot-S to take nightly to prevent constipation especially if you are taking the narcotic pain medication intermittently.  It is important to prevent constipation and drink adequate amounts of liquids. You can stop taking this medication when you are not taking pain medication and you are back on your normal bowel routine.  Risks of Surgery Risks of surgery are low but include bleeding, infection, damage to surrounding structures, re-operation, blood clots, and very rarely death.   Blood Transfusion Information (For the consent to be signed before surgery)  We will be checking your blood type before surgery so in case of emergencies, we will know what type of blood you would need.                                            WHAT IS A BLOOD TRANSFUSION?  A transfusion is the replacement of blood or some of its parts. Blood is made up of multiple cells which provide different functions.  Red blood cells carry oxygen and are used for blood loss replacement.  White blood cells fight against infection.  Platelets control bleeding.  Plasma helps clot blood.  Other blood products are available for specialized needs, such as hemophilia or other clotting  disorders. BEFORE THE TRANSFUSION  Who gives blood for transfusions?   You may be able to donate blood to be used at a later date on yourself (autologous donation).  Relatives can be asked to donate blood. This is generally not any safer than if you have received blood from a stranger. The same precautions are taken to ensure safety when a relative's blood is donated.  Healthy volunteers who are fully evaluated to make sure their blood is safe. This is blood bank blood. Transfusion therapy is the safest it has ever been in the practice of medicine. Before blood is taken from a donor, a complete history is taken to make sure that person has no history of diseases nor engages in risky social behavior (examples are intravenous drug use or sexual activity with multiple partners). The donor's travel history is screened  to minimize risk of transmitting infections, such as malaria. The donated blood is tested for signs of infectious diseases, such as HIV and hepatitis. The blood is then tested to be sure it is compatible with you in order to minimize the chance of a transfusion reaction. If you or a relative donates blood, this is often done in anticipation of surgery and is not appropriate for emergency situations. It takes many days to process the donated blood. RISKS AND COMPLICATIONS Although transfusion therapy is very safe and saves many lives, the main dangers of transfusion include:   Getting an infectious disease.  Developing a transfusion reaction. This is an allergic reaction to something in the blood you were given. Every precaution is taken to prevent this. The decision to have a blood transfusion has been considered carefully by your caregiver before blood is given. Blood is not given unless the benefits outweigh the risks.  AFTER SURGERY INSTRUCTIONS  Return to work: 4 weeks if applicable  Activity: 1. Be up and out of the bed during the day.  Take a nap if needed.  You may walk up  steps but be careful and use the hand rail.  Stair climbing will tire you more than you think, you may need to stop part way and rest.   2. No lifting or straining for 6 weeks over 10 pounds. No pushing, pulling, straining for 6 weeks.  3. No driving for 1 week(s).  Do not drive if you are taking narcotic pain medicine and make sure that your reaction time has returned.   4. You can shower as soon as the next day after surgery. Shower daily.  Use your regular soap and water (not directly on the incision) and pat your incision(s) dry afterwards; don't rub.  No tub baths or submerging your body in water until cleared by your surgeon. If you have the soap that was given to you by pre-surgical testing that was used before surgery, you do not need to use it afterwards because this can irritate your incisions.   5. No sexual activity and nothing in the vagina for 8 weeks.  6. You may experience a small amount of clear drainage from your incisions, which is normal.  If the drainage persists, increases, or changes color please call the office.  7. Do not use creams, lotions, or ointments such as neosporin on your incisions after surgery until advised by your surgeon because they can cause removal of the dermabond glue on your incisions.    8. You may experience vaginal spotting after surgery or around the 6-8 week mark from surgery when the stitches at the top of the vagina begin to dissolve.  The spotting is normal but if you experience heavy bleeding, call our office.  9. Take Tylenol or ibuprofen first for pain and only use narcotic pain medication for severe pain not relieved by the Tylenol or Ibuprofen.  Monitor your Tylenol intake to a max of 4,000 mg in a 24 hour period. You can alternate these medications after surgery.  Diet: 1. Low sodium Heart Healthy Diet is recommended but you are cleared to resume your normal (before surgery) diet after your procedure.  2. It is safe to use a laxative,  such as Miralax or Colace, if you have difficulty moving your bowels. You have been prescribed Sennakot at bedtime every evening to keep bowel movements regular and to prevent constipation.    Wound Care: 1. Keep clean and dry.  Shower daily.  Reasons to call the Doctor:  Fever - Oral temperature greater than 100.4 degrees Fahrenheit  Foul-smelling vaginal discharge  Difficulty urinating  Nausea and vomiting  Increased pain at the site of the incision that is unrelieved with pain medicine.  Difficulty breathing with or without chest pain  New calf pain especially if only on one side  Sudden, continuing increased vaginal bleeding with or without clots.   Contacts: For questions or concerns you should contact:  Dr. Everitt Amber at [phone]  Joylene John, NP at [phone]  After Hours: call [phone] and have the GYN Oncologist paged/contacted (after 5 pm or on the weekends)

## 2020-08-01 NOTE — Progress Notes (Signed)
Consult Note: Gyn-Onc  Consult was requested by Dr. [name] for the evaluation of Sarah Bean 54 y.o. female  CC:  Chief Complaint  Patient presents with   Endometrial cancer    Assessment/Plan:  Sarah Bean  is a 54 y.o.  year old P3 with grade 1 endometrial cancer.  A detailed discussion was held with the patient with regard to to her endometrial cancer diagnosis. We discussed the standard management options for uterine cancer which includes surgery followed possibly by adjuvant therapy depending on the results of surgery. The surgical management include a robotic assisted total hysterectomy >250gm and removal of the tubes and ovaries with sampling of lymph nodes and minilaparotomy for specimen delivery. If a minimally invasive approach is not feasible, a laparotomy may be necessary (including for specimen delivery). The patient has been counseled about these surgical options and the risks of surgery in general including infection, bleeding, damage to surrounding structures (including bowel, bladder, ureters, nerves or vessels), and the postoperative risks of PE/ DVT, and lymphedema. After counseling and consideration of her options, she is in agreement to proceed with robotic assisted total hysterectomy with bilateral sapingo-oophorectomy and SLN biopsy.   I explained to her that her morbid obesity places her at substantially high risk for complications including wound healing and organ injury.  I explained that there was a medical alternatives available for treatment with progesterone that would avoid the surgical risks or complications.  She elected to proceed with surgery.  I am recommending she discontinue aspirin 81 mg 10 days preoperatively.  She can resume aspirin postoperatively if there are no bleeding complications intraoperatively.  She will be seen by anesthesia for preoperative clearance and discussion of postoperative pain management.  She was given the  opportunity to ask questions, which were answered to her satisfaction, and she is agreement with the above mentioned plan of care.   We explained that robotic hysterectomy is typically an outpatient procedure with same day discharge provided that she is meeting appropriate discharge criteria from the PACU. We provided extensive counseling regarding post-operative expectations for recovery and restrictions/limitations. We provided information regarding multi-modal pain therapy and the importance of avoidance of opioids. She is disabled and therefore does not need time off of work.  We explained that after surgery we will review her definitive pathology and determine if adjuvant therapy is recommended.   HPI: Sarah Bean is a 54 year old P 3 who was seen in consultation at the request of Dr [name] for evaluation and treatment of grade 2 endometrial cancer.   Her symptoms began approximately 10 years prior to diagnosis with abnormal uterine bleeding.  [name]  When her symptoms escalated with respect to the volume of bleeding she sought evaluation with Dr. Elly Modena.  She saw Dr. Elly Modena for abnormal uterine bleeding on 06/22/2020.  Work-up of symptoms included a transvaginal ultrasound scan and endometrial biopsy. Transvaginal US on June 22, 2020 showed a uterus measuring 14.9 x 12.3 x 10.5 cm.  There were multiple fibroids within the uterus including a partially exophytic posterior uterine wall fibroid measuring 8 cm.  The endometrium was thick at 19 mm.  The left and right ovaries were not seen. 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   The patient is morbidly obese with a BMI of  46 kg meters squared.  She last had her hemoglobin A1c checked in 2020 and was normal at that time.   She has diagnosed hypertension, manic depression, obstructive sleep apnea for which she uses a CPAP device, and had a stroke in 2018 that sounds like it was a TIA with complete resolution of neurologic symptoms.  She is treated with 81 mg of aspirin for this.  Past surgical history is most remarkable for 3 prior cesarean sections.  Current Meds:  Outpatient Encounter Medications as of 08/01/2020  Medication Sig   albuterol (VENTOLIN HFA) 108 (90 Base) MCG/ACT inhaler [name]   aspirin EC 81 MG tablet Take 1 tablet (81 mg total) by mouth daily. Swallow whole.   lisinopril-hydrochlorothiazide (ZESTORETIC) 20-25 MG tablet [name]   megestrol (MEGACE) 40 MG tablet Take 1 tablet (40 mg total) by mouth daily. Can increase to two tablets twice a day in the event of heavy bleeding   QUEtiapine (SEROQUEL) 100 MG tablet Take 100 mg by mouth at bedtime.   sertraline (ZOLOFT) 100 MG tablet [name]   traZODone (DESYREL) 100 MG tablet Take 100-200 mg by mouth at bedtime.    atorvastatin (LIPITOR) 40 MG tablet Take 1 tablet (40 mg total) by mouth daily at 6 PM. (Patient not taking: Reported on 07/31/2020)   ibuprofen (ADVIL) 800 MG tablet [name]   senna-docusate (SENOKOT-S) 8.6-50 MG tablet Take 2 tablets by mouth at bedtime. For AFTER surgery, do not take if having diarrhea   traMADol (ULTRAM) 50 MG tablet Take 1 tablet (50 mg total) by mouth every 6 (six) hours as needed for severe pain. For AFTER surgery only, do not take and drive   [DISCONTINUED] Elastic Bandages & Supports (FUTURO SOFT CERVICAL COLLAR) MISC 1 Device by Does not apply route daily as needed.   [DISCONTINUED] methocarbamol (ROBAXIN) 500 MG tablet Take 1 tablet (500 mg total) by mouth 3 (three)  times daily as needed for muscle spasms. (Patient not taking: Reported on 07/17/2020)   [DISCONTINUED] predniSONE (DELTASONE) 50 MG tablet Take 1 tablet (50 mg total) by mouth daily.   No facility-administered encounter medications on file as of 08/01/2020.    Allergy: No Known Allergies  Social Hx:   Social History   Socioeconomic History   Marital status: Single    Spouse name: Not on file   Number of children: Not on file   Years of education: Not on file   Highest education level: Not on file  Occupational History   Occupation: disabled  Tobacco Use   Smoking status: Never Smoker   Smokeless tobacco: Never Used  Scientific laboratory technician Use: Never used  Substance and Sexual Activity   Alcohol use: Yes    Comment: very seldom   Drug use: Never   Sexual activity: Not Currently    Partners: Male    Birth control/protection: None, [name], who has autism. He vocalizes and acts out.   Social Determinants of Health   Financial Resource Strain:    Difficulty of Paying Living Expenses: Not on file  Food Insecurity:    Worried About  Running Out of Food in the Last Year: Not on file   Ran Out of Food in the Last Year: Not on file  Transportation Needs:    Lack of Transportation (Medical): Not on file   Lack of Transportation (Non-Medical): Not on file  Physical Activity:    Days of Exercise per Week: Not on file   Minutes of Exercise per Session: Not on file  Stress:    Feeling of Stress : Not on file  Social Connections:    Frequency of Communication with Friends and Family: Not on file   Frequency of Social Gatherings with Friends and Family: Not on file   Attends Religious Services: Not on file   Active Member of Clubs or Organizations: Not on file   Attends Archivist Meetings: Not on file   Marital Status: Not on file  Intimate  Partner Violence:    Fear of Current or Ex-Partner: Not on file   Emotionally Abused: Not on file   Physically Abused: Not on file   Sexually Abused: Not on file    Past Surgical Hx:  Past Surgical History:  Procedure Laterality Date   BREAST LUMPECTOMY Left    CESAREAN SECTION     x3   TUBAL LIGATION Bilateral 1990    Past Medical Hx:  Past Medical History:  Diagnosis Date   Anxiety    Asthma    Depression    Essential hypertension    Essential hypertension 05/26/2017   Insomnia    Manic depression ([place])    Obesity    Restless leg syndrome    Stroke [facility])    Uterine cancer ([place])     Past Gynecological History:  Pap in 2021 normal and negative high risk HPV No LMP recorded.  Family Hx:  Family History  Problem Relation Age of Onset   CAD Mother 17   Seizures Mother    Other Father 29       struck by lightning   Seizures Brother    Cancer Maternal Aunt        lung   Cancer Maternal Grandmother        breast   Cancer Paternal Grandmother        cervical   Multiple sclerosis Daughter    Autism Son    Stroke Neg Hx     Review of Systems:  Constitutional  Feels well,    ENT Normal appearing ears and nares bilaterally Skin/Breast  No rash, sores, jaundice, itching, dryness Cardiovascular  No chest pain, shortness of breath, or edema  Pulmonary  No cough or wheeze.  Gastro Intestinal  No nausea, vomitting, or diarrhoea. No bright red blood per rectum, no abdominal pain, change in bowel movement, or constipation.  Genito Urinary  No frequency, urgency, dysuria, + postmenopausal bleeding Musculo Skeletal  No myalgia, arthralgia, joint swelling or pain  Neurologic  No weakness, numbness, change in gait,  Psychology  No depression, anxiety, insomnia.   Vitals:  Blood pressure 132/65, pulse 86, temperature 98.1 F (36.7 C), temperature source Tympanic, resp. rate 18, height 5\' 2"  (1.575 m), weight 253 lb (114.8 kg), SpO2 99  %.  Physical Exam: WD in NAD Neck  Supple NROM, without any enlargements.  Lymph Node Survey No cervical supraclavicular or inguinal adenopathy Cardiovascular  Pulse normal rate, regularity and rhythm. S1 and S2 normal.  Lungs  Clear to auscultation bilateraly, without wheezes/crackles/rhonchi. Good air movement.  Skin  No rash/lesions/breakdown  Psychiatry  Alert and oriented  to person, place, and time  Abdomen  Normoactive bowel sounds, abdomen soft, non-tender and obese, + pannus, without evidence of hernia.  Back No CVA tenderness Genito Urinary  Vulva/vagina: Normal external female genitalia.   No lesions. No discharge or bleeding.  Bladder/urethra:  No lesions or masses, well supported bladder  Vagina: normal  Cervix: Normal appearing, no lesions.  Uterus: Bulky, mobile, no parametrial involvement or nodularity.  Adnexa: no palpable masses. Rectal  deferred Extremities  No bilateral cyanosis, clubbing or edema.  60 minutes of total time was spent for this patient encounter, including preparation, face-to-face counseling with the patient and coordination of care, and documentation of the encounter.   Thereasa Solo, MD  08/01/2020, 12:23 PM

## 2020-08-01 NOTE — H&P (View-Only) (Signed)
Consult Note: Gyn-Onc  Consult was requested by Dr. [name] for the evaluation of Sarah Bean 54 y.o. female  CC:  Chief Complaint  Patient presents with  . Endometrial cancer    Assessment/Plan:  Sarah Bean  is a 54 y.o.  year old P3 with grade 1 endometrial cancer.  A detailed discussion was held with the patient with regard to to her endometrial cancer diagnosis. We discussed the standard management options for uterine cancer which includes surgery followed possibly by adjuvant therapy depending on the results of surgery. The surgical management include a robotic assisted total hysterectomy >250gm and removal of the tubes and ovaries with sampling of lymph nodes and minilaparotomy for specimen delivery. If a minimally invasive approach is not feasible, a laparotomy may be necessary (including for specimen delivery). The patient has been counseled about these surgical options and the risks of surgery in general including infection, bleeding, damage to surrounding structures (including bowel, bladder, ureters, nerves or vessels), and the postoperative risks of PE/ DVT, and lymphedema. After counseling and consideration of her options, she is in agreement to proceed with robotic assisted total hysterectomy with bilateral sapingo-oophorectomy and SLN biopsy.   I explained to her that her morbid obesity places her at substantially high risk for complications including wound healing and organ injury.  I explained that there was a medical alternatives available for treatment with progesterone that would avoid the surgical risks or complications.  She elected to proceed with surgery.  I am recommending she discontinue aspirin 81 mg 10 days preoperatively.  She can resume aspirin postoperatively if there are no bleeding complications intraoperatively.  She will be seen by anesthesia for preoperative clearance and discussion of postoperative pain management.  She was given the  opportunity to ask questions, which were answered to her satisfaction, and she is agreement with the above mentioned plan of care.   We explained that robotic hysterectomy is typically an outpatient procedure with same day discharge provided that she is meeting appropriate discharge criteria from the PACU. We provided extensive counseling regarding post-operative expectations for recovery and restrictions/limitations. We provided information regarding multi-modal pain therapy and the importance of avoidance of opioids. She is disabled and therefore does not need time off of work.  We explained that after surgery we will review her definitive pathology and determine if adjuvant therapy is recommended.   HPI: Sarah Bean is a 54 year old P 3 who was seen in consultation at the request of Dr [name] for evaluation and treatment of grade 2 endometrial cancer.   Her symptoms began approximately 10 years prior to diagnosis with abnormal uterine bleeding.  [name]  When her symptoms escalated with respect to the volume of bleeding she sought evaluation with Dr. Elly Modena.  She saw Dr. Elly Modena for abnormal uterine bleeding on 06/22/2020.  Work-up of symptoms included a transvaginal ultrasound scan and endometrial biopsy. Transvaginal US on June 22, 2020 showed a uterus measuring 14.9 x 12.3 x 10.5 cm.  There were multiple fibroids within the uterus including a partially exophytic posterior uterine wall fibroid measuring 8 cm.  The endometrium was thick at 19 mm.  The left and right ovaries were not seen. 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   The patient is morbidly obese with a BMI of  46 kg meters squared.  She last had her hemoglobin A1c checked in 2020 and was normal at that time.   She has diagnosed hypertension, manic depression, obstructive sleep apnea for which she uses a CPAP device, and had a stroke in 2018 that sounds like it was a TIA with complete resolution of neurologic symptoms.  She is treated with 81 mg of aspirin for this.  Past surgical history is most remarkable for 3 prior cesarean sections.  Current Meds:  Outpatient Encounter Medications as of 08/01/2020  Medication Sig  . albuterol (VENTOLIN HFA) 108 (90 Base) MCG/ACT inhaler [name]  Marland Kitchen aspirin EC 81 MG tablet Take 1 tablet (81 mg total) by mouth daily. Swallow whole.  . lisinopril-hydrochlorothiazide (ZESTORETIC) 20-25 MG tablet [name]  . megestrol (MEGACE) 40 MG tablet Take 1 tablet (40 mg total) by mouth daily. Can increase to two tablets twice a day in the event of heavy bleeding  . QUEtiapine (SEROQUEL) 100 MG tablet Take 100 mg by mouth at bedtime.  . sertraline (ZOLOFT) 100 MG tablet [name]  . traZODone (DESYREL) 100 MG tablet Take 100-200 mg by mouth at bedtime.   Marland Kitchen atorvastatin (LIPITOR) 40 MG tablet Take 1 tablet (40 mg total) by mouth daily at 6 PM. (Patient not taking: Reported on 07/31/2020)  . ibuprofen (ADVIL) 800 MG tablet [name]  . senna-docusate (SENOKOT-S) 8.6-50 MG tablet Take 2 tablets by mouth at bedtime. For AFTER surgery, do not take if having diarrhea  . traMADol (ULTRAM) 50 MG tablet Take 1 tablet (50 mg total) by mouth every 6 (six) hours as needed for severe pain. For AFTER surgery only, do not take and drive  . [DISCONTINUED] Elastic Bandages & Supports (FUTURO SOFT CERVICAL COLLAR) MISC 1 Device by Does not apply route daily as needed.  . [DISCONTINUED] methocarbamol (ROBAXIN) 500 MG tablet Take 1 tablet (500 mg total) by mouth 3 (three)  times daily as needed for muscle spasms. (Patient not taking: Reported on 07/17/2020)  . [DISCONTINUED] predniSONE (DELTASONE) 50 MG tablet Take 1 tablet (50 mg total) by mouth daily.   No facility-administered encounter medications on file as of 08/01/2020.    Allergy: No Known Allergies  Social Hx:   Social History   Socioeconomic History  . Marital status: Single    Spouse name: Not on file  . Number of children: Not on file  . Years of education: Not on file  . Highest education level: Not on file  Occupational History  . Occupation: disabled  Tobacco Use  . Smoking status: Never Smoker  . Smokeless tobacco: Never Used  Vaping Use  . Vaping Use: Never used  Substance and Sexual Activity  . Alcohol use: Yes    Comment: very seldom  . Drug use: Never  . Sexual activity: Not Currently    Partners: Male    Birth control/protection: None, Surgical  Other Topics Concern  . Not on file  Social History Narrative   Patient lives at home with her son, who has autism. He vocalizes and acts out.   Social Determinants of Health   Financial Resource Strain:   . Difficulty of Paying Living Expenses: Not on file  Food Insecurity:   . Worried About  Running Out of Food in the Last Year: Not on file  . Ran Out of Food in the Last Year: Not on file  Transportation Needs:   . Lack of Transportation (Medical): Not on file  . Lack of Transportation (Non-Medical): Not on file  Physical Activity:   . Days of Exercise per Week: Not on file  . Minutes of Exercise per Session: Not on file  Stress:   . Feeling of Stress : Not on file  Social Connections:   . Frequency of Communication with Friends and Family: Not on file  . Frequency of Social Gatherings with Friends and Family: Not on file  . Attends Religious Services: Not on file  . Active Member of Clubs or Organizations: Not on file  . Attends Archivist Meetings: Not on file  . Marital Status: Not on file  Intimate  Partner Violence:   . Fear of Current or Ex-Partner: Not on file  . Emotionally Abused: Not on file  . Physically Abused: Not on file  . Sexually Abused: Not on file    Past Surgical Hx:  Past Surgical History:  Procedure Laterality Date  . BREAST LUMPECTOMY Left   . CESAREAN SECTION     x3  . TUBAL LIGATION Bilateral 1990    Past Medical Hx:  Past Medical History:  Diagnosis Date  . Anxiety   . Asthma   . Depression   . Essential hypertension   . Essential hypertension 05/26/2017  . Insomnia   . Manic depression ([place])   . Obesity   . Restless leg syndrome   . Stroke ([place])   . Uterine cancer [facility])     Past Gynecological History:  Pap in 2021 normal and negative high risk HPV No LMP recorded.  Family Hx:  Family History  Problem Relation Age of Onset  . CAD Mother 33  . Seizures Mother   . Other Father 42       struck by lightning  . Seizures Brother   . Cancer Maternal Aunt        lung  . Cancer Maternal Grandmother        breast  . Cancer Paternal Grandmother        cervical  . Multiple sclerosis Daughter   . Autism Son   . Stroke Neg Hx     Review of Systems:  Constitutional  Feels well,    ENT Normal appearing ears and nares bilaterally Skin/Breast  No rash, sores, jaundice, itching, dryness Cardiovascular  No chest pain, shortness of breath, or edema  Pulmonary  No cough or wheeze.  Gastro Intestinal  No nausea, vomitting, or diarrhoea. No bright red blood per rectum, no abdominal pain, change in bowel movement, or constipation.  Genito Urinary  No frequency, urgency, dysuria, + postmenopausal bleeding Musculo Skeletal  No myalgia, arthralgia, joint swelling or pain  Neurologic  No weakness, numbness, change in gait,  Psychology  No depression, anxiety, insomnia.   Vitals:  Blood pressure 132/65, pulse 86, temperature 98.1 F (36.7 C), temperature source Tympanic, resp. rate 18, height 5\' 2"  (1.575 m), weight 253 lb (114.8 kg), SpO2 99  %.  Physical Exam: WD in NAD Neck  Supple NROM, without any enlargements.  Lymph Node Survey No cervical supraclavicular or inguinal adenopathy Cardiovascular  Pulse normal rate, regularity and rhythm. S1 and S2 normal.  Lungs  Clear to auscultation bilateraly, without wheezes/crackles/rhonchi. Good air movement.  Skin  No rash/lesions/breakdown  Psychiatry  Alert and oriented  to person, place, and time  Abdomen  Normoactive bowel sounds, abdomen soft, non-tender and obese, + pannus, without evidence of hernia.  Back No CVA tenderness Genito Urinary  Vulva/vagina: Normal external female genitalia.   No lesions. No discharge or bleeding.  Bladder/urethra:  No lesions or masses, well supported bladder  Vagina: normal  Cervix: Normal appearing, no lesions.  Uterus: Bulky, mobile, no parametrial involvement or nodularity.  Adnexa: no palpable masses. Rectal  deferred Extremities  No bilateral cyanosis, clubbing or edema.  60 minutes of total time was spent for this patient encounter, including preparation, face-to-face counseling with the patient and coordination of care, and documentation of the encounter.   Thereasa Solo, MD  08/01/2020, 12:23 PM

## 2020-08-02 ENCOUNTER — Other Ambulatory Visit: Payer: Self-pay | Admitting: Gynecologic Oncology

## 2020-08-02 ENCOUNTER — Telehealth: Payer: Self-pay

## 2020-08-02 DIAGNOSIS — C541 Malignant neoplasm of endometrium: Secondary | ICD-10-CM

## 2020-08-02 NOTE — Telephone Encounter (Signed)
Told Sarah Bean that her Hgb A1c is 5.8.  This is in the pre diabetic range. Will proceed with surgery per Melissa Cross,NP. Sent a copy of Dr. Serita Grit office note and lab from 08-01-20 to PCP  Medicine, [facility] on [address] in [place]. Pt will call them to discuss managing her blood sugars with diet.

## 2020-08-08 ENCOUNTER — Encounter: Payer: Self-pay | Admitting: Neurology

## 2020-08-08 ENCOUNTER — Ambulatory Visit: Payer: Medicaid Other | Admitting: Neurology

## 2020-08-08 ENCOUNTER — Ambulatory Visit: Payer: Self-pay | Admitting: Neurology

## 2020-08-08 VITALS — BP 136/86 | HR 94 | Ht 62.0 in | Wt 252.0 lb

## 2020-08-08 DIAGNOSIS — G4733 Obstructive sleep apnea (adult) (pediatric): Secondary | ICD-10-CM | POA: Diagnosis not present

## 2020-08-08 NOTE — Patient Instructions (Addendum)
Please get in touch with your DME company, [employer], as soon as possible regarding the problem with your CPAP machine.  There may be a problem with the power cord or the power button or altogether some problem with the machine.  I do not see more than 7 days of usage since you received the machine in early August.  Please also talk to them about insurance coverage ongoing for your CPAP machine as your insurance requires that you are using your machine at least 70% of the time for more than 4 hours and currently you are not fulfilling those criteria.  Please call us in 1 month so we can look at another compliance download from the machine, we can do this remotely.  Please follow-up in 3 months to see Sarah Bean, nurse practitioner.  As discussed, you have a history of severe obstructive sleep apnea and I do not recommend that you remain untreated for this.  Please continue to work on weight loss and try to well-hydrated with water.    I wish you good luck with your upcoming surgery in November.

## 2020-08-08 NOTE — Progress Notes (Signed)
Subjective:    Patient ID: Sarah Bean is a 54 y.o. female.  HPI     Interim history:   Sarah Bean is a 54 year old right-handed woman with an underlying medical history of stroke, TIA, mood disorder, restless leg syndrome, hypertension, asthma, and morbid obesity with a BMI of over 40, who presents for follow-up consultation of her obstructive sleep apnea after interim testing.  The patient is unaccompanied today.  I first met her on 12/07/2019 at the request of Frann Rider, NP, at which time the patient reported snoring and excessive daytime somnolence.  She was advised to proceed with a sleep study.  She had a baseline sleep study, followed by a titration study.  Her baseline sleep study from 12/30/2019 showed severe obstructive sleep apnea with a total AHI of 31/h, O2 nadir of 76%.  She returned for a titration study on 02/02/2020.  She was fitted with a medium full facemask and CPAP was titrated from 5cm to 16 cm.  On a pressure of 15 cm her AHI was 0/h with supine REM sleep achieved an O2 nadir of 91%.  She was advised to start CPAP therapy at home at a pressure of 15 cm via full facemask. Her set up date was 05/26/20.  Today, 08/08/2020: I reviewed her CPAP compliance data from 05/30/2020 through 06/28/2020, which is a total of 30 days, during which time she used her machine only 7 days with an average usage for days on treatment of only 41 minutes, indicating very low compliance and no usage is recorded after 06/26/2020.  She reports that she has is shortage in the machine and it randomly turns off and on and the lights are blinking.  She reports that she called her DME company and was supposed to take the machine in but she has not been able to do so yet.  She is advised that her start date was 05/26/2020 and her 90 days will be coming up soon and that she by insurance criteria has not been able to be compliant yet.  She is advised to get in touch with her DME company as soon as possible as there may be  a problem with the actual machine or the power button or the power cord.  She is willing to do so and will call them today.  She reports that she has used her machine more than the 7 days and I am unable to see on the compliance data download.  She did not bring the machine.  She is scheduled for surgery for endometrial cancer in early November.  The patient's allergies, current medications, family history, past medical history, past social history, past surgical history and problem list were reviewed and updated as appropriate.   Previously:   12/07/19: (She) reports snoring and excessive daytime somnolence.  I reviewed your office note from 11/29/2019.  Her Epworth sleepiness score is 17 out of 24, fatigue severity score is 57 out of 63.  She tries to get 8 hours of sleep.  She is often in bed by 8 and rise time is around 6 but she is up earlier than that on some days.  She has nocturia about once per average night and has had morning headaches.  She has woken up with a sense of gasping and short of breath.  She does not have a family history of sleep apnea but her daughter is having symptoms as I understand.  The patient lives alone, she has 1 son and 2  daughters, her boyfriend has complained about her snoring.  She is a non-smoker and does not drink alcohol, she drinks caffeine in the form of coffee, 2 cups/day on average.  Her Past Medical History Is Significant For: Past Medical History:  Diagnosis Date  . Anxiety   . Asthma   . Depression   . Essential hypertension   . Essential hypertension 05/26/2017  . Insomnia   . Manic depression ([place])   . Obesity   . Restless leg syndrome   . Stroke ([place])   . Uterine cancer [facility])     Her Past Surgical History Is Significant For: Past Surgical History:  Procedure Laterality Date  . BREAST LUMPECTOMY Left   . CESAREAN SECTION     x3  . TUBAL LIGATION Bilateral 1990    Her Family History Is Significant For: Family History  Problem Relation Age  of Onset  . CAD Mother 93  . Seizures Mother   . Other Father 43       struck by lightning  . Seizures Brother   . Cancer Maternal Aunt        lung  . Cancer Maternal Grandmother        breast  . Cancer Paternal Grandmother        cervical  . Multiple sclerosis Daughter   . Autism Son   . Stroke Neg Hx     Her Social History Is Significant For: Social History   Socioeconomic History  . Marital status: Single    Spouse name: Not on file  . Number of children: Not on file  . Years of education: Not on file  . Highest education level: Not on file  Occupational History  . Occupation: disabled  Tobacco Use  . Smoking status: Never Smoker  . Smokeless tobacco: Never Used  Vaping Use  . Vaping Use: Never used  Substance and Sexual Activity  . Alcohol use: Yes    Comment: very seldom  . Drug use: Never  . Sexual activity: Not Currently    Partners: Male    Birth control/protection: None, Surgical  Other Topics Concern  . Not on file  Social History Narrative   Patient lives at home with her son, who has autism. He vocalizes and acts out.   Social Determinants of Health   Financial Resource Strain:   . Difficulty of Paying Living Expenses: Not on file  Food Insecurity:   . Worried About Charity fundraiser in the Last Year: Not on file  . Ran Out of Food in the Last Year: Not on file  Transportation Needs:   . Lack of Transportation (Medical): Not on file  . Lack of Transportation (Non-Medical): Not on file  Physical Activity:   . Days of Exercise per Week: Not on file  . Minutes of Exercise per Session: Not on file  Stress:   . Feeling of Stress : Not on file  Social Connections:   . Frequency of Communication with Friends and Family: Not on file  . Frequency of Social Gatherings with Friends and Family: Not on file  . Attends Religious Services: Not on file  . Active Member of Clubs or Organizations: Not on file  . Attends Archivist Meetings: Not  on file  . Marital Status: Not on file    Her Allergies Are:  No Known Allergies:   Her Current Medications Are:  Outpatient Encounter Medications as of 08/08/2020  Medication Sig  . albuterol (VENTOLIN HFA) 108 (  90 Base) MCG/ACT inhaler [name]  Marland Kitchen atorvastatin (LIPITOR) 40 MG tablet Take 1 tablet (40 mg total) by mouth daily at 6 PM.  . ibuprofen (ADVIL) 800 MG tablet [name]  . lisinopril-hydrochlorothiazide (ZESTORETIC) 20-25 MG tablet [name]  . megestrol (MEGACE) 40 MG tablet Take 1 tablet (40 mg total) by mouth daily. Can increase to two tablets twice a day in the event of heavy bleeding  . QUEtiapine (SEROQUEL) 100 MG tablet Take 100 mg by mouth at bedtime.  . senna-docusate (SENOKOT-S) 8.6-50 MG tablet Take 2 tablets by mouth at bedtime. For AFTER surgery, do not take if having diarrhea  . sertraline (ZOLOFT) 100 MG tablet [name]  . traMADol (ULTRAM) 50 MG tablet Take 1 tablet (50 mg total) by mouth every 6 (six) hours as needed for severe pain. For AFTER surgery only, do not take and drive  . traZODone (DESYREL) 100 MG tablet Take 100-200 mg by mouth at bedtime.   Marland Kitchen aspirin EC 81 MG tablet Take 1 tablet (81 mg total) by mouth daily. Swallow whole. (Patient not taking: Reported on 08/08/2020)   No facility-administered encounter medications on file as of 08/08/2020.  :  Review of Systems:  Out of a complete 14 point review of systems, all are reviewed and negative with the exception of these symptoms as listed below:  Review of Systems  Neurological:       Pt her to f/u on her cpap. She reports she has notice a shortage in her cpap and it will cut off and on throughout the the night.  Pt reports she is trying to get a ride to take her cpap to  the DME to have it looked at. Pt reports this could be related to a shortage in her home outlet as well but she is not sure. She sts the shortage has hindered her from using her machine some.     Objective:  Neurological Exam  Physical Exam Physical Examination:   Vitals:   08/08/20 1017  BP: 136/86  Pulse: 94  SpO2: 95%    General Examination: The patient is a very pleasant 54 y.o. female in no acute distress. She appears well-developed and well-nourished and well groomed.   HEENT: Normocephalic, atraumatic, pupils are equal, round and reactive to light. Extraocular tracking is good without limitation to gaze excursion or nystagmus noted. Normal smooth pursuit is noted. Hearing is grossly intact. Face is symmetric with normal facial animation, speech is clear with no dysarthria noted. There is no hypophonia. There is no lip, neck/head, jaw or voice tremor. Neck is supple with full range of passive and active motion. There are no carotid bruits on auscultation. Oropharynx exam reveals: moderate mouth dryness, adequate dental hygiene and moderate airway crowding. Tongue protrudes centrally in palate elevates symmetrically.   Chest: Clear to auscultation without wheezing, rhonchi or crackles noted.  Heart: S1+S2+0, regular and normal without murmurs, rubs or gallops noted.   Abdomen: Soft, non-tender and non-distended with normal bowel sounds appreciated on auscultation.  Extremities: There is no pitting edema in the distal lower extremities bilaterally. Pedal pulses are intact.  Skin: Warm and dry without trophic changes noted.  Musculoskeletal: exam reveals no obvious joint deformities, tenderness or joint swelling  or erythema.   Neurologically:  Mental status: The patient is awake, alert and oriented in all 4 spheres. Her immediate and remote memory, attention, language skills and fund of knowledge are appropriate. There is no evidence of aphasia, agnosia, apraxia or anomia.  Speech is clear with normal prosody and enunciation. Thought process is linear. Mood is normal and affect is normal.  Cranial nerves II - XII are as described above under HEENT exam.  Motor exam: Normal bulk, strength and tone is noted. There is no tremor. Fine motor skills and coordination: grossly intact. Cerebellar testing: No dysmetria or intention tremor. Sensory exam: intact to light touch. Gait, station and balance: She stands easily. No problems walking.  Assessment and plan:  In summary, Sarah Bean is a very pleasant 54 year old female with an underlying medical history of stroke, TIA, mood disorder, restless leg syndrome, hypertension, asthma, and morbid obesity with a BMI of over 40, who presents for follow-up consultation for obstructive sleep apnea after interim testing and starting CPAP therapy.  Unfortunately, she has not been able to use her CPAP machine due to problems with the machine or the power button or the power cord, reports problems with the machine blinking and turning off.  She has not been able to have it looked at by her DME company yet.  She is advised to call her DME company as soon as possible to set up an appointment so she can have the machine looked at.  She may need a replacement machine.  She reports that she has used it more than 7 days, I do not have any other complaints data available.  She did not bring the machine today.  She is advised to call in about a month for [place] to look at another remote download.  I have asked her to follow-up with the nurse practitioner in about 3 months.  We talked about the importance of compliance, not just to fulfill insurance criteria but also to benefit from treatment long-term.  She has severe obstructive sleep apnea as determined by her baseline sleep study on 12/30/2019.  AHI was 31/h, O2 nadir 76%.  She had a subsequent CPAP titration study on 02/02/2020 and did quite well with CPAP of 15 cm, achieved supine REM sleep and O2  nadir was 91% on the optimal treatment setting.  She is agreeable to continuing with CPAP therapy and agreeable to making an appointment with her DME provider for troubleshooting.  I answered all her questions today and she will follow-up in 3 months to see the nurse practitioner and call us in 1 month so we can review another download remotely.  I spent 20 minutes in total face-to-face time and in reviewing records during pre-charting, more than 50% of which was spent in counseling and coordination of care, reviewing test results, reviewing medications and treatment regimen and/or in discussing or reviewing the diagnosis of OSA, the prognosis and treatment options. Pertinent laboratory and imaging test results that were available during this visit with the patient were reviewed by me and considered in my medical decision making (see chart for details).

## 2020-08-18 NOTE — Progress Notes (Addendum)
COVID Vaccine Completed: YES Date COVID Vaccine completed: 04/06/20, 05/04/20 COVID vaccine manufacturer:     Moderna      PCP - [facility] - N/A  Chest x-ray - 07/28/20 in epic EKG - 07/28/20 in epic Stress Test - N/A ECHO - 08/20/19 in epic Cardiac Cath - N/A Pacemaker/ICD device last checked:N/A  Sleep Study - 12/30/19 CPAP - Yes  Fasting Blood Sugar - N/A Checks Blood Sugar __N/A___ times a day Hgb A1c 5.8 08/01/20 in epic  Blood Thinner Instructions: N/A Aspirin Instructions: Yes Last Dose: 08/01/20  Anesthesia review: History of stroke  Patient denies shortness of breath, fever, cough and chest pain at PAT appointment   Patient verbalized understanding of instructions that were given to them at the PAT appointment. Patient was also instructed that they will need to review over the PAT instructions again at home before surgery.

## 2020-08-18 NOTE — Patient Instructions (Addendum)
DUE TO COVID-19 ONLY ONE VISITOR IS ALLOWED TO COME WITH YOU AND STAY IN THE WAITING ROOM ONLY DURING PRE OP AND PROCEDURE.    COVID SWAB TESTING MUST BE COMPLETED ON: Friday, Nov. 5, 2021 at  10:10 AM   [address] [place], [place] [address]  (Must self quarantine after testing. Follow instructions on handout.)       Your procedure is scheduled on: Tuesday, Nov. 9, 2021   Report to [facility] Main  Entrance   Report to Short Stay at 5:30 AM   [facility])   Call this number if you have problems the morning of surgery [phone]   Do not eat food :After Midnight.   May have liquids until 4:30AM   day of surgery  CLEAR LIQUID DIET  Foods Allowed                                                                     Foods Excluded  Water, Black Coffee and tea, regular and decaf                             liquids that you cannot  Plain Jell-O in any flavor  (No red)                                           see through such as: Fruit ices (not with fruit pulp)                                     milk, soups, orange juice              Iced Popsicles (No red)                                    All solid food                                   Apple juices Sports drinks like Gatorade (No red) Lightly seasoned clear broth or consume(fat free) Sugar, honey syrup  Sample Menu Breakfast                                Lunch                                     Supper Cranberry juice                    Beef broth                            Chicken broth Jell-O  Grape juice                           Apple juice Coffee or tea                        Jell-O                                      Popsicle                                                Coffee or tea                        Coffee or tea       Oral Hygiene is also important to reduce your risk of infection.                                    Remember - BRUSH YOUR TEETH THE MORNING OF  SURGERY WITH YOUR REGULAR TOOTHPASTE   Do NOT smoke after Midnight   Take these medicines the morning of surgery with A SIP OF WATER: Sertraline   Bring rescue inhaler day of surgery   Bring CPAP mask and tubing day of surgery                               You may not have any metal on your body including hair pins, jewelry, and body piercings             Do not wear make-up, lotions, powders, perfumes/cologne, or deodorant             Do not wear nail polish.  Do not shave  48 hours prior to surgery.                Do not bring valuables to the hospital. [place].   Contacts, dentures or bridgework may not be worn into surgery.    Patients discharged the day of surgery will not be allowed to drive home.   Special Instructions: Bring a copy of your healthcare power of attorney and living will documents         the day of surgery if you haven't scanned them in before.              Please read over the following fact sheets you were given: IF YOU HAVE QUESTIONS ABOUT YOUR PRE OP INSTRUCTIONS PLEASE CALL [phone]   [place] - Preparing for Surgery Before surgery, you can play an important role.  Because skin is not sterile, your skin needs to be as free of germs as possible.  You can reduce the number of germs on your skin by washing with CHG (chlorahexidine gluconate) soap before surgery.  CHG is an antiseptic cleaner which kills germs and bonds with the skin to continue killing germs even after washing. Please DO NOT use if you have an allergy to CHG or antibacterial  soaps.  If your skin becomes reddened/irritated stop using the CHG and inform your nurse when you arrive at Short Stay. Do not shave (including legs and underarms) for at least 48 hours prior to the first CHG shower.  You may shave your face/neck.  Please follow these instructions carefully:  1.  Shower with CHG Soap the night before surgery and the  morning of  surgery.  2.  If you choose to wash your hair, wash your hair first as usual with your normal  shampoo.  3.  After you shampoo, rinse your hair and body thoroughly to remove the shampoo.                             4.  Use CHG as you would any other liquid soap.  You can apply chg directly to the skin and wash.  Gently with a scrungie or clean washcloth.  5.  Apply the CHG Soap to your body ONLY FROM THE NECK DOWN.   Do   not use on face/ open                           Wound or open sores. Avoid contact with eyes, ears mouth and   genitals (private parts).                       Wash face,  Genitals (private parts) with your normal soap.             6.  Wash thoroughly, paying special attention to the area where your    surgery  will be performed.  7.  Thoroughly rinse your body with warm water from the neck down.  8.  DO NOT shower/wash with your normal soap after using and rinsing off the CHG Soap.                9.  Pat yourself dry with a clean towel.            10.  Wear clean pajamas.            11.  Place clean sheets on your bed the night of your first shower and do not  sleep with pets. Day of Surgery : Do not apply any lotions/deodorants the morning of surgery.  Please wear clean clothes to the hospital/surgery center.  FAILURE TO FOLLOW THESE INSTRUCTIONS MAY RESULT IN THE CANCELLATION OF YOUR SURGERY  PATIENT SIGNATURE_________________________________  NURSE SIGNATURE__________________________________  ________________________________________________________________________  WHAT IS A BLOOD TRANSFUSION? Blood Transfusion Information  A transfusion is the replacement of blood or some of its parts. Blood is made up of multiple cells which provide different functions.  Red blood cells carry oxygen and are used for blood loss replacement.  White blood cells fight against infection.  Platelets control bleeding.  Plasma helps clot blood.  Other blood products are available  for specialized needs, such as hemophilia or other clotting disorders. BEFORE THE TRANSFUSION  Who gives blood for transfusions?   Healthy volunteers who are fully evaluated to make sure their blood is safe. This is blood bank blood. Transfusion therapy is the safest it has ever been in the practice of medicine. Before blood is taken from a donor, a complete history is taken to make sure that person has no history of diseases nor engages in risky social behavior (examples are intravenous drug use or sexual  activity with multiple partners). The donor's travel history is screened to minimize risk of transmitting infections, such as malaria. The donated blood is tested for signs of infectious diseases, such as HIV and hepatitis. The blood is then tested to be sure it is compatible with you in order to minimize the chance of a transfusion reaction. If you or a relative donates blood, this is often done in anticipation of surgery and is not appropriate for emergency situations. It takes many days to process the donated blood. RISKS AND COMPLICATIONS Although transfusion therapy is very safe and saves many lives, the main dangers of transfusion include:   Getting an infectious disease.  Developing a transfusion reaction. This is an allergic reaction to something in the blood you were given. Every precaution is taken to prevent this. The decision to have a blood transfusion has been considered carefully by your caregiver before blood is given. Blood is not given unless the benefits outweigh the risks. AFTER THE TRANSFUSION  Right after receiving a blood transfusion, you will usually feel much better and more energetic. This is especially true if your red blood cells have gotten low (anemic). The transfusion raises the level of the red blood cells which carry oxygen, and this usually causes an energy increase.  The nurse administering the transfusion will monitor you carefully for complications. HOME CARE  INSTRUCTIONS  No special instructions are needed after a transfusion. You may find your energy is better. Speak with your caregiver about any limitations on activity for underlying diseases you may have. SEEK MEDICAL CARE IF:   Your condition is not improving after your transfusion.  You develop redness or irritation at the intravenous (IV) site. SEEK IMMEDIATE MEDICAL CARE IF:  Any of the following symptoms occur over the next 12 hours:  Shaking chills.  You have a temperature by mouth above 102 F (38.9 C), not controlled by medicine.  Chest, back, or muscle pain.  People around you feel you are not acting correctly or are confused.  Shortness of breath or difficulty breathing.  Dizziness and fainting.  You get a rash or develop hives.  You have a decrease in urine output.  Your urine turns a dark color or changes to pink, red, or [place]. Any of the following symptoms occur over the next 10 days:  You have a temperature by mouth above 102 F (38.9 C), not controlled by medicine.  Shortness of breath.  Weakness after normal activity.  The white part of the eye turns yellow (jaundice).  You have a decrease in the amount of urine or are urinating less often.  Your urine turns a dark color or changes to pink, red, or [place]. Document Released: 10/04/2000 Document Revised: 12/30/2011 Document Reviewed: 05/23/2008 [facility] Patient Information 2014 [place], [place].  _______________________________________________________________________

## 2020-08-21 ENCOUNTER — Encounter (HOSPITAL_COMMUNITY): Payer: Self-pay

## 2020-08-21 ENCOUNTER — Other Ambulatory Visit: Payer: Self-pay

## 2020-08-21 ENCOUNTER — Encounter (HOSPITAL_COMMUNITY)
Admission: RE | Admit: 2020-08-21 | Discharge: 2020-08-21 | Disposition: A | Discharge status: Home / Self Care | Payer: Medicaid Other | Source: Ambulatory Visit | Attending: Gynecologic Oncology | Admitting: Gynecologic Oncology

## 2020-08-21 DIAGNOSIS — Z01818 Encounter for other preprocedural examination: Secondary | ICD-10-CM | POA: Diagnosis present

## 2020-08-21 HISTORY — DX: Pneumonia, unspecified organism: J18.9

## 2020-08-21 HISTORY — PX: ABDOMINAL HYSTERECTOMY: SHX81

## 2020-08-21 HISTORY — DX: Gastro-esophageal reflux disease without esophagitis: K21.9

## 2020-08-21 HISTORY — DX: Anemia, unspecified: D64.9

## 2020-08-21 LAB — COMPREHENSIVE METABOLIC PANEL
ALT: 9 U/L (ref 0–44)
AST: 12 U/L — ABNORMAL LOW (ref 15–41)
Albumin: 4.2 g/dL (ref 3.5–5.0)
Alkaline Phosphatase: 56 U/L (ref 38–126)
Anion gap: 12 (ref 5–15)
BUN: 21 mg/dL — ABNORMAL HIGH (ref 6–20)
CO2: 21 mmol/L — ABNORMAL LOW (ref 22–32)
Calcium: 10.6 mg/dL — ABNORMAL HIGH (ref 8.9–10.3)
Chloride: 106 mmol/L (ref 98–111)
Creatinine, Ser: 1.06 mg/dL — ABNORMAL HIGH (ref 0.44–1.00)
GFR, Estimated: >60 mL/min (ref >60)
Glucose, Bld: 96 mg/dL (ref 70–99)
Potassium: 4.1 mmol/L (ref 3.5–5.1)
Sodium: 139 mmol/L (ref 135–145)
Total Bilirubin: 0.6 mg/dL (ref 0.3–1.2)
Total Protein: 8.1 g/dL (ref 6.5–8.1)

## 2020-08-21 LAB — URINALYSIS, ROUTINE W REFLEX MICROSCOPIC
Bacteria, UA: NONE SEEN
Bilirubin Urine: NEGATIVE
Glucose, UA: NEGATIVE mg/dL
Ketones, ur: NEGATIVE mg/dL
Leukocytes,Ua: NEGATIVE
Nitrite: NEGATIVE
Protein, ur: NEGATIVE mg/dL
RBC / HPF: >50 RBC/hpf — ABNORMAL HIGH (ref 0–5)
Specific Gravity, Urine: 1.02 (ref 1.005–1.030)
pH: 5 (ref 5.0–8.0)

## 2020-08-21 LAB — CBC
HCT: 35.4 % — ABNORMAL LOW (ref 36.0–46.0)
Hemoglobin: 10.6 g/dL — ABNORMAL LOW (ref 12.0–15.0)
MCH: 20.8 pg — ABNORMAL LOW (ref 26.0–34.0)
MCHC: 29.9 g/dL — ABNORMAL LOW (ref 30.0–36.0)
MCV: 69.5 fL — ABNORMAL LOW (ref 80.0–100.0)
Platelets: 382 10*3/uL (ref 150–400)
RBC: 5.09 MIL/uL (ref 3.87–5.11)
RDW: 23.9 % — ABNORMAL HIGH (ref 11.5–15.5)
WBC: 10.6 10*3/uL — ABNORMAL HIGH (ref 4.0–10.5)
nRBC: 0 % (ref 0.0–0.2)

## 2020-08-24 ENCOUNTER — Telehealth: Payer: Self-pay | Admitting: *Deleted

## 2020-08-24 NOTE — Telephone Encounter (Signed)
Patient called and left a message stating "I wanted to know if my lab work I just did is good enough to still have my surgery on 11/9. I don't totally understand my blood results on my chart. If someone and please call."  Message sent to [facility] APP

## 2020-08-25 ENCOUNTER — Other Ambulatory Visit (HOSPITAL_COMMUNITY)
Admission: RE | Admit: 2020-08-25 | Discharge: 2020-08-25 | Disposition: A | Discharge status: Home / Self Care | Payer: Medicaid Other | Source: Ambulatory Visit | Attending: Gynecologic Oncology | Admitting: Gynecologic Oncology

## 2020-08-25 ENCOUNTER — Telehealth: Payer: Self-pay | Admitting: Oncology

## 2020-08-25 DIAGNOSIS — Z20822 Contact with and (suspected) exposure to covid-19: Secondary | ICD-10-CM | POA: Insufficient documentation

## 2020-08-25 DIAGNOSIS — Z01812 Encounter for preprocedural laboratory examination: Secondary | ICD-10-CM | POA: Insufficient documentation

## 2020-08-25 LAB — SARS CORONAVIRUS 2 (TAT 6-24 HRS): SARS Coronavirus 2: NEGATIVE

## 2020-08-25 NOTE — Telephone Encounter (Signed)
Called [name] to review her lab work.  She said she just needed to know if everything is good for surgery.  Advised her that her labs have been reviewed and that she can proceed with surgery.  She verbalized understanding and agreement.

## 2020-08-28 ENCOUNTER — Telehealth: Payer: Self-pay

## 2020-08-28 NOTE — Anesthesia Preprocedure Evaluation (Addendum)
Anesthesia Evaluation  Patient identified by MRN, date of birth, ID band Patient awake    Reviewed: Allergy & Precautions, NPO status , Patient's Chart, lab work & pertinent test results  History of Anesthesia Complications Negative for: history of anesthetic complications  Airway Mallampati: III  TM Distance: >3 FB Neck ROM: Full   Comment:  Short, wide neck  Dental  (+) Dental Advisory Given, Teeth Intact   Pulmonary asthma ,   Suspected OSA    Pulmonary exam normal  + decreased breath sounds      Cardiovascular hypertension, Pt. on medications Normal cardiovascular exam   '20 TTE - EF 65 to 70%. Trace MR. Mild TR.     Neuro/Psych PSYCHIATRIC DISORDERS Anxiety Depression Bipolar Disorder TIACVA (2018), No Residual Symptoms    GI/Hepatic Neg liver ROS, GERD  Controlled,  Endo/Other  Morbid obesity  Renal/GU negative Renal ROS     Musculoskeletal negative musculoskeletal ROS (+)   Abdominal   Peds  Hematology  (+) anemia ,   Anesthesia Other Findings Covid test negative   Reproductive/Obstetrics                            Anesthesia Physical Anesthesia Plan  ASA: III  Anesthesia Plan: General   Post-op Pain Management:    Induction: Intravenous  PONV Risk Score and Plan: 4 or greater and Treatment may vary due to age or medical condition, Ondansetron, Dexamethasone, Midazolam and Scopolamine patch - Pre-op  Airway Management Planned: Oral ETT and Video Laryngoscope Planned  Additional Equipment: None  Intra-op Plan:   Post-operative Plan: Extubation in OR  Informed Consent: I have reviewed the patients History and Physical, chart, labs and discussed the procedure including the risks, benefits and alternatives for the proposed anesthesia with the patient or authorized representative who has indicated his/her understanding and acceptance.     Dental advisory  given  Plan Discussed with: CRNA and Anesthesiologist  Anesthesia Plan Comments:        Anesthesia Quick Evaluation

## 2020-08-28 NOTE — Telephone Encounter (Addendum)
Reviewed written pre-op instructions with Ms Kamen as well as the medications to have from the pharmacy for pain and stool softener and laxative. Told her she takes the senna-s tomorrow night not today. She does not need to take a laxative today. Pt verbalized understanding.  Ms Rud states that she has not taken her ASA since 08-01-20.

## 2020-08-29 ENCOUNTER — Ambulatory Visit (HOSPITAL_COMMUNITY): Payer: Medicaid Other | Admitting: Anesthesiology

## 2020-08-29 ENCOUNTER — Ambulatory Visit (HOSPITAL_COMMUNITY): Payer: Medicaid Other | Admitting: Physician Assistant

## 2020-08-29 ENCOUNTER — Ambulatory Visit (HOSPITAL_COMMUNITY)
Admission: RE | Admit: 2020-08-29 | Discharge: 2020-08-29 | Disposition: A | Discharge status: Home / Self Care | Payer: Medicaid Other | Attending: Gynecologic Oncology | Admitting: Gynecologic Oncology

## 2020-08-29 ENCOUNTER — Other Ambulatory Visit: Payer: Self-pay

## 2020-08-29 ENCOUNTER — Encounter (HOSPITAL_COMMUNITY): Payer: Self-pay | Admitting: Gynecologic Oncology

## 2020-08-29 ENCOUNTER — Encounter (HOSPITAL_COMMUNITY)
Admission: RE | Disposition: A | Discharge status: Home / Self Care | Payer: Self-pay | Source: Home / Self Care | Attending: Gynecologic Oncology

## 2020-08-29 DIAGNOSIS — Z6841 Body Mass Index (BMI) 40.0 and over, adult: Secondary | ICD-10-CM | POA: Diagnosis not present

## 2020-08-29 DIAGNOSIS — Z7982 Long term (current) use of aspirin: Secondary | ICD-10-CM | POA: Insufficient documentation

## 2020-08-29 DIAGNOSIS — D259 Leiomyoma of uterus, unspecified: Secondary | ICD-10-CM

## 2020-08-29 DIAGNOSIS — Z79899 Other long term (current) drug therapy: Secondary | ICD-10-CM | POA: Diagnosis not present

## 2020-08-29 DIAGNOSIS — C541 Malignant neoplasm of endometrium: Secondary | ICD-10-CM | POA: Diagnosis not present

## 2020-08-29 HISTORY — PX: ROBOTIC ASSISTED TOTAL HYSTERECTOMY WITH BILATERAL SALPINGO OOPHERECTOMY: SHX6086

## 2020-08-29 LAB — TYPE AND SCREEN
ABO/RH(D): B POS
Antibody Screen: NEGATIVE

## 2020-08-29 LAB — PREGNANCY, URINE: Preg Test, Ur: NEGATIVE

## 2020-08-29 SURGERY — HYSTERECTOMY, TOTAL, ROBOT-ASSISTED, LAPAROSCOPIC, WITH BILATERAL SALPINGO-OOPHORECTOMY
Anesthesia: General

## 2020-08-29 MED ORDER — SUGAMMADEX SODIUM 500 MG/5ML IV SOLN
INTRAVENOUS | Status: DC | PRN
  Administered 2020-08-29: 500 mg via INTRAVENOUS

## 2020-08-29 MED ORDER — LIDOCAINE HCL 2 % IJ SOLN
INTRAMUSCULAR | Status: AC
  Filled 2020-08-29: qty 20

## 2020-08-29 MED ORDER — PROMETHAZINE HCL 25 MG/ML IJ SOLN: 6.2500 mg | INTRAMUSCULAR | Status: DC | PRN

## 2020-08-29 MED ORDER — BUPIVACAINE HCL 0.25 % IJ SOLN
INTRAMUSCULAR | Status: AC
  Filled 2020-08-29: qty 1

## 2020-08-29 MED ORDER — BUPIVACAINE HCL 0.25 % IJ SOLN
INTRAMUSCULAR | Status: DC | PRN
  Administered 2020-08-29: 50 mL

## 2020-08-29 MED ORDER — SUCCINYLCHOLINE CHLORIDE 200 MG/10ML IV SOSY
PREFILLED_SYRINGE | INTRAVENOUS | Status: DC | PRN
  Administered 2020-08-29: 160 mg via INTRAVENOUS

## 2020-08-29 MED ORDER — FENTANYL CITRATE (PF) 100 MCG/2ML IJ SOLN: 25.0000 ug | INTRAMUSCULAR | Status: DC | PRN

## 2020-08-29 MED ORDER — CHLORHEXIDINE GLUCONATE 0.12 % MT SOLN
15.0000 mL | Freq: Once | OROMUCOSAL | Status: AC
  Administered 2020-08-29: 15 mL via OROMUCOSAL

## 2020-08-29 MED ORDER — PHENYLEPHRINE HCL (PRESSORS) 10 MG/ML IV SOLN
INTRAVENOUS | Status: AC
  Filled 2020-08-29: qty 1

## 2020-08-29 MED ORDER — LIDOCAINE 2% (20 MG/ML) 5 ML SYRINGE
INTRAMUSCULAR | Status: DC | PRN
  Administered 2020-08-29: 60 mg via INTRAVENOUS

## 2020-08-29 MED ORDER — DEXAMETHASONE SODIUM PHOSPHATE 10 MG/ML IJ SOLN
INTRAMUSCULAR | Status: AC
  Filled 2020-08-29: qty 1

## 2020-08-29 MED ORDER — LIDOCAINE 2% (20 MG/ML) 5 ML SYRINGE
INTRAMUSCULAR | Status: AC
  Filled 2020-08-29: qty 5

## 2020-08-29 MED ORDER — OXYCODONE HCL 5 MG PO TABS
ORAL_TABLET | ORAL | Status: AC
  Filled 2020-08-29: qty 1

## 2020-08-29 MED ORDER — CEFAZOLIN SODIUM-DEXTROSE 2-4 GM/100ML-% IV SOLN
2.0000 g | INTRAVENOUS | Status: AC
  Administered 2020-08-29: 2 g via INTRAVENOUS
  Filled 2020-08-29: qty 100

## 2020-08-29 MED ORDER — LIDOCAINE HCL 2 % IJ SOLN
INTRAMUSCULAR | Status: AC
  Filled 2020-08-29: qty 40

## 2020-08-29 MED ORDER — ENOXAPARIN SODIUM 40 MG/0.4ML ~~LOC~~ SOLN
40.0000 mg | SUBCUTANEOUS | Status: AC
  Administered 2020-08-29: 40 mg via SUBCUTANEOUS
  Filled 2020-08-29: qty 0.4

## 2020-08-29 MED ORDER — DEXAMETHASONE SODIUM PHOSPHATE 4 MG/ML IJ SOLN: 4.0000 mg | INTRAMUSCULAR | Status: DC

## 2020-08-29 MED ORDER — ONDANSETRON HCL 4 MG/2ML IJ SOLN
INTRAMUSCULAR | Status: DC | PRN
  Administered 2020-08-29: 4 mg via INTRAVENOUS

## 2020-08-29 MED ORDER — LIDOCAINE HCL (PF) 2 % IJ SOLN
INTRAMUSCULAR | Status: DC | PRN
  Administered 2020-08-29: 1 mg/kg/h via INTRADERMAL

## 2020-08-29 MED ORDER — OXYCODONE HCL 5 MG/5ML PO SOLN: 5.0000 mg | Freq: Once | ORAL | Status: AC | PRN

## 2020-08-29 MED ORDER — ROCURONIUM BROMIDE 10 MG/ML (PF) SYRINGE
PREFILLED_SYRINGE | INTRAVENOUS | Status: AC
  Filled 2020-08-29: qty 10

## 2020-08-29 MED ORDER — FENTANYL CITRATE (PF) 100 MCG/2ML IJ SOLN
INTRAMUSCULAR | Status: DC | PRN
  Administered 2020-08-29: 100 ug via INTRAVENOUS

## 2020-08-29 MED ORDER — LACTATED RINGERS IR SOLN
Status: DC | PRN
  Administered 2020-08-29: 1000 mL

## 2020-08-29 MED ORDER — STERILE WATER FOR IRRIGATION IR SOLN
Status: DC | PRN
  Administered 2020-08-29: 1000 mL

## 2020-08-29 MED ORDER — GABAPENTIN 300 MG PO CAPS
300.0000 mg | ORAL_CAPSULE | ORAL | Status: AC
  Administered 2020-08-29: 300 mg via ORAL
  Filled 2020-08-29: qty 1

## 2020-08-29 MED ORDER — SODIUM CHLORIDE 0.9% FLUSH: 3.0000 mL | Freq: Two times a day (BID) | INTRAVENOUS | Status: DC

## 2020-08-29 MED ORDER — LACTATED RINGERS IV SOLN: INTRAVENOUS | Status: DC

## 2020-08-29 MED ORDER — FENTANYL CITRATE (PF) 100 MCG/2ML IJ SOLN
INTRAMUSCULAR | Status: AC
  Filled 2020-08-29: qty 2

## 2020-08-29 MED ORDER — STERILE WATER FOR INJECTION IJ SOLN
INTRAMUSCULAR | Status: DC | PRN
  Administered 2020-08-29: 10 mL

## 2020-08-29 MED ORDER — DEXAMETHASONE SODIUM PHOSPHATE 10 MG/ML IJ SOLN
INTRAMUSCULAR | Status: DC | PRN
  Administered 2020-08-29: 10 mg via INTRAVENOUS

## 2020-08-29 MED ORDER — ONDANSETRON HCL 4 MG/2ML IJ SOLN
INTRAMUSCULAR | Status: AC
  Filled 2020-08-29: qty 2

## 2020-08-29 MED ORDER — ACETAMINOPHEN 500 MG PO TABS
1000.0000 mg | ORAL_TABLET | ORAL | Status: AC
  Administered 2020-08-29: 1000 mg via ORAL
  Filled 2020-08-29: qty 2

## 2020-08-29 MED ORDER — MIDAZOLAM HCL 2 MG/2ML IJ SOLN
INTRAMUSCULAR | Status: AC
  Filled 2020-08-29: qty 2

## 2020-08-29 MED ORDER — ORAL CARE MOUTH RINSE: 15.0000 mL | Freq: Once | OROMUCOSAL | Status: AC

## 2020-08-29 MED ORDER — SUGAMMADEX SODIUM 500 MG/5ML IV SOLN
INTRAVENOUS | Status: AC
  Filled 2020-08-29: qty 5

## 2020-08-29 MED ORDER — SCOPOLAMINE 1 MG/3DAYS TD PT72
1.0000 | MEDICATED_PATCH | TRANSDERMAL | Status: DC
  Administered 2020-08-29: 1.5 mg via TRANSDERMAL
  Filled 2020-08-29: qty 1

## 2020-08-29 MED ORDER — CELECOXIB 200 MG PO CAPS
400.0000 mg | ORAL_CAPSULE | ORAL | Status: AC
  Administered 2020-08-29: 400 mg via ORAL
  Filled 2020-08-29: qty 2

## 2020-08-29 MED ORDER — STERILE WATER FOR INJECTION IJ SOLN
INTRAMUSCULAR | Status: AC
  Filled 2020-08-29: qty 10

## 2020-08-29 MED ORDER — FENTANYL CITRATE (PF) 100 MCG/2ML IJ SOLN
INTRAMUSCULAR | Status: AC
  Administered 2020-08-29: 50 ug via INTRAVENOUS
  Filled 2020-08-29: qty 2

## 2020-08-29 MED ORDER — PROPOFOL 10 MG/ML IV BOLUS
INTRAVENOUS | Status: AC
  Filled 2020-08-29: qty 20

## 2020-08-29 MED ORDER — ALBUTEROL SULFATE HFA 108 (90 BASE) MCG/ACT IN AERS
INHALATION_SPRAY | RESPIRATORY_TRACT | Status: DC | PRN
  Administered 2020-08-29: 4 via RESPIRATORY_TRACT

## 2020-08-29 MED ORDER — BUPIVACAINE LIPOSOME 1.3 % IJ SUSP
20.0000 mL | Freq: Once | INTRAMUSCULAR | Status: AC
  Administered 2020-08-29: 20 mL
  Filled 2020-08-29: qty 20

## 2020-08-29 MED ORDER — PHENYLEPHRINE HCL-NACL 10-0.9 MG/250ML-% IV SOLN
INTRAVENOUS | Status: DC | PRN
  Administered 2020-08-29: 25 ug/min via INTRAVENOUS

## 2020-08-29 MED ORDER — ROCURONIUM BROMIDE 10 MG/ML (PF) SYRINGE
PREFILLED_SYRINGE | INTRAVENOUS | Status: DC | PRN
  Administered 2020-08-29: 50 mg via INTRAVENOUS
  Administered 2020-08-29: 30 mg via INTRAVENOUS
  Administered 2020-08-29: 50 mg via INTRAVENOUS
  Administered 2020-08-29: 20 mg via INTRAVENOUS

## 2020-08-29 MED ORDER — OXYCODONE HCL 5 MG PO TABS
5.0000 mg | ORAL_TABLET | Freq: Once | ORAL | Status: AC | PRN
  Administered 2020-08-29: 5 mg via ORAL

## 2020-08-29 MED ORDER — ALBUTEROL SULFATE HFA 108 (90 BASE) MCG/ACT IN AERS
INHALATION_SPRAY | RESPIRATORY_TRACT | Status: AC
  Filled 2020-08-29: qty 6.7

## 2020-08-29 MED ORDER — KETAMINE HCL 10 MG/ML IJ SOLN
INTRAMUSCULAR | Status: AC
  Filled 2020-08-29: qty 1

## 2020-08-29 MED ORDER — PROPOFOL 10 MG/ML IV BOLUS
INTRAVENOUS | Status: DC | PRN
  Administered 2020-08-29: 200 mg via INTRAVENOUS

## 2020-08-29 MED ORDER — PHENYLEPHRINE 40 MCG/ML (10ML) SYRINGE FOR IV PUSH (FOR BLOOD PRESSURE SUPPORT)
PREFILLED_SYRINGE | INTRAVENOUS | Status: DC | PRN
  Administered 2020-08-29 (×5): 80 ug via INTRAVENOUS

## 2020-08-29 MED ORDER — MIDAZOLAM HCL 5 MG/5ML IJ SOLN
INTRAMUSCULAR | Status: DC | PRN
  Administered 2020-08-29: 2 mg via INTRAVENOUS

## 2020-08-29 SURGICAL SUPPLY — 76 items
ADH SKN CLS APL DERMABOND .7 (GAUZE/BANDAGES/DRESSINGS) ×1
AGENT HMST KT MTR STRL THRMB (HEMOSTASIS)
APL ESCP 34 STRL LF DISP (HEMOSTASIS)
APPLICATOR SURGIFLO ENDO (HEMOSTASIS) IMPLANT
BACTOSHIELD CHG 4% 4OZ (MISCELLANEOUS) ×1
BAG LAPAROSCOPIC 12 15 PORT 16 (BASKET) IMPLANT
BAG RETRIEVAL 12/15 (BASKET) ×2
BAG SPEC RTRVL LRG 6X4 10 (ENDOMECHANICALS)
BLADE SURG SZ10 CARB STEEL (BLADE) ×1 IMPLANT
COVER BACK TABLE 60X90IN (DRAPES) ×2 IMPLANT
COVER TIP SHEARS 8 DVNC (MISCELLANEOUS) ×1 IMPLANT
COVER TIP SHEARS 8MM DA VINCI (MISCELLANEOUS) ×2
COVER WAND RF STERILE (DRAPES) IMPLANT
DECANTER SPIKE VIAL GLASS SM (MISCELLANEOUS) ×1 IMPLANT
DERMABOND ADVANCED (GAUZE/BANDAGES/DRESSINGS) ×1
DERMABOND ADVANCED .7 DNX12 (GAUZE/BANDAGES/DRESSINGS) ×1 IMPLANT
DRAPE ARM DVNC X/XI (DISPOSABLE) ×4 IMPLANT
DRAPE COLUMN DVNC XI (DISPOSABLE) ×1 IMPLANT
DRAPE DA VINCI XI ARM (DISPOSABLE) ×8
DRAPE DA VINCI XI COLUMN (DISPOSABLE) ×2
DRAPE SHEET LG 3/4 BI-LAMINATE (DRAPES) ×2 IMPLANT
DRAPE SURG IRRIG POUCH 19X23 (DRAPES) ×2 IMPLANT
DRSG OPSITE POSTOP 4X6 (GAUZE/BANDAGES/DRESSINGS) ×1 IMPLANT
DRSG OPSITE POSTOP 4X8 (GAUZE/BANDAGES/DRESSINGS) IMPLANT
ELECT PENCIL ROCKER SW 15FT (MISCELLANEOUS) ×1 IMPLANT
ELECT REM PT RETURN 15FT ADLT (MISCELLANEOUS) ×2 IMPLANT
GLOVE BIO SURGEON STRL SZ 6 (GLOVE) ×8 IMPLANT
GLOVE BIO SURGEON STRL SZ 6.5 (GLOVE) ×4 IMPLANT
GOWN STRL REUS W/ TWL LRG LVL3 (GOWN DISPOSABLE) ×4 IMPLANT
GOWN STRL REUS W/TWL LRG LVL3 (GOWN DISPOSABLE) ×8
HOLDER FOLEY CATH W/STRAP (MISCELLANEOUS) ×1 IMPLANT
IRRIG SUCT STRYKERFLOW 2 WTIP (MISCELLANEOUS) ×2
IRRIGATION SUCT STRKRFLW 2 WTP (MISCELLANEOUS) ×1 IMPLANT
KIT PROCEDURE DA VINCI SI (MISCELLANEOUS) ×2
KIT PROCEDURE DVNC SI (MISCELLANEOUS) IMPLANT
KIT TURNOVER KIT A (KITS) IMPLANT
MANIPULATOR UTERINE 4.5 ZUMI (MISCELLANEOUS) ×2 IMPLANT
NDL SPNL 18GX3.5 QUINCKE PK (NEEDLE) IMPLANT
NEEDLE HYPO 22GX1.5 SAFETY (NEEDLE) ×3 IMPLANT
NEEDLE SPNL 18GX3.5 QUINCKE PK (NEEDLE) ×2 IMPLANT
OBTURATOR OPTICAL STANDARD 8MM (TROCAR) ×2
OBTURATOR OPTICAL STND 8 DVNC (TROCAR) ×1
OBTURATOR OPTICALSTD 8 DVNC (TROCAR) ×1 IMPLANT
PACK ROBOT GYN CUSTOM WL (TRAY / TRAY PROCEDURE) ×2 IMPLANT
PAD POSITIONING PINK XL (MISCELLANEOUS) ×2 IMPLANT
PORT ACCESS TROCAR AIRSEAL 12 (TROCAR) ×1 IMPLANT
PORT ACCESS TROCAR AIRSEAL 5M (TROCAR) ×1
POUCH SPECIMEN RETRIEVAL 10MM (ENDOMECHANICALS) IMPLANT
RETRACTOR LAPSCP 12X46 CVD (ENDOMECHANICALS) IMPLANT
RTRCTR LAPSCP 12X46 CVD (ENDOMECHANICALS) ×2
SCRUB CHG 4% DYNA-HEX 4OZ (MISCELLANEOUS) ×1 IMPLANT
SEAL CANN UNIV 5-8 DVNC XI (MISCELLANEOUS) ×3 IMPLANT
SEAL XI 5MM-8MM UNIVERSAL (MISCELLANEOUS) ×8
SEALER VESSEL DA VINCI XI (MISCELLANEOUS) ×2
SEALER VESSEL EXT DVNC XI (MISCELLANEOUS) IMPLANT
SET TRI-LUMEN FLTR TB AIRSEAL (TUBING) ×2 IMPLANT
SPONGE LAP 18X18 RF (DISPOSABLE) ×1 IMPLANT
SURGIFLO W/THROMBIN 8M KIT (HEMOSTASIS) IMPLANT
SUT MNCRL AB 4-0 PS2 18 (SUTURE) ×2 IMPLANT
SUT PDS AB 1 TP1 96 (SUTURE) ×2 IMPLANT
SUT VIC AB 0 CT1 27 (SUTURE)
SUT VIC AB 0 CT1 27XBRD ANTBC (SUTURE) IMPLANT
SUT VIC AB 2-0 CT1 27 (SUTURE) ×2
SUT VIC AB 2-0 CT1 TAPERPNT 27 (SUTURE) IMPLANT
SUT VIC AB 3-0 CTX 36 (SUTURE) ×1 IMPLANT
SUT VIC AB 4-0 PS2 18 (SUTURE) ×5 IMPLANT
SYR 10ML LL (SYRINGE) ×1 IMPLANT
SYR 20ML LL LF (SYRINGE) ×2 IMPLANT
TOWEL OR NON WOVEN STRL DISP B (DISPOSABLE) ×2 IMPLANT
TRAP SPECIMEN MUCUS 40CC (MISCELLANEOUS) IMPLANT
TRAY FOLEY MTR SLVR 16FR STAT (SET/KITS/TRAYS/PACK) ×2 IMPLANT
TROCAR XCEL NON-BLD 5MMX100MML (ENDOMECHANICALS) IMPLANT
TUBING CONNECTING 10 (TUBING) ×1 IMPLANT
UNDERPAD 30X36 HEAVY ABSORB (UNDERPADS AND DIAPERS) ×2 IMPLANT
WATER STERILE IRR 1000ML POUR (IV SOLUTION) ×2 IMPLANT
YANKAUER SUCT BULB TIP NO VENT (SUCTIONS) IMPLANT

## 2020-08-29 NOTE — Transfer of Care (Signed)
Immediate Anesthesia Transfer of Care Note  Patient: Sarah Bean  Procedure(s) Performed: Procedure(s): XI ROBOTIC ASSISTED TOTAL HYSTERECTOMY WITH BILATERAL SALPINGO OOPHORECTOMY, GREATER THAN 250 GRAMS, MINI LAPAROTOMY (N/A)  Patient Location: PACU  Anesthesia Type:General  Level of Consciousness: Alert, Awake, Oriented  Airway & Oxygen Therapy: Patient Spontanous Breathing  Post-op Assessment: Report given to RN  Post vital signs: Reviewed and stable  Last Vitals:  Vitals:   08/29/20 0634  BP: (!) 143/84  Pulse: 75  Resp: 16  Temp: 37.1 C  SpO2: 97%    Complications: No apparent anesthesia complications

## 2020-08-29 NOTE — Op Note (Signed)
OPERATIVE NOTE 08/29/20  Surgeon: Donaciano Eva   Assistants: Dr Lahoma Crocker (an MD assistant was necessary for tissue manipulation, management of robotic instrumentation, retraction and positioning due to the complexity of the case and hospital policies).   Anesthesia: General endotracheal anesthesia  ASA Class: 3   Pre-operative Diagnosis: endometrial cancer grade 1, extreme morbid obesity, fibroid uterus  Post-operative Diagnosis: same,   Operation: Robotic-assisted laparoscopic total hysterectomy >250gm,  with bilateral salpingoophorectomy. Minilaparotomy for specimen delivery.  22 modifier for extreme abdominal adiposity and very large broad uterus (1300gm) making surgical visualization difficult and limited and increasing the duration of the procedure by 2 hours for time spent in performing extra steps (see below for details).    Surgeon: Donaciano Eva  Assistant Surgeon: Lahoma Crocker MD  Anesthesia: GET  Urine Output: 100cc  Operative Findings:  : morbid obesity, abdominal pannus, BMI 45kg/m2, intraperitoneal high volume fat and fatty bowel and viscera, 20cm uterus spanning sidewall to sidewall with limited sidewall and vessel visualization/exposuer. Normal appearing ovaries bilaterally.  Uterus weighed 1300gm. Unable to perform SLN mapping due to extreme obesity and large bulky uterus with prevented visualization of the sidewall.   Estimated Blood Loss:  less than 50 mL      Total IV Fluids: 800 ml         Specimens: uterus, cervix, bilateral tubes and ovaries         Complications:  None; patient tolerated the procedure well.         Disposition: PACU - hemodynamically stable.  Procedure Details  The patient was seen in the Holding Room. The risks, benefits, complications, treatment options, and expected outcomes were discussed with the patient.  The patient concurred with the proposed plan, giving informed consent.  The site of surgery  properly noted/marked. The patient was identified as Sarah Bean and the procedure verified as a Robotic-assisted hysterectomy with bilateral salpingo oophorectomy with SLN biopsy. A Time Out was held and the above information confirmed.  After induction of anesthesia, the patient was draped and prepped in the usual sterile manner. However, additional time was necessary for patient positioning and achieving additional IV access due to her extreme adiposity Pt was placed in supine position after anesthesia and draped and prepped in the usual sterile manner. The abdominal drape was placed after the CholoraPrep had been allowed to dry for 3 minutes.  Her arms were tucked to her side with all appropriate precautions.  The shoulders were stabilized with padded shoulder blocks applied to the acromium processes.  Abdominal pannus was taped.  The patient was placed in the semi-lithotomy position in [place].  The perineum was prepped with Betadine. The patient was then prepped. Foley catheter was placed.  A sterile speculum was placed in the vagina.  The cervix was grasped with a single-tooth tenaculum. 2mg  total of ICG was injected into the cervical stroma at 2 and 9 o'clock with 1cc injected at a 1cm and 16mm depth (concentration 0.5mg /ml) in all locations. The cervix was dilated with Kennon Rounds dilators.  The ZUMI uterine manipulator with a medium colpotomizer ring was placed without difficulty.  A pneum occluder balloon was placed over the manipulator.  OG tube placement was confirmed and to suction.   Next, a 5 mm skin incision was made 1 cm below the subcostal margin in the midclavicular line.  The 5 mm Optiview port and scope was used for direct entry.  Opening pressure was under 10 mm CO2.  The abdomen  was insufflated and the findings were noted as above.   At this point and all points during the procedure, the patient's intra-abdominal pressure did not exceed 15 mmHg. Next, a 10 mm skin incision was made above  the umbilicus and a right and left port was placed about 10 cm lateral to the robot port on the right and left side.  A fourth arm was placed in the left lower quadrant 2 cm above and superior and medial to the anterior superior iliac spine.  All ports were placed under direct visualization.  The patient was placed in steep Trendelenburg.  Bowel could not be folded away into the upper abdomen due to extreme obesity and retroperitoneal adiposity therefore an endopaddle was used to provide some retraction. This necessitated placement of an additional left upper quadrant 57mm assistant port for suctioning . The robot was docked in the normal manner.  Due to the extreme adiposity and bulky uterus, there could be no safe visualization of the retroperitoneal structures and therefore staging was not performed.   Due to limited exposure, a vessel sealer was used to enable vessel sealing and transection with one instrument so that the other instruments could retract fat. The hysterectomy was started after the round ligament on the right side was incised and the retroperitoneum was entered and the pararectal space was developed.  The ureter was noted to be on the medial leaf of the broad ligament.  The peritoneum above the ureter was incised and stretched and the infundibulopelvic ligament was skeletonized, cauterized and cut.  The posterior peritoneum was taken down to the level of the KOH ring.  The anterior peritoneum was also taken down.  The bladder flap was created to the level of the KOH ring.  The uterine artery on the right side was skeletonized, cauterized and cut in the normal manner.  A similar procedure was performed on the left.  The colpotomy was made and the uterus, cervix, bilateral ovaries and tubes were amputated but could not be delivered through the vagina.    Pedicles were inspected and excellent hemostasis was achieved.    The colpotomy at the vaginal cuff was closed with Vicryl on a CT1 needle  in a running manner.  Irrigation was used and excellent hemostasis was achieved.  At this point in the robotic procedure was completed.  Robotic instruments were removed under direct visulaization.  The robot was undocked.   A 10cm epigastric vertical midline incision was made with the scalpel using the prior midline port site to extend. The subcutaneous skin was opened with the bovie. The fascia was opened with the bovie verticly. The peritoneum was opened sharply in the midline. The peritoneal incision was extended. The uterine specimen in the endocatch bag was retrieved through the incision. The fascia was closed with running looped PDS mass closure. The subcutaneous fat was closed with 3-0 vicryl. 20cc of exparel mixed with 20cc of marcaine was infiltrated into the incision. The incision was closed at the skin with monocryl and dermabond.   The 10 mm ports were closed with Vicryl on a UR-5 needle and the fascia was closed with 0 Vicryl on a UR-5 needle.  The skin was closed with 4-0 Vicryl in a subcuticular manner.  Dermabond was applied.  Sponge, lap and needle counts correct x 2.  The patient was taken to the recovery room in stable condition.  The vagina was swabbed with  minimal bleeding noted.   All instrument and needle counts were correct  x  3.   The patient was transferred to the recovery room in a stable condition.  This procedure had a duration 2 hours longer than typical robotic hysterectomies due to additional time spent in positioning, additional IV access, additional port placement, retrieving additional equipment not typically used, time spent retracting adiposity, time spent performing minilaparotomy for specimen delivery, additional suturing for extra incisions.   Donaciano Eva, MD

## 2020-08-29 NOTE — Anesthesia Postprocedure Evaluation (Signed)
Anesthesia Post Note  Patient: Sarah Bean  Procedure(s) Performed: XI ROBOTIC ASSISTED TOTAL HYSTERECTOMY WITH BILATERAL SALPINGO OOPHORECTOMY, GREATER THAN 250 GRAMS, MINI LAPAROTOMY (N/A )     Patient location during evaluation: PACU Anesthesia Type: General Level of consciousness: awake and alert Pain management: pain level controlled Vital Signs Assessment: post-procedure vital signs reviewed and stable Respiratory status: spontaneous breathing, nonlabored ventilation and respiratory function stable Cardiovascular status: blood pressure returned to baseline and stable Postop Assessment: no apparent nausea or vomiting Anesthetic complications: no   No complications documented.  Last Vitals:  Vitals:   08/29/20 1308 08/29/20 1415  BP: (!) 115/101 137/73  Pulse: 86 73  Resp: 18 12  Temp: 36.4 C (!) 36.3 C  SpO2: 97% 95%    Last Pain:  Vitals:   08/29/20 1415  TempSrc:   PainSc: [place]

## 2020-08-29 NOTE — Anesthesia Procedure Notes (Addendum)
Procedure Name: Intubation Date/Time: 08/29/2020 7:42 AM Performed by: Gerald Leitz, CRNA Pre-anesthesia Checklist: Patient identified, Patient being monitored, Timeout performed, Emergency Drugs available and Suction available Patient Re-evaluated:Patient Re-evaluated prior to induction Oxygen Delivery Method: Circle system utilized Preoxygenation: Pre-oxygenation with 100% oxygen Induction Type: IV induction and Rapid sequence Ventilation: Mask ventilation without difficulty Laryngoscope Size: Mac and 3 Grade View: Grade I Tube type: Oral Tube size: 7.5 mm Number of attempts: 1 Airway Equipment and Method: Stylet and Video-laryngoscopy Placement Confirmation: ETT inserted through vocal cords under direct vision,  positive ETCO2 and breath sounds checked- equal and bilateral Secured at: 22 cm Tube secured with: Tape Dental Injury: Teeth and Oropharynx as per pre-operative assessment  Difficulty Due To: Difficulty was anticipated and Difficult Airway- due to large tongue Future Recommendations: Recommend- induction with short-acting agent, and alternative techniques readily available

## 2020-08-29 NOTE — Interval H&P Note (Signed)
History and Physical Interval Note:  08/29/2020 7:14 AM  Sarah Bean  has presented today for surgery, with the diagnosis of ENDOMETRIAL CANCER.  The various methods of treatment have been discussed with the patient and family. After consideration of risks, benefits and other options for treatment, the patient has consented to  Procedure(s): XI ROBOTIC ASSISTED TOTAL HYSTERECTOMY WITH BILATERAL SALPINGO OOPHORECTOMY, GREATER THAN 250 GRAMS, MINI LAPAROTOMY (N/A) SENTINEL NODE BIOPSY (N/A) as a surgical intervention.  The patient's history has been reviewed, patient examined, no change in status, stable for surgery.  I have reviewed the patient's chart and labs.  Questions were answered to the patient's satisfaction.     Thereasa Solo

## 2020-08-29 NOTE — Discharge Instructions (Signed)
Return to work: 4 weeks (2 weeks with physical restrictions).  Activity: 1. Be up and out of the bed during the day.  Take a nap if needed.  You may walk up steps but be careful and use the hand rail.  Stair climbing will tire you more than you think, you may need to stop part way and rest.   2. No lifting or straining for 4 weeks.  3. No driving for 1 weeks.  Do Not drive if you are taking narcotic pain medicine.  4. Shower daily.  Use soap and water on your incision and pat dry; don't rub.   5. No sexual activity and nothing in the vagina for 8 weeks.  Medications:  - Take ibuprofen and tylenol first line for pain control. Take these regularly (every 6 hours) to decrease the build up of pain.  - If necessary, for severe pain not relieved by ibuprofen, contact Dr Serita Grit office and you will be prescribed percocet.  - While taking percocet you should take sennakot every night to reduce the likelihood of constipation. If this causes diarrhea, stop its use.  Diet: 1. Low sodium Heart Healthy Diet is recommended.  2. It is safe to use a laxative if you have difficulty moving your bowels.   Wound Care: 1. Keep clean and dry.  Shower daily. 2. You can get the dressing wet in the shower, but avoid tub baths. 3. Remove the dressing between 5 and 7 days after your surgery (1 week after your operation). 4. After the dressing is removed you can get the incision wet in the shower, however continue to avoid tub baths until advised otherwise by your surgeon at follow-up.   Reasons to call the Doctor:   Fever - Oral temperature greater than 100.4 degrees Fahrenheit  Foul-smelling vaginal discharge  Difficulty urinating  Nausea and vomiting  Increased pain at the site of the incision that is unrelieved with pain medicine.  Difficulty breathing with or without chest pain  New calf pain especially if only on one side  Sudden, continuing increased vaginal bleeding with or without  clots.   Follow-up: 1. See Everitt Amber in 4 weeks.  Contacts: For questions or concerns you should contact:  Dr. Everitt Amber at [phone] After hours and on week-ends call [phone] and ask to speak to the physician on call for Gynecologic Oncology  After Your Surgery  The information in this section will tell you what to expect after your surgery, both during your stay and after you leave. You will learn how to safely recover from your surgery. Write down any questions you have and be sure to ask your doctor or nurse.  What to Expect When you wake up after your surgery, you will be in the [facility] ([facility]) or your recovery room. A nurse will be monitoring your body temperature, blood pressure, pulse, and oxygen levels. You may have a urinary catheter in your bladder to help monitor the amount of urine you are making. It should come out before you go home. You will also have compression boots on your lower legs to help your circulation. Your pain medication will be given through an IV line or in tablet form. If you are having pain, tell your nurse. Your nurse will tell you how to recover from your surgery. Below are examples of ways you can help yourself recover safely. . You will be encouraged to walk with the help of your nurse or physical therapist.  We will give you medication to relieve pain. Walking helps reduce the risk for blood clots and pneumonia. It also helps to stimulate your bowels so they begin working again. . Use your incentive spirometer. This will help your lungs expand, which prevents pneumonia.   Commonly Asked Questions  Will I have pain after surgery? Yes, you will have some pain after your surgery, especially in the first few days. Your doctor and nurse will ask you about your pain often. You will be given medication to manage your pain as needed. If your pain is not relieved, please tell your doctor or nurse. It is important to control your pain  so you can cough, breathe deeply, use your incentive spirometer, and get out of bed and walk.  Will I be able to eat? Yes, you will be able to eat a regular diet or eat as tolerated. You should start with foods that are soft and easy to digest such as apple sauce and chicken noodle soup. Eat small meals frequently, and then advance to regular foods. If you experience bloating, gas, or cramps, limit high-fiber foods, including whole grain breads and cereal, nuts, seeds, salads, fresh fruit, broccoli, cabbage, and cauliflower. Will I have pain when I am home? The length of time each person has pain or discomfort varies. You may still have some pain when you go home and will probably be taking pain medication. Follow the guidelines below. . Take your medications as directed and as needed. . Call your doctor if the medication prescribed for you doesn't relieve your pain. . Don't drive or drink alcohol while you're taking prescription pain medication. . As your incision heals, you will have less pain and need less pain medication. A mild pain reliever such as acetaminophen (Tylenol) or ibuprofen (Advil) will relieve aches and discomfort. However, large quantities of acetaminophen may be harmful to your liver. Don't take more acetaminophen than the amount directed on the bottle or as instructed by your doctor or nurse. . Pain medication should help you as you resume your normal activities. Take enough medication to do your exercises comfortably. Pain medication is most effective 30 to 45 minutes after taking it. Marland Kitchen Keep track of when you take your pain medication. Taking it when your pain first begins is more effective than waiting for the pain to get worse. Pain medication may cause constipation (having fewer bowel movements than what is normal for you).  How can I prevent constipation? . Go to the bathroom at the same time every day. Your body will get used to going at that time. . If you feel the urge  to go, don't put it off. Try to use the bathroom 5 to 15 minutes after meals. . After breakfast is a good time to move your bowels. The reflexes in your colon are strongest at this time. . Exercise, if you can. Walking is an excellent form of exercise. . Drink 8 (8-ounce) glasses (2 liters) of liquids daily, if you can. Drink water, juices, soups, ice cream shakes, and other drinks that don't have caffeine. Drinks with caffeine, such as coffee and soda, pull fluid out of the body. . Slowly increase the fiber in your diet to 25 to 35 grams per day. Fruits, vegetables, whole grains, and cereals contain fiber. If you have an ostomy or have had recent bowel surgery, check with your doctor or nurse before making any changes in your diet. . Both over-the-counter and prescription medications are available to treat constipation.  Start with 1 of the following over-the-counter medications first: o Docusate sodium (Colace) 100 mg. Take ___1__ capsules _2____ times a day. This is a stool softener that causes few side effects. Don't take it with mineral oil. o Polyethylene glycol (MiraLAX) 17 grams daily. o Senna (Senokot) 2 tablets at bedtime. This is a stimulant laxative, which can cause cramping. . If you haven't had a bowel movement in 2 days, call your doctor or nurse.  Can I shower? Yes, you should shower 24 hours after your surgery. Be sure to shower every day. Taking a warm shower is relaxing and can help decrease muscle aches. Use soap when you shower and gently wash your incision. Pat the areas dry with a towel after showering, and leave your incision uncovered (unless there is drainage). Call your doctor if you see any redness or drainage from your incision. Don't take tub baths until you discuss it with your doctor at the first appointment after your surgery. How do I care for my incisions? You will have several small incisions on your abdomen. The incisions are closed with Steri-Strips or  Dermabond. You may also have square white dressings on your incisions (Primapore). You can remove these in the shower 24 hours after your surgery. You should clean your incisions with soap and water. If you go home with Steri-Strips on your incision, they will loosen and may fall off by themselves. If they haven't fallen off within 10 days, you can remove them. If you go home with Dermabond over your sutures (stitches), it will also loosen and peel off.  What are the most common symptoms after a hysterectomy? It's common for you to have some vaginal spotting or light bleeding. You should monitor this with a pad or a panty liner. If you have having heavy bleeding (bleeding through a pad or liner every 1 to 2 hours), call your doctor right away. It's also common to have some discomfort after surgery from the air that was pumped into your abdomen during surgery. To help with this, walk, drink plenty of liquids and make sure to take the stool softeners you received.  When is it safe for me to drive? You may resume driving 2 weeks after surgery, as long as you are not taking pain medication that may make you drowsy.  When can I resume sexual activity? Do not place anything in your vagina or have vaginal intercourse for 8 weeks after your surgery. Some people will need to wait longer than 8 weeks, so speak with your doctor before resuming sexual intercourse.  Will I be able to travel? Yes, you can travel. If you are traveling by plane within a few weeks after your surgery, make sure you get up and walk every hour. Be sure to stretch your legs, drink plenty of liquids, and keep your feet elevated when possible.  Will I need any supplies? Most people do not need any supplies after the surgery. In the rare case that you do need supplies, such as tubes or drains, your nurse will order them for you.  When can I return to work? The time it takes to return to work depends on the type of work you do, the  type of surgery you had, and how fast your body heals. Most people can return to work about 2 to 4 weeks after the surgery.  What exercises can I do? Exercise will help you gain strength and feel better. Walking and stair climbing are excellent forms of exercise.  Gradually increase the distance you walk. Climb stairs slowly, resting or stopping as needed. Ask your doctor or nurse before starting more strenuous exercises.  When can I lift heavy objects? Most people should not lift anything heavier than 10 pounds (4.5 kilograms) for at least 4 weeks after surgery. Speak with your doctor about when you can do heavy lifting.  How can I cope with my feelings? After surgery for a serious illness, you may have new and upsetting feelings. Many people say they felt weepy, sad, worried, nervous, irritable, and angry at one time or another. You may find that you can't control some of these feelings. If this happens, it's a good idea to seek emotional support. The first step in coping is to talk about how you feel. Family and friends can help. Your nurse, doctor, and social worker can reassure, support, and guide you. It's always a good idea to let these professionals know how you, your family, and your friends are feeling emotionally. Many resources are available to patients and their families. Whether you're in the hospital or at home, the nurses, doctors, and social workers are here to help you and your family and friends handle the emotional aspects of your illness.  When is my first appointment after surgery? Your first appointment after surgery will be 2 to 4 weeks after surgery. Your nurse will give you instructions on how to make this appointment, including the phone number to call.  What if I have other questions? If you have any questions or concerns, please talk with your doctor or nurse. You can reach them Monday through Friday from 9:00 am to 5:00 pm. After 5:00 pm, during the weekend, and on  holidays, call [phone] and ask for the doctor on call for your doctor.  . Have a temperature of 101 F (38.3 C) or higher . Have pain that does not get better with pain medication . Have redness, drainage, or swelling from your incisions

## 2020-08-30 ENCOUNTER — Telehealth: Payer: Self-pay

## 2020-08-30 ENCOUNTER — Encounter (HOSPITAL_COMMUNITY): Payer: Self-pay | Admitting: Gynecologic Oncology

## 2020-08-30 NOTE — Telephone Encounter (Signed)
Sarah Bean states that she vomited 6 times last evening. She is drinking ok this am.  Told her she needed to push the fluids 8 oz every hour to 90 minutes today.  She has not tried to eat anything today. Told her to call the office she she can not keep any fluids or food down today. Some times the bowel does not wake up quickly after surgery and can cause N/V and abdominal pain. Pt verbalized understanding. She is not passing gas. She did begin the senokot-s yesterday.  Told her to increase it to 2 tabs with 8 oz of water bid.  If no BM by Friday 09-01-20, she is to add a capful of Miralax bid. Afebrile She took an Ibuprofen 800 mg at 6:45 am.  Told her to take an extra strength tylenol and alternate tylenol and ibuprofen. Her pain level now is a 10/10.  Told her to take a tramadol now.  She can use this for severe pain.  It will make her sleepy. Incisions are D&I. Told her that she can remove the honeycomb dressing 5-7 days after surgery which would be Sunday 09-03-20 through 08-26-20. Sarah Bean given post op appointments as well as the office number to call [phone] if she has any questions or concerns.

## 2020-09-05 ENCOUNTER — Inpatient Hospital Stay: Payer: Medicaid Other | Attending: Gynecologic Oncology | Admitting: Gynecologic Oncology

## 2020-09-05 DIAGNOSIS — C541 Malignant neoplasm of endometrium: Secondary | ICD-10-CM | POA: Insufficient documentation

## 2020-09-05 DIAGNOSIS — Z90722 Acquired absence of ovaries, bilateral: Secondary | ICD-10-CM | POA: Insufficient documentation

## 2020-09-05 DIAGNOSIS — Z90710 Acquired absence of both cervix and uterus: Secondary | ICD-10-CM | POA: Insufficient documentation

## 2020-09-06 ENCOUNTER — Ambulatory Visit: Payer: Medicaid Other | Admitting: Gynecologic Oncology

## 2020-09-06 ENCOUNTER — Telehealth: Payer: Self-pay | Admitting: Gynecologic Oncology

## 2020-09-06 LAB — SURGICAL PATHOLOGY

## 2020-09-06 NOTE — Telephone Encounter (Signed)
Post op telephone call to check patient status.  Patient describes expected post operative status.  Adequate PO intake reported.  Bowels and bladder functioning without difficulty and having loose stools (not using senna but took a dose of Miralax).  Pain minimal.  Reportable signs and symptoms reviewed.  Follow up appt confirmed. Final pathology discussed with the recommendation for adjuvant vaginal brachytherapy. Attempted to answer all questions but patient sounded slightly distressed about the information. Advised her this would be discussed further at her upcoming appt. Advised to call for any needs.

## 2020-09-18 ENCOUNTER — Ambulatory Visit: Payer: Medicaid Other | Admitting: Gynecologic Oncology

## 2020-09-18 ENCOUNTER — Encounter: Payer: Self-pay | Admitting: Gynecologic Oncology

## 2020-09-19 ENCOUNTER — Telehealth: Payer: Self-pay | Admitting: Oncology

## 2020-09-19 ENCOUNTER — Encounter: Payer: Self-pay | Admitting: Gynecologic Oncology

## 2020-09-19 ENCOUNTER — Other Ambulatory Visit: Payer: Self-pay

## 2020-09-19 ENCOUNTER — Inpatient Hospital Stay (HOSPITAL_BASED_OUTPATIENT_CLINIC_OR_DEPARTMENT_OTHER): Payer: Medicaid Other | Admitting: Gynecologic Oncology

## 2020-09-19 VITALS — BP 143/80 | HR 80 | Temp 97.6°F | Resp 20 | Wt 245.6 lb

## 2020-09-19 DIAGNOSIS — Z90710 Acquired absence of both cervix and uterus: Secondary | ICD-10-CM | POA: Diagnosis not present

## 2020-09-19 DIAGNOSIS — Z90722 Acquired absence of ovaries, bilateral: Secondary | ICD-10-CM

## 2020-09-19 DIAGNOSIS — C541 Malignant neoplasm of endometrium: Secondary | ICD-10-CM | POA: Diagnosis present

## 2020-09-19 DIAGNOSIS — Z7189 Other specified counseling: Secondary | ICD-10-CM

## 2020-09-19 NOTE — Patient Instructions (Signed)
Dr Denman George recommends avoiding heavy lifting for an additional 1 week. She avoids intercourse or placing anything in the vagina for another 1 month.  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  Therefore Dr. Denman George is recommending 5 treatments of radiation to the vagina.  Her office will schedule you to meet with the radiation doctor,Dr Kinard, to discuss what these would involve.  She anticipates that you would start treatment at the end of December or beginning of the new year.  Typically 5 treatments are given.  Following treatment with radiation Dr. Denman George will see you periodically over the next 5 years for checkups to ensure there is no sign of the cancer regrowing.

## 2020-09-19 NOTE — Progress Notes (Signed)
Follow-up Note: Gyn-Onc  Consult was requested by Dr. [name] for the evaluation of Sarah Bean 54 y.o. female  CC:  Chief Complaint  Patient presents with   Endometrial cancer [facility])    Assessment/Plan:  Ms. MARIABELEN Bean  is a 54 y.o.  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 MMR normal/MSI stable.  High/intermediate risk factors for recurrence. Recommendation is for vaginal brachytherapy to reduce risk for local recurrence in accordance with NCCN guidelines.  I discussed this with the patient. I discussed the role of adjuvant therapy. I discussed prognosis and risk for recurrence. We reviewed symptoms concerning for recurrence and she will see me if these develop prior to her scheduled appointment.  After completing adjuvant therapy I recommend she follow-up at 3 monthly intervals for symptom review, physical examination and pelvic examination. Pap smear is not recommended in routine endometrial cancer surveillance. After 2 years we will space these visits to every 6 months, and then annually if recurrence has not developed within 5 years. All questions were answered.    HPI: [name] is a 54 year old P 3 who was seen in consultation at the request of Dr [name] for evaluation and treatment of grade 2 endometrial cancer.   Her symptoms began approximately 10 years prior to diagnosis with abnormal uterine bleeding.  [name]  When her symptoms escalated with respect to the volume of bleeding she sought evaluation with Dr. Elly Modena.  She saw Dr. Elly Modena for abnormal uterine bleeding on 06/22/2020.  Work-up of symptoms included a transvaginal ultrasound scan and endometrial biopsy. Transvaginal US on June 22, 2020 showed a uterus measuring 14.9 x 12.3 x 10.5 cm.  There were multiple fibroids  within the uterus including a partially exophytic posterior uterine wall fibroid measuring 8 cm.  The endometrium was thick at 19 mm.  The left and right ovaries were not seen. 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   The patient is morbidly obese with a BMI of 46 kg meters squared.  She last had her hemoglobin A1c checked in 2020 and was normal at that time.  She has diagnosed hypertension, manic depression, obstructive sleep apnea for which she uses a CPAP device, and had a stroke in 2018 that sounds like it was a TIA with complete resolution of neurologic symptoms.  She is treated with 81 mg of aspirin for this.  Past surgical history is most remarkable for 3 prior cesarean sections.  Interval Hx:  On 08/29/20 she underwent robotic assisted total hysterectomy for uterus greater than 250 g, [name], mini laparotomy for specimen delivery. Intraoperative findings were significant for morbid obesity with a BMI of 45 kg meters squared, a 20 cm uterus spanning sidewall to sidewall with limited sidewall visualization and exposure.  Normal ovaries bilaterally.  The uterus weighed 1300 g in the operating room.  Sentinel lymph node mapping was unable to be performed due to extreme abdominal obesity and the large bulky uterus preventing visualization of the sidewalls. Surgery was challenging due to body habitus but uncomplicated.  She required mini laparotomy for specimen delivery.  Final pathology revealed a grade 2 endometrioid endometrial adenocarcinoma with 2 mm of 41 mm myometrial invasion (inner half).  LVSI was present but focal.  The adnexa and cervix were negative for metastatic carcinoma. MMR intact, MSI stable.  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 She was determined to have a high/intermediate risk factors for recurrence and adjuvant vaginal  brachytherapy radiation was recommended in accordance with NCCN guidelines to decrease risk for recurrence.  Since surgery she has done well with no complaints other than generalized soreness.  Current Meds:  Outpatient Encounter Medications as of 09/19/2020  Medication Sig   albuterol (VENTOLIN HFA) 108 (90 Base) MCG/ACT inhaler [name]   ibuprofen (ADVIL) 800 MG tablet [name]   lisinopril-hydrochlorothiazide (ZESTORETIC) 20-25 MG tablet [name]   megestrol (MEGACE) 40 MG tablet Take 1 tablet (40 mg total) by mouth daily. Can increase to two tablets twice a day in the event of heavy bleeding (Patient taking differently: Take 40-80 mg by mouth See admin instructions. Can increase to 80 mg tablets twice a day in the event of heavy bleeding)   polyethylene glycol (MIRALAX / GLYCOLAX) 17 g packet Take 17 g by mouth daily as needed.   QUEtiapine (SEROQUEL) 100 MG tablet Take 100 mg by mouth at bedtime.   senna-docusate (SENOKOT-S) 8.6-50 MG tablet Take 2 tablets by mouth at bedtime. For AFTER surgery, do not take if having diarrhea   sertraline (ZOLOFT) 100 MG tablet [name]   traMADol (ULTRAM) 50 MG tablet Take 1 tablet (50 mg total) by mouth every 6 (six) hours as needed for severe pain. For AFTER surgery only, do not take and drive   traZODone (DESYREL) 100 MG tablet [name]    aspirin EC 81 MG tablet Take 1 tablet (81 mg total) by mouth daily. Swallow whole. (Patient not taking: Reported on 09/18/2020)   No facility-administered encounter medications on file as of 09/19/2020.    Allergy: No Known Allergies  Social Hx:   Social History   Socioeconomic History   Marital status: Legally Separated    Spouse name: Not on  file   Number of children: Not on file   Years of education: Not on file   Highest education level: Not on file  Occupational History   Occupation: disabled  Tobacco Use   Smoking status: Never Smoker   Smokeless tobacco: Never Used  Scientific laboratory technician Use: Never used  Substance and Sexual Activity   Alcohol use: Yes    Comment: very seldom   Drug use: Never   Sexual activity: Not Currently    Partners: Male    Birth control/protection: None, [name], who has autism. He vocalizes and acts out.   Social Determinants of Health   Financial Resource Strain:    Difficulty of Paying Living Expenses: Not on file  Food Insecurity:    Worried About Charity fundraiser in the Last Year: Not on file   [employer] of Food in the Last Year: Not on file  Transportation Needs:    Lack of Transportation (Medical): Not on file   Lack of Transportation (Non-Medical): Not on file  Physical Activity:    Days of Exercise per  Week: Not on file   Minutes of Exercise per Session: Not on file  Stress:    Feeling of Stress : Not on file  Social Connections:    Frequency of Communication with Friends and Family: Not on file   Frequency of Social Gatherings with Friends and Family: Not on file   Attends Religious Services: Not on file   Active Member of Clubs or Organizations: Not on file   Attends Archivist Meetings: Not on file   Marital Status: Not on file  Intimate Partner Violence:    Fear of Current or Ex-Partner: Not on file   Emotionally Abused: Not on file   Physically Abused: Not on file   Sexually Abused: Not on file    Past Surgical Hx:  Past Surgical History:  Procedure Laterality Date   BREAST LUMPECTOMY Left    CESAREAN SECTION     x3   ROBOTIC ASSISTED TOTAL HYSTERECTOMY WITH BILATERAL SALPINGO OOPHERECTOMY N/A 08/29/2020    Procedure: XI ROBOTIC ASSISTED TOTAL HYSTERECTOMY WITH BILATERAL SALPINGO OOPHORECTOMY, GREATER THAN 250 GRAMS, MINI LAPAROTOMY;  [name], MD;  [name];  Service: Gynecology;  [name];   TUBAL LIGATION Bilateral 1990    Past Medical Hx:  Past Medical History:  Diagnosis Date   Anemia    Anxiety    Asthma    Depression    Essential hypertension 05/26/2017   GERD (gastroesophageal reflux disease)    Insomnia    Manic depression ([place])    Obesity    Pneumonia    history of   Restless leg syndrome    Stroke [facility])    Uterine cancer [facility])     Past Gynecological History:  Pap in 2021 normal and negative high risk HPV No LMP recorded.  Family Hx:  Family History  Problem Relation Age of Onset   CAD Mother 66   Seizures Mother    Other Father 30       struck by lightning   Seizures Brother    Cancer Maternal Aunt        lung   Cancer Maternal Grandmother        breast   Cancer Paternal Grandmother        cervical   Multiple sclerosis Daughter    Autism Son    Stroke Neg Hx     Review of Systems:  Constitutional  Feels well,    ENT Normal appearing ears and nares bilaterally Skin/Breast  No rash, sores, jaundice, itching, dryness Cardiovascular  No chest pain, shortness of breath, or edema  Pulmonary  No cough or wheeze.  Gastro Intestinal  No nausea, vomitting, or diarrhoea. No bright red blood per rectum, no abdominal pain, change in bowel movement, or constipation.  Genito Urinary  No frequency, urgency, dysuria, no bleeding Musculo Skeletal  No myalgia, arthralgia, joint swelling or pain  Neurologic  No weakness, numbness, change in gait,  Psychology  No depression, anxiety, insomnia.   Vitals:  Blood pressure (!) 143/80, pulse 80, temperature 97.6 F (36.4 C), temperature source Tympanic, resp. rate 20, weight 245 lb 9.6 oz (111.4 kg), SpO2 98 %.  Physical Exam: WD in NAD Neck  Supple NROM, without  any enlargements.  Lymph Node Survey No cervical supraclavicular or inguinal adenopathy Cardiovascular  Pulse normal rate, regularity and rhythm. S1 and S2 normal.  Lungs  Clear to auscultation bilateraly, without wheezes/crackles/rhonchi. Good air movement.  Skin  No rash/lesions/breakdown  Psychiatry  Alert and oriented  to person, place, and time  Abdomen  Normoactive bowel sounds, abdomen soft, non-tender and obese, + pannus, without evidence of hernia. Incisions well healed. Back No CVA tenderness Genito Urinary  Vaginal cuff in tact and healing normally Rectal  deferred Extremities  No bilateral cyanosis, clubbing or edema.   30 minutes of direct face to face counseling time was spent with the patient. This included discussion about prognosis, therapy recommendations and postoperative side effects and are beyond the scope of routine postoperative care.   Thereasa Solo, MD  09/19/2020, 2:19 PM

## 2020-09-19 NOTE — Telephone Encounter (Signed)
Left a message with appointments for radiation oncology on 09/27/20 (10:30 nurse eval, 11:00 consult with Dr. Sondra Come).

## 2020-09-27 ENCOUNTER — Ambulatory Visit
Admission: RE | Admit: 2020-09-27 | Discharge: 2020-09-27 | Disposition: A | Discharge status: Home / Self Care | Payer: Medicaid Other | Source: Ambulatory Visit | Attending: Radiation Oncology | Admitting: Radiation Oncology

## 2020-09-27 ENCOUNTER — Encounter: Payer: Self-pay | Admitting: Radiation Oncology

## 2020-09-27 ENCOUNTER — Other Ambulatory Visit: Payer: Self-pay

## 2020-09-27 VITALS — BP 145/84 | HR 64 | Temp 97.5°F | Resp 20 | Ht 62.0 in | Wt 250.0 lb

## 2020-09-27 DIAGNOSIS — F419 Anxiety disorder, unspecified: Secondary | ICD-10-CM | POA: Insufficient documentation

## 2020-09-27 DIAGNOSIS — G2581 Restless legs syndrome: Secondary | ICD-10-CM | POA: Insufficient documentation

## 2020-09-27 DIAGNOSIS — Z801 Family history of malignant neoplasm of trachea, bronchus and lung: Secondary | ICD-10-CM | POA: Insufficient documentation

## 2020-09-27 DIAGNOSIS — I10 Essential (primary) hypertension: Secondary | ICD-10-CM | POA: Diagnosis not present

## 2020-09-27 DIAGNOSIS — Z79899 Other long term (current) drug therapy: Secondary | ICD-10-CM | POA: Insufficient documentation

## 2020-09-27 DIAGNOSIS — Z7982 Long term (current) use of aspirin: Secondary | ICD-10-CM | POA: Insufficient documentation

## 2020-09-27 DIAGNOSIS — C541 Malignant neoplasm of endometrium: Secondary | ICD-10-CM | POA: Diagnosis not present

## 2020-09-27 DIAGNOSIS — Z8673 Personal history of transient ischemic attack (TIA), and cerebral infarction without residual deficits: Secondary | ICD-10-CM | POA: Insufficient documentation

## 2020-09-27 DIAGNOSIS — K219 Gastro-esophageal reflux disease without esophagitis: Secondary | ICD-10-CM | POA: Insufficient documentation

## 2020-09-27 DIAGNOSIS — G4700 Insomnia, unspecified: Secondary | ICD-10-CM | POA: Diagnosis not present

## 2020-09-27 DIAGNOSIS — Z803 Family history of malignant neoplasm of breast: Secondary | ICD-10-CM | POA: Insufficient documentation

## 2020-09-27 DIAGNOSIS — J45909 Unspecified asthma, uncomplicated: Secondary | ICD-10-CM | POA: Diagnosis not present

## 2020-09-27 NOTE — Progress Notes (Signed)
Radiation Oncology         [phone] ________________________________  Initial Outpatient Consultation  Name: Sarah Bean MRN: [id number]  Date: 09/27/2020  DOB: 1966/05/19  BW:[id number], [facility]  Everitt Amber, MD   REFERRING PHYSICIAN: Everitt Amber, MD  DIAGNOSIS: The encounter diagnosis was Endometrial cancer [facility]).  StageIAgrade 2endometrioid endometrial adenocarcinoma with LVSI  HISTORY OF PRESENT ILLNESS::Sarah Bean is a 54 y.o. female who is seen as a courtesy of Dr. Denman George for an opinion concerning radiation therapy as part of management for her recently diagnosed endometrial cancer. Today, she is accompanied by a good friend. The patient presented to Dr. [name] on 06/22/2020 with evaluation of 10 year history of abnormal uterine bleeding. Work-up at that time included a transvaginal ultrasound that showed an enlarged uterus with multiple masses presumably fibroids. There was endometrial thickness of 19.1 mm with possible endometrial masses. An endometrial biopsy was performed on 07/17/2020 and revealed FIGO grade 1 endometrioid adenocarcinoma with squamous differentiation.  Given the above findings, the patient was referred to Dr. Denman George and underwent a robotic-assisted laparoscopic totally hysterectomy with bilateral salpingo-oophorectomy and minilaparotomy for specimen delivery. Pathology from the procedure revealed endometrioid adenocarcinoma with squamous differentiation, FIGO grade II, that spanned 5.2 cm. The tumor invaded less than one half of the myometrium. No there areas of malignancy were identified.  Focal LVSI was noted in the specimen.  Sentinel lymph node mapping could not be performed due to extreme obesity and a large bulky uterus which prevented visualization of the sidewall.  MMR normal/MSI stable.  The patient was last seen by Dr. Denman George on 09/19/2020, during which time it was recommended that she proceed with vaginal brachytherapy to  reduce risk for local recurrence.  PREVIOUS RADIATION THERAPY: No  PAST MEDICAL HISTORY:  Past Medical History:  Diagnosis Date  . Anemia   . Anxiety   . Asthma   . Depression   . Essential hypertension 05/26/2017  . GERD (gastroesophageal reflux disease)   . Insomnia   . Manic depression ([place])   . Obesity   . Pneumonia    history of  . Restless leg syndrome   . Stroke ([place])   . Uterine cancer ([place])     PAST SURGICAL HISTORY: Past Surgical History:  Procedure Laterality Date  . BREAST LUMPECTOMY Left   . CESAREAN SECTION     x3  . ROBOTIC ASSISTED TOTAL HYSTERECTOMY WITH BILATERAL SALPINGO OOPHERECTOMY N/A 08/29/2020   Procedure: XI ROBOTIC ASSISTED TOTAL HYSTERECTOMY WITH BILATERAL SALPINGO OOPHORECTOMY, GREATER THAN 250 GRAMS, MINI LAPAROTOMY;  [name], MD;  [name];  Service: Gynecology;  [name];  . TUBAL LIGATION Bilateral 1990    FAMILY HISTORY:  Family History  Problem Relation Age of Onset  . CAD Mother 51  . Seizures Mother   . Other Father 49       struck by lightning  . Seizures Brother   . Cancer Maternal Aunt        lung  . Cancer Maternal Grandmother        breast  . Cancer Paternal Grandmother        cervical  . Multiple sclerosis Daughter   . Autism Son   . Stroke Neg Hx     SOCIAL HISTORY:  Social History   Tobacco Use  . Smoking status: Never Smoker  . Smokeless tobacco: Never Used  Vaping Use  . Vaping Use: Never used  Substance Use Topics  .  Alcohol use: Not Currently    Comment: very seldom  . Drug use: Never    ALLERGIES: No Known Allergies  MEDICATIONS:  Current Outpatient Medications  Medication Sig Dispense Refill  . albuterol (VENTOLIN HFA) 108 (90 Base) MCG/ACT inhaler [name] 8 g 0  . aspirin EC 81 MG tablet Take 1 tablet (81 mg total) by mouth daily. Swallow whole. 30 tablet 11  . ibuprofen (ADVIL)  800 MG tablet [name] 30 tablet 0  . lisinopril-hydrochlorothiazide (ZESTORETIC) 20-25 MG tablet [name]    . QUEtiapine (SEROQUEL) 100 MG tablet Take 100 mg by mouth at bedtime.    . sertraline (ZOLOFT) 100 MG tablet [name]    . megestrol (MEGACE) 40 MG tablet Take 1 tablet (40 mg total) by mouth daily. Can increase to two tablets twice a day in the event of heavy bleeding (Patient taking differently: Take 40-80 mg by mouth See admin instructions. Can increase to 80 mg tablets twice a day in the event of heavy bleeding) 60 tablet 5  . polyethylene glycol (MIRALAX / GLYCOLAX) 17 g packet Take 17 g by mouth daily as needed.    . senna-docusate (SENOKOT-S) 8.6-50 MG tablet Take 2 tablets by mouth at bedtime. For AFTER surgery, do not take if having diarrhea 30 tablet 0  . traMADol (ULTRAM) 50 MG tablet Take 1 tablet (50 mg total) by mouth every 6 (six) hours as needed for severe pain. For AFTER surgery only, do not take and drive 10 tablet 0  . traZODone (DESYREL) 100 MG tablet [name]      No current facility-administered medications for this encounter.    REVIEW OF SYSTEMS:  A 10+ POINT REVIEW OF SYSTEMS WAS OBTAINED including neurology, dermatology, psychiatry, cardiac, respiratory, lymph, extremities, GI, GU, musculoskeletal, constitutional, reproductive, HEENT.  Patient has been reported some recent vaginal discharge and will speak with Dr. Denman George concerning this issue.   PHYSICAL EXAM:  height is 5' 2"  (1.575 m) and weight is 250 lb (113.4 kg). Her temporal temperature is 97.5 F (36.4 C) (abnormal). Her blood pressure is 145/84 (abnormal) and her pulse is 64. Her respiration is 20 and oxygen saturation is 99%.   General: Alert and oriented, in no acute distress HEENT: Head is normocephalic. Extraocular movements are intact.   Neck: Neck is supple, no palpable cervical or supraclavicular lymphadenopathy. Heart: Regular in rate and rhythm with no murmurs, rubs, or gallops. Chest: Clear to auscultation bilaterally, with no rhonchi, wheezes, or rales. Abdomen: Soft, nontender, nondistended, with no rigidity or guarding.  Mini lap scar healing well without signs of drainage or infection.  Laparoscopic scars also healing very well without signs of drainage or infection Extremities: No cyanosis or edema. Lymphatics: see Neck Exam Skin: No concerning lesions. Musculoskeletal: symmetric strength and muscle tone throughout. Neurologic: Cranial nerves II through XII are grossly intact. No obvious focalities. Speech is fluent. Coordination is intact. Psychiatric: Judgment and insight are intact. Affect is appropriate. Pelvic exam deferred in light of recent surgery.  Will be performed at the time of her vaginal brachytherapy planning and treatment  ECOG = 1  0 - Asymptomatic (Fully active, able to carry on all predisease activities without restriction)  1 - Symptomatic but completely ambulatory (Restricted in physically strenuous activity but ambulatory and able to carry out work of a light or sedentary nature. For example, light housework, office work)  2 - Symptomatic, <50% in bed during the day (Ambulatory and capable of all self care but unable to carry out any work activities. Up and about more than 50% of waking hours)  3 - Symptomatic, >50% in bed, but not bedbound (Capable of only limited self-care, confined to bed or chair 50% or more of waking hours)  4 - Bedbound (Completely disabled. Cannot carry on any self-care. Totally confined to bed or chair)  5 - Death   Eustace Pen MM, Creech RH, Tormey DC, et al. [phone]). "Toxicity and response criteria of the [facility]". [place] Oncol. 5 (6): 649-55  LABORATORY DATA:  Lab Results  Component Value Date   WBC 10.6 (H) 08/21/2020   HGB 10.6  (L) 08/21/2020   HCT 35.4 (L) 08/21/2020   MCV 69.5 (L) 08/21/2020   PLT 382 08/21/2020   NEUTROABS 6.7 07/28/2020   Lab Results  Component Value Date   NA 139 08/21/2020   K 4.1 08/21/2020   CL 106 08/21/2020   CO2 21 (L) 08/21/2020   GLUCOSE 96 08/21/2020   CREATININE 1.06 (H) 08/21/2020   CALCIUM 10.6 (H) 08/21/2020      RADIOGRAPHY: No results found.    IMPRESSION: StageIAgrade 2endometrioid endometrial adenocarcinoma with LVSI  Given the pathologic findings the patient would be at risk for vaginal cuff recurrence and would agree with Dr. Serita Grit recommendations for vaginal brachytherapy.  Since the patient did not have nodal assessment due to morbid obesity one could make a case for combining brachytherapy with external beam to cover pelvic nodal regions however given the patient's extreme morbid obesity would not recommend this as part of her adjuvant treatment.  If she were to fail in the pelvis at a later date she would be a candidate for external beam radiation therapy.  Today, I talked to the patient and friend about the findings and work-up thus far.  We discussed the natural history of endometrial cancer and general treatment, highlighting the role of radiotherapy (vaginal brachytherapy) in the management.  We discussed the available radiation techniques, and focused on the details of logistics and delivery.  We reviewed the anticipated acute and late sequelae associated with radiation in this setting.  The patient was encouraged to ask questions that I answered to the best of my ability.  A patient consent form was discussed and signed.  We retained a copy for our records.  The patient would like to proceed with radiation and will be scheduled for CT simulation.  PLAN: Patient will be scheduled for vaginal brachytherapy to start approximately 6 to 7 weeks postop.  She will receive 5 high-dose-rate treatments directed at the vaginal cuff using iridium 192 as the  high-dose-rate source.  Total time spent in this encounter was 60 minutes which included reviewing the patient's most recent transvaginal ultrasound, endomterial biopsy, hysterectomy, pathology reports, consultations, follow-ups, physical examination, and documentation.  ------------------------------------------------  Blair Promise, PhD, MD  This document serves as a record of services personally performed by Gery Pray, MD. It was created on his behalf by Clerance Lav, a trained medical scribe. The creation of this record is based on the scribe's personal observations and the provider's statements to them. This document has been checked and approved by the attending provider.

## 2020-09-27 NOTE — Progress Notes (Signed)
GYN Location of Tumor / Histology:  Stage IA grade 2 endometrioid endometrial adenocarcinoma with LVSI  Sarah Bean Scarberry's symptoms: began approximately 10 years prior to diagnosis with abnormal uterine bleeding.  [name]  When her symptoms escalated with respect to the volume of bleeding, she sought evaluation with Dr. Elly Modena.  She saw Dr. Elly Modena for abnormal uterine bleeding on 06/22/2020. Work-up of symptoms included a transvaginal ultrasound scan and endometrial biopsy.Transvaginal US on June 22, 2020 showed a uterus measuring 14.9 x 12.3 x 10.5 cm.  There were multiple fibroids within the uterus including a partially exophytic posterior uterine wall fibroid measuring 8 cm. The endometrium was thick at 19 mm.  The left and right ovaries were not seen. 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   Biopsies revealed:  08/29/2020 FINAL MICROSCOPIC DIAGNOSIS:  A. UTERUS, CERVIX, BILATERAL FALLOPIAN TUBES AND OVARIES:  - Uterus:    Endomyometrium: Endometrioid adenocarcinoma with squamous differentiation, FIGO grade II, spanning 5.2 cm.      Tumor invades less than one half of the myometrium.       Lymphovascular invasion present.       Polypoid inactive endometrium with hormone effect.       Adenomyosis. Leiomyomata.       See oncology table.    Serosa: Unremarkable. No malignancy.  - Cervix: Benign squamous and endocervical mucosa. No dysplasia or malignancy.  - Bilateral ovaries: Unremarkable. No malignancy.  - Bilateral fallopian tubes: Mixed inflammation. No malignancy.   Past/Anticipated interventions by Gyn/Onc surgery, if any:  08/29/2020 Dr. Everitt Amber Robotic-assisted laparoscopic total hysterectomy >250gm,  with bilateral  salpingoophorectomy  Past/Anticipated interventions by medical oncology, if any:  No referral placed at this time  Weight changes, if any: Patient denies  Bowel/Bladder complaints, if any: Patient denies any urinary urgency, frequency, hematuria, or dysuria. Reports she has dealt with constipation since her GYN surgery. She is trying to manage with Miralax and stool softners (last BM was 09/26/20)  Nausea/Vomiting, if any: Ever since her surgery she reports intermittent nausea after she eats. Denies any vomiting, and reports nausea seems to be random   Pain issues, if any:  Patient denies  SAFETY ISSUES:  Prior radiation? No  Pacemaker/ICD?  No  Possible current pregnancy? No--hysterectomy 08/29/2020  Is the patient on methotrexate? No  Current Complaints / other details:  Reports increase in vaginal discharge with a fishy/foul odor (denies any bloody discharge)

## 2020-09-28 ENCOUNTER — Encounter: Payer: Self-pay | Admitting: *Deleted

## 2020-09-28 NOTE — Progress Notes (Signed)
[facility] Psychosocial Distress Screening Clinical Social Work  Clinical Social Work was referred by distress screening protocol.  The patient scored a 10 on the Psychosocial Distress Thermometer which indicates severe distress. Clinical Social Worker contacted patient by phone to assess for distress and other psychosocial needs.   [name] shared she has agreed upon plan to proceed with radiation therapy and felt all questions were answered at her MD visit.  The patient reported her distress stems not only from cancer diagnosis but financial concerns and other "life factors".  Patient has no concerns to share with CSW at this time.  [name] lives alone, but is currently residing with her daughter after surgery.    Patient is on disability and did not specify specific financial needs, but finances were a concern in general. CSW encouraged patient to meet with patient resource specialists after [facility] planning appointment to determine if she qualifies for [employer].  ONCBCN DISTRESS SCREENING 09/27/2020  Screening Type Initial Screening  Distress experienced in past week (1-10) 10  Family Problem type Partner;Children  Emotional problem type Depression;Nervousness/Anxiety  Physical Problem type Nausea/vomiting;Sleep/insomnia  Physician notified of physical symptoms Yes  Referral to clinical psychology No  Referral to clinical social work Yes  Referral to dietition No  Referral to financial advocate No  Referral to support programs No  Referral to palliative care No    Clinical Social Worker follow up needed: No.  If yes, follow up plan:  Sarah Maine, LCSW

## 2020-10-10 ENCOUNTER — Telehealth: Payer: Self-pay | Admitting: *Deleted

## 2020-10-10 NOTE — Telephone Encounter (Signed)
CALLED PATIENT TO INFORM OF NEW HDR [facility], SPOKE WITH PATIENT AND SHE IS AWARE OF THESE APPTS. °

## 2020-10-17 ENCOUNTER — Telehealth: Payer: Self-pay | Admitting: *Deleted

## 2020-10-17 NOTE — Progress Notes (Signed)
Radiation Oncology         [phone] ________________________________  Name: Sarah HedgesSharon D Hendriks MRN: [id number]  Date: 10/18/2020  DOB: 10/19/1966  Vaginal Brachytherapy Procedure Note  CC: Medicine, [facility] Adolphus Birchwoodossi, Emma, MD    ICD-10-CM   1. 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 She was seen in consultation on 09/27/2020, during which time we discussed proceeding with vaginal brachytherapy given the pathologic findings and risk for vaginal cuff recurrence.  On review of systems, she reports no complaints. She denies any pelvic pain vaginal discharge or bleeding.  She denies any urinary symptoms or bowel complaints.  ALLERGIES: has No Known Allergies.  Meds: Current Outpatient Medications  Medication Sig Dispense Refill  . albuterol (VENTOLIN HFA) 108 (90 Base) MCG/ACT inhaler [name] 8 g 0  . aspirin EC 81 MG tablet Take 1 tablet (81 mg total) by mouth daily. Swallow whole. 30 tablet 11  . ibuprofen (ADVIL) 800 MG tablet [name] 30 tablet 0  . lisinopril-hydrochlorothiazide (ZESTORETIC) 20-25 MG tablet [name]    . megestrol (MEGACE) 40 MG tablet Take 1 tablet (40 mg total) by mouth daily. Can increase to two tablets twice a day in the event of heavy bleeding (Patient taking differently: Take 40-80 mg by mouth See admin instructions. Can increase to 80 mg tablets twice a day in the event of heavy bleeding) 60 tablet 5  . polyethylene glycol (MIRALAX / GLYCOLAX) 17 g packet Take 17 g by mouth daily as needed.    Marland Kitchen. QUEtiapine  (SEROQUEL) 100 MG tablet Take 100 mg by mouth at bedtime.    . senna-docusate (SENOKOT-S) 8.6-50 MG tablet Take 2 tablets by mouth at bedtime. For AFTER surgery, do not take if having diarrhea 30 tablet 0  . sertraline (ZOLOFT) 100 MG tablet [name]    . traMADol (ULTRAM) 50 MG tablet Take 1 tablet (50 mg total) by mouth every 6 (six) hours as needed for severe pain. For AFTER surgery only, do not take and drive 10 tablet 0  . traZODone (DESYREL) 100 MG tablet [name]      No current facility-administered medications for this encounter.    Physical Findings: The patient is in no acute distress. Patient is alert and oriented.  height is 5\' 2"  (1.575 m) and weight is 254 lb 6 oz (115.4 kg). Her temporal temperature is 97.1 F (36.2 C) (abnormal). Her blood pressure is 159/95 (abnormal) and her pulse is 88. Her respiration is 18 and oxygen saturation is 95%.   No palpable cervical, supraclavicular or axillary lymphoadenopathy. The heart has a regular rate and rhythm. The lungs are clear to auscultation. Abdomen soft and non-tender.  On pelvic examination the external genitalia were unremarkable. A speculum exam was performed. Vaginal cuff intact, no mucosal lesions. On bimanual exam there were no pelvic masses appreciated.  Lab Findings: Lab Results  Component Value Date   WBC 10.6 (H) 08/21/2020   HGB 10.6 (L) 08/21/2020   HCT 35.4 (L) 08/21/2020   MCV 69.5 (L) 08/21/2020  PLT 382 08/21/2020    Radiographic Findings: No results found.  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 The patient will be treated with a 3.0 cm diameter cylinder with a treatment length of 3.5 cm. This distended the vaginal vault without undue discomfort. The patient tolerated the procedure well.  The patient was successfully fitted for a vaginal cylinder. The patient is appropriate to  begin vaginal brachytherapy.   Plan: The patient will proceed with CT simulation and vaginal brachytherapy today.  Anticipate 5 high-dose-rate treatments.  _______________________________   Billie Lade, PhD, MD  This document serves as a record of services personally performed by Antony Blackbird, MD. It was created on his behalf by Nikki Dom, a trained medical scribe. The creation of this record is based on the scribe's personal observations and the provider's statements to them. This document has been checked and approved by the attending provider.

## 2020-10-17 NOTE — Telephone Encounter (Signed)
CALLED PATIENT TO REMIND OF NEW HDR [facility] FOR 10-18-20 - ARRIVAL TIME- 7:45 AM @ [facility], SPOKE WITH PATIENT AND SHE IS AWARE OF THESE APPTS.

## 2020-10-17 NOTE — Progress Notes (Signed)
  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 She will be treated with a 3.0 cm diameter segmented cylinder. This conforms to her anatomy without undue discomfort.  Vaginal brachytherapy procedure node: The patient was brought to the HDR suite. Identity was confirmed. All relevant records and images related to the planned course of therapy were reviewed. The patient freely provided informed written consent to proceed with treatment after reviewing the details related to the planned course of therapy. The consent form was witnessed and verified by the simulation staff. Then, the patient was set-up in a stable reproducible supine position for radiation therapy. Pelvic exam revealed the vaginal cuff to be intact . The patient's custom vaginal cylinder was placed in the proximal vagina. This was affixed to the CT/MR stabilization plate to prevent slippage. Patient tolerated the placement well.  Verification simulation note:  A fiducial marker was placed within the vaginal cylinder. An AP and lateral film was then obtained through the pelvis area. This documented accurate position of the vaginal cylinder for treatment.  HDR BRACHYTHERAPY TREATMENT  The remote afterloading device was affixed to the vaginal cylinder by catheter. Patient then proceeded to undergo her first high-dose-rate treatment directed at the proximal vagina. The patient was prescribed a dose of 6.0 gray to be delivered to the mucosal surface. Treatment length was 3.5 cm. Patient was treated with 1 channel using 8 dwell positions. Treatment time was 396.9 seconds. Iridium 192 was the  high-dose-rate source for treatment. The patient tolerated the treatment well. After completion of her therapy, a radiation survey was performed documenting return of the iridium source into the GammaMed safe.   PLAN: The patient will return next week for her second high-dose-rate treatment. ________________________________    Billie Lade, PhD, MD  This document serves as a record of services personally performed by Antony Blackbird, MD. It was created on his behalf by Nikki Dom, a trained medical scribe. The creation of this record is based on the scribe's personal observations and the provider's statements to them. This document has been checked and approved by the attending provider.

## 2020-10-18 ENCOUNTER — Other Ambulatory Visit: Payer: Self-pay

## 2020-10-18 ENCOUNTER — Ambulatory Visit
Admission: RE | Admit: 2020-10-18 | Discharge: 2020-10-18 | Disposition: A | Discharge status: Home / Self Care | Payer: Medicaid Other | Source: Ambulatory Visit | Attending: Radiation Oncology | Admitting: Radiation Oncology

## 2020-10-18 ENCOUNTER — Encounter: Payer: Self-pay | Admitting: Radiation Oncology

## 2020-10-18 DIAGNOSIS — C541 Malignant neoplasm of endometrium: Secondary | ICD-10-CM

## 2020-10-18 DIAGNOSIS — Z7982 Long term (current) use of aspirin: Secondary | ICD-10-CM | POA: Diagnosis not present

## 2020-10-18 DIAGNOSIS — C7982 Secondary malignant neoplasm of genital organs: Secondary | ICD-10-CM | POA: Diagnosis present

## 2020-10-18 DIAGNOSIS — Z79899 Other long term (current) drug therapy: Secondary | ICD-10-CM | POA: Insufficient documentation

## 2020-10-18 DIAGNOSIS — Z510 Encounter for antineoplastic radiation therapy: Secondary | ICD-10-CM | POA: Diagnosis not present

## 2020-10-18 NOTE — Progress Notes (Signed)
Patient here for a HDR [facility] with Dr. Roselind Messier. Patient denies any problems at present.  BP (!) 159/95 (BP Location: Left Arm, Patient Position: Sitting)   Pulse 88   Temp (!) 97.1 F (36.2 C) (Temporal)   Resp 18   Ht 5\' 2"  (1.575 m)   Wt 254 lb 6 oz (115.4 kg)   SpO2 95%   BMI 46.53 kg/m   Wt Readings from Last 3 Encounters:  10/18/20 254 lb 6 oz (115.4 kg)  09/27/20 250 lb (113.4 kg)  09/19/20 245 lb 9.6 oz (111.4 kg)

## 2020-10-24 ENCOUNTER — Telehealth: Payer: Self-pay | Admitting: Radiation Oncology

## 2020-10-24 NOTE — Telephone Encounter (Signed)
Confirmed tx appt for patient. Patient inquired about a grant after speaking to a Child psychotherapist regarding her finances. I've let our financial representative know that pt would like more information on grant. They will be calling patient.

## 2020-10-25 ENCOUNTER — Telehealth: Payer: Self-pay | Admitting: *Deleted

## 2020-10-25 NOTE — Telephone Encounter (Signed)
CALLED PATIENT TO REMIND OF HDR TX. FOR 10-26-20 @ 1 PM, SPOKE WITH PATIENT AND SHE IS AWARE OF THIS TX.

## 2020-10-26 ENCOUNTER — Ambulatory Visit
Admission: RE | Admit: 2020-10-26 | Discharge: 2020-10-26 | Disposition: A | Discharge status: Home / Self Care | Payer: Medicaid Other | Source: Ambulatory Visit | Attending: Radiation Oncology | Admitting: Radiation Oncology

## 2020-10-26 ENCOUNTER — Other Ambulatory Visit: Payer: Self-pay

## 2020-10-26 DIAGNOSIS — C541 Malignant neoplasm of endometrium: Secondary | ICD-10-CM

## 2020-10-26 NOTE — Progress Notes (Signed)
  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 She will be treated with a 3.0 cm diameter segmented cylinder. This conforms to her anatomy without undue discomfort.  Vaginal brachytherapy procedure node: The patient was brought to the HDR suite. Identity was confirmed. All relevant records and images related to the planned course of therapy were reviewed. The patient freely provided informed written consent to proceed with treatment after reviewing the details related to the planned course of therapy. The consent form was witnessed and verified by the simulation staff. Then, the patient was set-up in a stable reproducible supine position for radiation therapy. Pelvic exam revealed the vaginal cuff to be intact . The patient's custom vaginal cylinder was placed in the proximal vagina. This was affixed to the CT/MR stabilization plate to prevent slippage. Patient tolerated the placement well.  Verification simulation note:  A fiducial marker was placed within the vaginal cylinder. An AP and lateral film was then obtained through the pelvis area. This documented accurate position of the vaginal cylinder for treatment.  HDR BRACHYTHERAPY TREATMENT  The remote afterloading device was affixed to the vaginal cylinder by catheter. Patient then proceeded to undergo her second high-dose-rate treatment directed at the proximal vagina. The patient was prescribed a dose of 6.0 gray to be delivered to the mucosal surface. Treatment length was 3.5 cm. Patient was treated with 1 channel using 8 dwell positions. Treatment time was 427.7 seconds. Iridium 192 was the  high-dose-rate source for treatment. The patient tolerated the treatment well. After completion of her therapy, a radiation survey was performed documenting return of the iridium source into the GammaMed safe.   PLAN: The patient will return next week for her third high-dose-rate treatment. ________________________________    Billie Lade, PhD, MD  This document serves as a record of services personally performed by Antony Blackbird, MD. It was created on his behalf by Nikki Dom, a trained medical scribe. The creation of this record is based on the scribe's personal observations and the provider's statements to them. This document has been checked and approved by the attending provider.

## 2020-10-31 ENCOUNTER — Telehealth: Payer: Self-pay | Admitting: *Deleted

## 2020-10-31 NOTE — Telephone Encounter (Signed)
CALLED PATIENT TO REMIND OF 10 AM TX. ON 11-01-20, SPOKE WITH PATIENT AND SHE IS AWARE OF THIS [place].

## 2020-11-01 ENCOUNTER — Other Ambulatory Visit: Payer: Self-pay

## 2020-11-01 ENCOUNTER — Encounter: Payer: Self-pay | Admitting: Radiation Oncology

## 2020-11-01 ENCOUNTER — Ambulatory Visit
Admission: RE | Admit: 2020-11-01 | Discharge: 2020-11-01 | Disposition: A | Discharge status: Home / Self Care | Payer: Medicaid Other | Source: Ambulatory Visit | Attending: Radiation Oncology | Admitting: Radiation Oncology

## 2020-11-01 DIAGNOSIS — C541 Malignant neoplasm of endometrium: Secondary | ICD-10-CM

## 2020-11-01 NOTE — Progress Notes (Signed)
  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 She will be treated with a 3.0 cm diameter segmented cylinder. This conforms to her anatomy without undue discomfort.  Vaginal brachytherapy procedure node: The patient was brought to the [place] suite. Identity was confirmed. All relevant records and images related to the planned course of therapy were reviewed. The patient freely provided informed written consent to proceed with treatment after reviewing the details related to the planned course of therapy. The consent form was witnessed and verified by the simulation staff. Then, the patient was set-up in a stable reproducible supine position for radiation therapy. Pelvic exam revealed the vaginal cuff to be intact . The patient's custom vaginal cylinder was placed in the proximal vagina. This was affixed to the CT/MR stabilization plate to prevent slippage. Patient tolerated the placement well.  Verification simulation note:  A fiducial marker was placed within the vaginal cylinder. An AP and lateral film was then obtained through the pelvis area. This documented accurate position of the vaginal cylinder for treatment.  HDR BRACHYTHERAPY TREATMENT  The remote afterloading device was affixed to the vaginal cylinder by catheter. Patient then proceeded to undergo her third high-dose-rate treatment directed at the proximal vagina. The patient was prescribed a dose of 6.0 gray to be delivered to the mucosal surface. Treatment length was 3.5 cm. Patient was treated with 1 channel using 8 dwell positions. Treatment time was 452.3 seconds. Iridium 192 was the  high-dose-rate source for treatment. The patient tolerated the treatment well. After completion of her therapy, a radiation survey was performed documenting return of the iridium source into the GammaMed safe.   PLAN: The patient will return next week for her fourth high-dose-rate treatment. ________________________________    Blair Promise, PhD, MD  This document serves as a record of services personally performed by Gery Pray, MD. It was created on his behalf by Clerance Lav, a trained medical scribe. The creation of this record is based on the scribe's personal observations and the provider's statements to them. This document has been checked and approved by the attending provider.

## 2020-11-01 NOTE — Progress Notes (Signed)
Called pt and informed her of the information needed to get her signed up for the [employer].  Pt verbalized understanding and plans to bring in info.

## 2020-11-07 ENCOUNTER — Telehealth: Payer: Self-pay | Admitting: *Deleted

## 2020-11-07 NOTE — Telephone Encounter (Signed)
Called patient to remind of HDR Tx. For 11-08-20 @ 2 pm, spoke with patient and she is aware of this tx.

## 2020-11-07 NOTE — Telephone Encounter (Signed)
Called the patient and left a message to call the office or stop tomorrow after radiation to sign a form

## 2020-11-08 ENCOUNTER — Ambulatory Visit
Admission: RE | Admit: 2020-11-08 | Discharge: 2020-11-08 | Disposition: A | Discharge status: Home / Self Care | Payer: Medicaid Other | Source: Ambulatory Visit | Attending: Radiation Oncology | Admitting: Radiation Oncology

## 2020-11-08 ENCOUNTER — Other Ambulatory Visit: Payer: Self-pay

## 2020-11-08 DIAGNOSIS — C541 Malignant neoplasm of endometrium: Secondary | ICD-10-CM

## 2020-11-08 NOTE — Progress Notes (Signed)
  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 She will be treated with a 3.0 cm diameter segmented cylinder. This conforms to her anatomy without undue discomfort.  Vaginal brachytherapy procedure node: The patient was brought to the [place] suite. Identity was confirmed. All relevant records and images related to the planned course of therapy were reviewed. The patient freely provided informed written consent to proceed with treatment after reviewing the details related to the planned course of therapy. The consent form was witnessed and verified by the simulation staff. Then, the patient was set-up in a stable reproducible supine position for radiation therapy. Pelvic exam revealed the vaginal cuff to be intact . The patient's custom vaginal cylinder was placed in the proximal vagina. This was affixed to the CT/MR stabilization plate to prevent slippage. Patient tolerated the placement well.  Verification simulation note:  A fiducial marker was placed within the vaginal cylinder. An AP and lateral film was then obtained through the pelvis area. This documented accurate position of the vaginal cylinder for treatment.  HDR BRACHYTHERAPY TREATMENT  The remote afterloading device was affixed to the vaginal cylinder by catheter. Patient then proceeded to undergo her fourth high-dose-rate treatment directed at the proximal vagina. The patient was prescribed a dose of 6.0 gray to be delivered to the mucosal surface. Treatment length was 3.5 cm. Patient was treated with 1 channel using 8 dwell positions. Treatment time was 206.9 seconds. Iridium 192 was the  high-dose-rate source for treatment. The patient tolerated the treatment well. After completion of her therapy, a radiation survey was performed documenting return of the iridium source into the GammaMed safe.   PLAN: The patient will return next week for her fifth and final high-dose-rate treatment. ________________________________    Blair Promise, PhD, MD  This document serves as a record of services personally performed by Gery Pray, MD. It was created on his behalf by Clerance Lav, a trained medical scribe. The creation of this record is based on the scribe's personal observations and the provider's statements to them. This document has been checked and approved by the attending provider.

## 2020-11-15 ENCOUNTER — Telehealth: Payer: Self-pay | Admitting: *Deleted

## 2020-11-15 NOTE — Progress Notes (Incomplete)
  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 She will be treated with a 3.0 cm diameter segmented cylinder. This conforms to her anatomy without undue discomfort.  Vaginal brachytherapy procedure node: The patient was brought to the [place] suite. Identity was confirmed. All relevant records and images related to the planned course of therapy were reviewed. The patient freely provided informed written consent to proceed with treatment after reviewing the details related to the planned course of therapy. The consent form was witnessed and verified by the simulation staff. Then, the patient was set-up in a stable reproducible supine position for radiation therapy. Pelvic exam revealed the vaginal cuff to be intact . The patient's custom vaginal cylinder was placed in the proximal vagina. This was affixed to the CT/MR stabilization plate to prevent slippage. Patient tolerated the placement well.  Verification simulation note:  A fiducial marker was placed within the vaginal cylinder. An AP and lateral film was then obtained through the pelvis area. This documented accurate position of the vaginal cylinder for treatment.  HDR BRACHYTHERAPY TREATMENT  The remote afterloading device was affixed to the vaginal cylinder by catheter. Patient then proceeded to undergo her fifth high-dose-rate treatment directed at the proximal vagina. The patient was prescribed a dose of 6.0 gray to be delivered to the mucosal surface. Treatment length was 3.5 cm. Patient was treated with 1 channel using 8 dwell positions. Treatment time was *** seconds. Iridium 192 was the  high-dose-rate source for treatment. The patient tolerated the treatment well. After completion of her therapy, a radiation survey was performed documenting return of the iridium source into the GammaMed safe.   PLAN: The patient will return for routine follow-up in one month. ________________________________    Blair Promise, PhD, MD  This document serves as a record of services personally performed by Gery Pray, MD. It was created on his behalf by Clerance Lav, a trained medical scribe. The creation of this record is based on the scribe's personal observations and the provider's statements to them. This document has been checked and approved by the attending provider.

## 2020-11-15 NOTE — Telephone Encounter (Signed)
Returned patient's phone call, spoke with patient 

## 2020-11-16 ENCOUNTER — Ambulatory Visit
Admission: RE | Admit: 2020-11-16 | Discharge: 2020-11-16 | Disposition: A | Discharge status: Home / Self Care | Payer: Medicaid Other | Source: Ambulatory Visit | Attending: Radiation Oncology | Admitting: Radiation Oncology

## 2020-11-21 ENCOUNTER — Telehealth: Payer: Self-pay | Admitting: *Deleted

## 2020-11-21 NOTE — Telephone Encounter (Signed)
Called patient to remind of [place] for 11-22-20 @ 3 pm, spoke with patient and she is aware of this tx.

## 2020-11-22 ENCOUNTER — Encounter: Payer: Self-pay | Admitting: Radiation Oncology

## 2020-11-22 ENCOUNTER — Other Ambulatory Visit: Payer: Self-pay

## 2020-11-22 ENCOUNTER — Ambulatory Visit
Admission: RE | Admit: 2020-11-22 | Discharge: 2020-11-22 | Disposition: A | Discharge status: Home / Self Care | Payer: Medicaid Other | Source: Ambulatory Visit | Attending: Radiation Oncology | Admitting: Radiation Oncology

## 2020-11-22 DIAGNOSIS — C541 Malignant neoplasm of endometrium: Secondary | ICD-10-CM

## 2020-11-22 NOTE — Progress Notes (Signed)
  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 She will be treated with a 3.0 cm diameter segmented cylinder. This conforms to her anatomy without undue discomfort.  Vaginal brachytherapy procedure node: The patient was brought to the [place] suite. Identity was confirmed. All relevant records and images related to the planned course of therapy were reviewed. The patient freely provided informed written consent to proceed with treatment after reviewing the details related to the planned course of therapy. The consent form was witnessed and verified by the simulation staff. Then, the patient was set-up in a stable reproducible supine position for radiation therapy. Pelvic exam revealed the vaginal cuff to be intact . The patient's custom vaginal cylinder was placed in the proximal vagina. This was affixed to the CT/MR stabilization plate to prevent slippage. Patient tolerated the placement well.  Verification simulation note:  A fiducial marker was placed within the vaginal cylinder. An AP and lateral film was then obtained through the pelvis area. This documented accurate position of the vaginal cylinder for treatment.  HDR BRACHYTHERAPY TREATMENT  The remote afterloading device was affixed to the vaginal cylinder by catheter. Patient then proceeded to undergo her fifth high-dose-rate treatment directed at the proximal vagina. The patient was prescribed a dose of 6.0 gray to be delivered to the mucosal surface. Treatment length was 3.5 cm. Patient was treated with 1 channel using 8 dwell positions. Treatment time was 236.2 seconds. Iridium 192 was the  high-dose-rate source for treatment. The patient tolerated the treatment well. After completion of her therapy, a radiation survey was performed documenting return of the iridium source into the [employer] safe.   PLAN: The patient will return for routine follow-up in one month. ________________________________    Blair Promise, PhD, MD  This document serves as a record of services personally performed by Gery Pray, MD. It was created on his behalf by Clerance Lav, a trained medical scribe. The creation of this record is based on the scribe's personal observations and the provider's statements to them. This document has been checked and approved by the attending provider.

## 2020-12-05 ENCOUNTER — Ambulatory Visit: Payer: Medicaid Other | Admitting: Adult Health

## 2020-12-14 ENCOUNTER — Encounter: Payer: Self-pay | Admitting: *Deleted

## 2020-12-21 ENCOUNTER — Ambulatory Visit
Admission: RE | Admit: 2020-12-21 | Discharge: 2020-12-21 | Disposition: A | Discharge status: Home / Self Care | Payer: Medicaid Other | Source: Ambulatory Visit | Attending: Radiation Oncology | Admitting: Radiation Oncology

## 2020-12-21 ENCOUNTER — Encounter: Payer: Self-pay | Admitting: Radiation Oncology

## 2020-12-21 ENCOUNTER — Other Ambulatory Visit: Payer: Self-pay

## 2020-12-21 VITALS — BP 143/100 | HR 74 | Temp 97.1°F | Resp 18 | Ht 62.0 in | Wt 257.1 lb

## 2020-12-21 DIAGNOSIS — K5900 Constipation, unspecified: Secondary | ICD-10-CM | POA: Insufficient documentation

## 2020-12-21 DIAGNOSIS — Z7982 Long term (current) use of aspirin: Secondary | ICD-10-CM | POA: Diagnosis not present

## 2020-12-21 DIAGNOSIS — C541 Malignant neoplasm of endometrium: Secondary | ICD-10-CM | POA: Insufficient documentation

## 2020-12-21 DIAGNOSIS — Z923 Personal history of irradiation: Secondary | ICD-10-CM | POA: Diagnosis not present

## 2020-12-21 DIAGNOSIS — Z79899 Other long term (current) drug therapy: Secondary | ICD-10-CM | POA: Insufficient documentation

## 2020-12-21 NOTE — Progress Notes (Signed)
Patient in for one month follow up . Dilator education complete with written instructions. She denies any vaginal discharge or bleeding.  BP (!) 143/100 (BP Location: Left Arm, Patient Position: Sitting)   Pulse 74   Temp (!) 97.1 F (36.2 C) (Temporal)   Resp 18   Ht 5\' 2"  (1.575 m)   Wt 257 lb 2 oz (116.6 kg)   SpO2 98%   BMI 47.03 kg/m  She did not take her blood pressure medication today.

## 2020-12-21 NOTE — Progress Notes (Signed)
Radiation Oncology         [phone] ________________________________  Name: Sarah Bean MRN: [id number]  Date: 12/21/2020  DOB: 16-Sep-1966  Follow-Up Visit Note  CC: [facility]  [facility]    ICD-10-CM   1. 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 No significant interval history since the end of treatment.  On review of systems, she reports overall feeling well. She denies pelvic pain vaginal bleeding or hematuria. She reports 1 isolated episode of rectal bleeding associated with constipation and straining. She denies any abdominal bloating.                            ALLERGIES:  has No Known Allergies.  Meds: Current Outpatient Medications  Medication Sig Dispense Refill  . albuterol (VENTOLIN HFA) 108 (90 Base) MCG/ACT inhaler [name] 8 g 0  . ibuprofen (ADVIL) 800 MG tablet [name] (Patient taking differently: Take 200 mg by mouth every 8 (eight) hours as needed for moderate pain.) 30 tablet 0  . lisinopril-hydrochlorothiazide (ZESTORETIC) 20-25 MG tablet [name]    . polyethylene glycol (MIRALAX / GLYCOLAX) 17 g packet Take 17 g by mouth daily as needed.    Marland Kitchen QUEtiapine (SEROQUEL) 100 MG tablet Take 100 mg by mouth at bedtime.    . sertraline (ZOLOFT) 100 MG tablet [name]    . traZODone (DESYREL) 100 MG tablet Take 200 mg by  mouth at bedtime.     Marland Kitchen aspirin EC 81 MG tablet Take 1 tablet (81 mg total) by mouth daily. Swallow whole. (Patient not taking: Reported on 12/21/2020) 30 tablet 11   No current facility-administered medications for this encounter.    Physical Findings: The patient is in no acute distress. Patient is alert and oriented.  height is 5\' 2"  (1.575 m) and weight is 257 lb 2 oz (116.6 kg). Her temporal temperature is 97.1 F (36.2 C) (abnormal). Her blood pressure is 143/100 (abnormal) and her pulse is 74. Her respiration is 18 and oxygen saturation is 98%.  No significant changes. Lungs are clear to auscultation bilaterally. Heart has regular rate and rhythm. No palpable cervical, supraclavicular, or axillary adenopathy. Abdomen soft, non-tender, normal bowel sounds. Pelvic exam deferred in light of recent radiation therapy.   Lab Findings: Lab Results  Component Value Date   WBC 10.6 (H) 08/21/2020   HGB 10.6 (L) 08/21/2020   HCT 35.4 (L) 08/21/2020   MCV 69.5 (L) 08/21/2020   PLT 382 08/21/2020    Radiographic Findings: No results found.  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  Plan: The patient will follow up with Dr. Denman George in 2 months and with radiation oncology in 5 months. Today the patient was given a vaginal dilator and instructions on its  use.  Total time spent in this encounter was 15 minutes which included reviewing the patient's most recent interval history, physical examination, and documentation.  ____________________________________   Blair Promise, PhD, MD  This document serves as a record of services personally performed by Gery Pray, MD. It was created on his behalf by Clerance Lav, a trained medical scribe. The creation of this record is based on the scribe's personal observations and the provider's statements to them. This  document has been checked and approved by the attending provider.

## 2020-12-21 NOTE — Progress Notes (Incomplete)
  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 She did not report any significant radiation-related side effects.  Plan: The patient will follow-up with radiation oncology in one month.  ________________________________________________   Blair Promise, PhD, MD  This document serves as a record of services personally performed by Gery Pray, MD. It was created on his behalf by Clerance Lav, a trained medical scribe. The creation of this record is based on the scribe's personal observations and the provider's statements to them. This document has been checked and approved by the attending provider.

## 2020-12-25 ENCOUNTER — Telehealth: Payer: Self-pay

## 2020-12-25 NOTE — Telephone Encounter (Signed)
Sarah Bean states that she noticed some blood in the commode on Saturday x 2.   She has not begun using the vaginal dilator given at appointment 12-21-20 with Dr. Sondra Come.   Sarah Cappello is  Afebrile. She states that she has urinary burning, frequency, and foul odor.  She cannot come to the office today to give a urine specimen. She has an appointment tomorrow for lab work with [facility].  Told her to call the office and speak with PCP and discuss the symptoms noted above and they maybe able to do a urinalysis and culture tomorrow with her labs. Told her if the blood increases and urine negative for infection to call Dr. Serita Grit office as she may need to be seen to evaluate her for recurrence. Sarah Busenbark verbalized understanding.

## 2021-01-21 ENCOUNTER — Other Ambulatory Visit: Payer: Self-pay

## 2021-01-21 ENCOUNTER — Encounter (HOSPITAL_COMMUNITY): Payer: Self-pay

## 2021-01-21 ENCOUNTER — Emergency Department (HOSPITAL_COMMUNITY): Payer: Medicaid Other

## 2021-01-21 ENCOUNTER — Emergency Department (HOSPITAL_COMMUNITY)
Admission: EM | Admit: 2021-01-21 | Discharge: 2021-01-21 | Disposition: A | Discharge status: Home / Self Care | Payer: Medicaid Other | Attending: Emergency Medicine | Admitting: Emergency Medicine

## 2021-01-21 DIAGNOSIS — J45909 Unspecified asthma, uncomplicated: Secondary | ICD-10-CM | POA: Diagnosis not present

## 2021-01-21 DIAGNOSIS — K5900 Constipation, unspecified: Secondary | ICD-10-CM

## 2021-01-21 DIAGNOSIS — K8020 Calculus of gallbladder without cholecystitis without obstruction: Secondary | ICD-10-CM | POA: Diagnosis not present

## 2021-01-21 DIAGNOSIS — K625 Hemorrhage of anus and rectum: Secondary | ICD-10-CM | POA: Insufficient documentation

## 2021-01-21 DIAGNOSIS — I10 Essential (primary) hypertension: Secondary | ICD-10-CM | POA: Diagnosis not present

## 2021-01-21 DIAGNOSIS — Z79899 Other long term (current) drug therapy: Secondary | ICD-10-CM | POA: Diagnosis not present

## 2021-01-21 DIAGNOSIS — K602 Anal fissure, unspecified: Secondary | ICD-10-CM

## 2021-01-21 DIAGNOSIS — Z8542 Personal history of malignant neoplasm of other parts of uterus: Secondary | ICD-10-CM | POA: Insufficient documentation

## 2021-01-21 LAB — COMPREHENSIVE METABOLIC PANEL
ALT: 16 U/L (ref 0–44)
AST: 24 U/L (ref 15–41)
Albumin: 3.4 g/dL — ABNORMAL LOW (ref 3.5–5.0)
Alkaline Phosphatase: 96 U/L (ref 38–126)
Anion gap: 6 (ref 5–15)
BUN: 15 mg/dL (ref 6–20)
CO2: 27 mmol/L (ref 22–32)
Calcium: 9.9 mg/dL (ref 8.9–10.3)
Chloride: 106 mmol/L (ref 98–111)
Creatinine, Ser: 0.94 mg/dL (ref 0.44–1.00)
GFR, Estimated: >60 mL/min (ref >60)
Glucose, Bld: 146 mg/dL — ABNORMAL HIGH (ref 70–99)
Potassium: 3.7 mmol/L (ref 3.5–5.1)
Sodium: 139 mmol/L (ref 135–145)
Total Bilirubin: 0.4 mg/dL (ref 0.3–1.2)
Total Protein: 6.9 g/dL (ref 6.5–8.1)

## 2021-01-21 LAB — CBC
HCT: 36.7 % (ref 36.0–46.0)
Hemoglobin: 11 g/dL — ABNORMAL LOW (ref 12.0–15.0)
MCH: 23.8 pg — ABNORMAL LOW (ref 26.0–34.0)
MCHC: 30 g/dL (ref 30.0–36.0)
MCV: 79.4 fL — ABNORMAL LOW (ref 80.0–100.0)
Platelets: 245 10*3/uL (ref 150–400)
RBC: 4.62 MIL/uL (ref 3.87–5.11)
RDW: 21.6 % — ABNORMAL HIGH (ref 11.5–15.5)
WBC: 9.2 10*3/uL (ref 4.0–10.5)
nRBC: 0 % (ref 0.0–0.2)

## 2021-01-21 LAB — TYPE AND SCREEN
ABO/RH(D): B POS
Antibody Screen: NEGATIVE

## 2021-01-21 LAB — POC OCCULT BLOOD, ED: Fecal Occult Bld: NEGATIVE

## 2021-01-21 MED ORDER — IOHEXOL 300 MG/ML  SOLN
100.0000 mL | Freq: Once | INTRAMUSCULAR | Status: AC | PRN
  Administered 2021-01-21: 100 mL via INTRAVENOUS

## 2021-01-21 MED ORDER — HYDROCORTISONE ACETATE 25 MG RE SUPP: 25.0000 mg | Freq: Two times a day (BID) | RECTAL | 0 refills | Status: DC

## 2021-01-21 MED ORDER — POLYETHYLENE GLYCOL 3350 17 GM/SCOOP PO POWD: 1.0000 | Freq: Once | ORAL | 0 refills | Status: AC

## 2021-01-21 NOTE — ED Provider Notes (Signed)
[facility] EMERGENCY DEPARTMENT Provider Note   CSN: [id number] Arrival date & time: 01/21/21  0645     History Chief Complaint  Patient presents with  . Rectal Bleeding    Sarah Bean is a 55 y.o. female.  The history is provided by the patient.  Rectal Bleeding Quality:  Bright red Amount:  Moderate Duration:  2 weeks Timing:  Intermittent Chronicity:  New Context: constipation and defecation   Similar prior episodes: no   Relieved by:  None tried Worsened by:  Defecation and wiping Ineffective treatments:  None tried Associated symptoms: abdominal pain   Associated symptoms: no fever, no hematemesis, no light-headedness, no loss of consciousness and no vomiting   Associated symptoms comment:  No urinary complaints.  Stools are very large in volume and she only has a stool every 2 to 3 days.  It is painful when having a bowel movement and she reports some burning in her rectum however for the last 1 to 2 weeks she is having discomfort in her suprapubic area that is present almost all the time but denies any urinary complaints.  She has had a complete hysterectomy so has no longer having issues with vaginal bleeding. Risk factors: no anticoagulant use, no hx of colorectal cancer and no hx of colorectal surgery   Risk factors comment:  Has never had a colonoscopy      Past Medical History:  Diagnosis Date  . Anemia   . Anxiety   . Asthma   . Depression   . Essential hypertension 05/26/2017  . GERD (gastroesophageal reflux disease)   . History of radiation therapy 10/18/2020-11/22/2020   Endometrium; Dr. Gery Pray  . Insomnia   . Manic depression ([place])   . Obesity   . Pneumonia    history of  . Restless leg syndrome   . Stroke ([place])   . Uterine cancer [facility])     Patient Active Problem List   Diagnosis Date Noted  . Endometrial cancer ([place]) 08/29/2020  . Fibroid uterus 08/29/2020  . Abnormal uterine bleeding (AUB) 06/13/2020  . TIA  (transient ischemic attack) 08/19/2019  . Morbid obesity ([place]) 08/19/2019  . Essential hypertension 05/26/2017  . Hyperglycemia 05/26/2017  . Microcytic anemia 05/26/2017  . CVA (cerebral vascular accident) ([place]) 05/25/2017    Past Surgical History:  Procedure Laterality Date  . ABDOMINAL HYSTERECTOMY    . BREAST LUMPECTOMY Left   . CESAREAN SECTION     x3  . ROBOTIC ASSISTED TOTAL HYSTERECTOMY WITH BILATERAL SALPINGO OOPHERECTOMY N/A 08/29/2020   Procedure: XI ROBOTIC ASSISTED TOTAL HYSTERECTOMY WITH BILATERAL SALPINGO OOPHORECTOMY, GREATER THAN 250 GRAMS, MINI LAPAROTOMY;  [name], MD;  [name];  Service: Gynecology;  [name];  . TUBAL LIGATION Bilateral 1990     OB History    Gravida  3   Para      Term      Preterm      AB      Living  3     SAB      IAB      Ectopic      Multiple      Live Births  3           Family History  Problem Relation Age of Onset  . CAD Mother 26  . Seizures Mother   . Other Father 87       struck by lightning  . Seizures Brother   . Cancer Maternal  Aunt        lung  . Cancer Maternal Grandmother        breast  . Cancer Paternal Grandmother        cervical  . Multiple sclerosis Daughter   . Autism Son   . Stroke Neg Hx     Social History   Tobacco Use  . Smoking status: Never Smoker  . Smokeless tobacco: Never Used  Vaping Use  . Vaping Use: Never used  Substance Use Topics  . Alcohol use: Not Currently    Comment: very seldom  . Drug use: Never    Home Medications Prior to Admission medications   Medication Sig Start Date End Date Taking? Authorizing Provider  albuterol (VENTOLIN HFA) 108 (90 Base) MCG/ACT inhaler [name] 07/28/20   Henderly, Britni A, PA-C  aspirin EC 81 MG tablet Take 1 tablet (81 mg total) by mouth daily. Swallow whole. Patient not taking: Reported on 12/21/2020  06/13/20   Frann Rider, NP  ibuprofen (ADVIL) 800 MG tablet [name] Patient taking differently: Take 200 mg by mouth every 8 (eight) hours as needed for moderate pain. 08/01/20   Cross, Lenna Sciara D, NP  lisinopril-hydrochlorothiazide (ZESTORETIC) 20-25 MG tablet [name] 06/06/20   [provider]  polyethylene glycol (MIRALAX / GLYCOLAX) 17 g packet Take 17 g by mouth daily as needed.    [provider]  QUEtiapine (SEROQUEL) 100 MG tablet Take 100 mg by mouth at bedtime.    [provider]  sertraline (ZOLOFT) 100 MG tablet [name] 06/09/20   [provider]  traZODone (DESYREL) 100 MG tablet [name]     [provider]    Allergies    Patient has no known allergies.  Review of Systems   Review of Systems  Constitutional: Negative for fever.  Gastrointestinal: Positive for abdominal pain and hematochezia. Negative for hematemesis and vomiting.  Neurological: Negative for loss of consciousness and light-headedness.  All other systems reviewed and are negative.   Physical Exam Updated Vital Signs BP (!) 148/81   Pulse 77   Temp 98.2 F (36.8 C)   Resp 16   Ht 5\' 2"  (1.575 m)   Wt 116.6 kg   SpO2 91%   BMI 47.02 kg/m   Physical Exam Vitals and nursing note reviewed.  Constitutional:      General: She is not in acute distress.    Appearance: Normal appearance. She is well-developed.  HENT:     Head: Normocephalic and atraumatic.  Eyes:     Pupils: Pupils are equal, round, and reactive to light.  Cardiovascular:     Rate and Rhythm: Normal rate and regular rhythm.     Heart sounds: Normal heart sounds. No murmur heard. No friction rub.  Pulmonary:     Effort: Pulmonary effort is normal.     Breath sounds: Normal breath sounds. No wheezing or rales.  Abdominal:      General: Bowel sounds are normal. There is no distension.     Palpations: Abdomen is soft.     Tenderness: There is abdominal tenderness in the suprapubic area. There is no guarding or rebound.  Genitourinary:    Rectum: Guaiac result negative.  Musculoskeletal:        General: No tenderness. Normal range of motion.     Comments: No edema  Skin:    General: Skin is warm and dry.     Findings: No rash.  Neurological:     Mental Status: She is alert and oriented to person, place, and time. Mental status is at baseline.     Cranial Nerves: No cranial nerve deficit.  Psychiatric:        Mood and Affect: Mood normal.        Behavior: Behavior normal.     ED Results / Procedures / Treatments   Labs (all labs ordered are listed, but only abnormal results are displayed) Labs Reviewed  COMPREHENSIVE METABOLIC PANEL - Abnormal; Notable for the following components:      Result Value   Glucose, Bld 146 (*)    Albumin 3.4 (*)    All other components within normal limits  CBC - Abnormal; Notable for the following components:   Hemoglobin 11.0 (*)    MCV 79.4 (*)    MCH 23.8 (*)    RDW 21.6 (*)    All other components within normal limits  POC OCCULT BLOOD, ED  POC OCCULT BLOOD, ED  TYPE AND SCREEN    EKG None  Radiology CT ABDOMEN PELVIS W CONTRAST  Result Date: 01/21/2021 CLINICAL DATA:  Rectal bleeding.  Evaluate for diverticulitis. EXAM: CT ABDOMEN AND PELVIS WITH CONTRAST TECHNIQUE: Multidetector CT imaging of the abdomen and pelvis was performed using the standard protocol following bolus administration of intravenous contrast. CONTRAST:  165mL OMNIPAQUE IOHEXOL 300 MG/ML  SOLN COMPARISON:  Chest CT-07/28/2020 FINDINGS: Lower chest: Limited visualization of the lower thorax is negative for focal airspace opacity or pleural effusion. Normal heart size.  No pericardial effusion. Hepatobiliary: Normal hepatic contour. There is mild diffuse decreased attenuation of the  hepatic parenchyma on this postcontrast examination suggestive of hepatic steatosis. There are multiple peripheral hyperattenuating gallstones within an otherwise normal appearing gallbladder with dominant stone measuring approximately 2.2 cm in diameter (image 56, series 6). No associated gallbladder wall thickening or pericholecystic stranding. No intra extrahepatic biliary ductal dilatation. No ascites. Pancreas: Normal appearance of the pancreas. Spleen: Normal appearance of the spleen. Adrenals/Urinary Tract: There is symmetric enhancement and excretion of the bilateral kidneys. Note is made of an approximately 2.5 cm hypoattenuating nonenhancing cyst within the inferior pole the right kidney (image 41, series 3). No discrete left-sided renal lesions. No evidence of nephrolithiasis on this postcontrast examination. No urinary obstruction or perinephric stranding. Normal appearance of the bilateral adrenal glands. Normal appearance of the urinary bladder given degree of distention. Stomach/Bowel: Large colonic stool burden without evidence of enteric obstruction. The cecum is noted to be located within the right mid hemiabdomen. Normal appearance of the terminal ileum and the appendix. Small hiatal hernia. No discrete areas of bowel wall thickening. No pneumoperitoneum, pneumatosis or portal venous gas. Vascular/Lymphatic: Normal caliber of the abdominal aorta. The major branch vessels of the abdominal aorta appear patent on this non CTA examination. No bulky retroperitoneal, mesenteric, pelvic or inguinal lymphadenopathy. Reproductive: Post hysterectomy. No discrete adnexal lesions. No free fluid within the pelvic cul-de-sac. Other: Soft tissue thickening and postoperative change of the ventral wall of the upper abdomen, likely at the location of previous laparotomy port without associated hernia (representative image 34, series 3). There is a moderate amount of subcutaneous edema about the midline of the low  back. Musculoskeletal: No acute or aggressive osseous abnormalities.  Mild multilevel lumbar spine DDD, worse at L5-S1 with disc space height loss, endplate irregularity and sclerosis. Bilateral facet degenerative change of the lower lumbar spine. IMPRESSION: 1. No definite explanation for patient's rectal bleeding. Specifically, no evidence of diverticulitis, enteric or urinary obstruction. Normal appearance of the appendix. 2. Large colonic stool burden without evidence of enteric obstruction. 3. Rather extensive cholelithiasis without evidence of cholecystitis however given the extensive stone burden within the gallbladder further evaluation with right upper quadrant ultrasound could be performed as indicated. 4. Small hiatal hernia. 5. Suspected hepatic steatosis. Correlation with LFTs is advised. Electronically Signed   By: Sandi Mariscal M.D.   On: 01/21/2021 09:49    Procedures Procedures   Medications Ordered in ED Medications - No data to display  ED Course  I have reviewed the triage vital signs and the nursing notes.  Pertinent labs & imaging results that were available during my care of the patient were reviewed by me and considered in my medical decision making (see chart for details).    MDM Rules/Calculators/A&P                          Patient presenting today with bright red and slightly darker blood mixed with her stool for the last 2 weeks.  It has been intermittent but she also has pain in her rectum when this occurs.  She also has noted some suprapubic abdominal pain for the last 1 week but does not seem to be affected by eating.  She has had no nausea or vomiting.  She is well-appearing on exam.  She does not take anticoagulation.  Complete hysterectomy so no possibility of that causing her issues.  On exam she does have a anal fissure at 6:00 that is tender to the touch but no active bleeding or hemorrhoids at this time.  Patient has never had a colonoscopy.  Given she is having  the midline abdominal pain we will do a CT to ensure no diverticulitis or other acute colitis that could be causing bleeding.  Suspect because of her longstanding history of constipation only having a stool every 2 to 3 days and being large in caliber it is because the anal fissure which is causing bleeding with bowel movements.  Hemoglobin today is stable at 11.  CMP and CT are pending.  10:09 AM CMP without acute findings.  CT with extensive cholelithiasis but no evidence of cholecystitis.  Patient has no right upper quadrant pain today or findings concerning for cholecystitis.  She also has a large colonic stool burden without evidence of enteric obstruction but is most likely the cause of her lower abdominal discomfort.  Feel the patient's bleeding is most likely related to an anal fissure.  Will have patient use Anusol suppositories.  Also patient will be placed on a bowel prep regime and given referral to gastroenterology as she is also due for colonoscopy. Final Clinical Impression(s) / ED Diagnoses Final diagnoses:  None    Rx / DC Orders ED Discharge Orders    None       Blanchie Dessert, MD 01/21/21 [phone]

## 2021-01-21 NOTE — ED Triage Notes (Signed)
Noticed bright red blood after bowel movement x few days and intermittent abdominal pain. Denies nausea and vomiting. Hx of endometrial cancer.

## 2021-01-21 NOTE — Discharge Instructions (Signed)
You have a small tear of your rectum that is causing bleeding when you have bowel movements.  These can be improved by using the suppository that you were given a prescription for but you also need to start taking the MiraLAX to make your stools softer.  Also sitting in a tub of warm water for 20 minutes a day can also help with the healing process.

## 2021-01-30 ENCOUNTER — Other Ambulatory Visit: Payer: Self-pay | Admitting: Nurse Practitioner

## 2021-01-30 DIAGNOSIS — Z1231 Encounter for screening mammogram for malignant neoplasm of breast: Secondary | ICD-10-CM

## 2021-03-15 ENCOUNTER — Telehealth: Payer: Self-pay | Admitting: *Deleted

## 2021-03-15 NOTE — Telephone Encounter (Signed)
Patient called and scheduled a follow up appt for June 3

## 2021-03-22 ENCOUNTER — Ambulatory Visit: Payer: Medicaid Other

## 2021-03-23 ENCOUNTER — Encounter: Payer: Self-pay | Admitting: Gynecologic Oncology

## 2021-03-23 ENCOUNTER — Ambulatory Visit: Payer: Medicaid Other | Admitting: Gynecologic Oncology

## 2021-03-23 DIAGNOSIS — C541 Malignant neoplasm of endometrium: Secondary | ICD-10-CM

## 2021-04-06 ENCOUNTER — Ambulatory Visit: Payer: Medicaid Other | Admitting: Gynecologic Oncology

## 2021-04-06 DIAGNOSIS — C541 Malignant neoplasm of endometrium: Secondary | ICD-10-CM

## 2021-04-16 ENCOUNTER — Telehealth: Payer: Self-pay | Admitting: *Deleted

## 2021-04-16 NOTE — Telephone Encounter (Signed)
Returned the patient's call and reschedule a missed appt

## 2021-04-24 ENCOUNTER — Encounter: Payer: Self-pay | Admitting: Gynecologic Oncology

## 2021-04-26 ENCOUNTER — Encounter: Payer: Self-pay | Admitting: Gynecologic Oncology

## 2021-04-26 ENCOUNTER — Inpatient Hospital Stay: Payer: Medicaid Other | Attending: Gynecologic Oncology | Admitting: Gynecologic Oncology

## 2021-04-26 ENCOUNTER — Other Ambulatory Visit: Payer: Self-pay

## 2021-04-26 VITALS — BP 114/90 | HR 72 | Temp 98.5°F | Resp 20 | Ht 62.0 in | Wt 248.0 lb

## 2021-04-26 DIAGNOSIS — C541 Malignant neoplasm of endometrium: Secondary | ICD-10-CM

## 2021-04-26 DIAGNOSIS — Z8542 Personal history of malignant neoplasm of other parts of uterus: Secondary | ICD-10-CM | POA: Insufficient documentation

## 2021-04-26 NOTE — Progress Notes (Signed)
Follow-up Note: Gyn-Onc  Consult was requested by Dr. [name] for the evaluation of Sarah Bean 55 y.o. female  CC:  Chief Complaint  Patient presents with   Endometrial cancer [facility])    Assessment/Plan:  Sarah Bean  is a 55 y.o.  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 MMR normal/MSI stable.  High/intermediate risk factors for recurrence. S/p vaginal brachytherapy completed 11/22/20.  I recommend she follow-up at 3 monthly intervals for symptom review, physical examination and pelvic examination. Pap smear is not recommended in routine endometrial cancer surveillance. After 2 years we will space these visits to every 6 months, and then annually if recurrence has not developed within 5 years.  She will follow-up with Dr Sondra Come in 3 months and my partner, Dr Berline Lopes, in 6 months for surveillance exams.   HPI: Sarah Bean is a 55 year old P 3 who was seen in consultation at the request of Dr [name] for evaluation and treatment of grade 2 endometrial cancer.   Her symptoms began approximately 10 years prior to diagnosis with abnormal uterine bleeding.  [name]  When her symptoms escalated with respect to the volume of bleeding she sought evaluation with Dr. Elly Modena on 06/22/2020.  Work-up of symptoms included a transvaginal ultrasound scan and endometrial biopsy. Transvaginal US on June 22, 2020 showed a uterus measuring 14.9 x 12.3 x 10.5 cm.  There were multiple fibroids within the uterus including a partially exophytic posterior uterine wall fibroid measuring 8 cm.  The endometrium was thick at 19 mm.  The left and right ovaries were not seen. Endometrial sampling with office endometrial [place] was performed on 07/17/20 and showed FIGO grade 1 endometrioid adenocarcinoma with squamous  differentiation.   On 08/29/20 she underwent robotic assisted total hysterectomy for uterus greater than 250 g, [name], mini laparotomy for specimen delivery. Intraoperative findings were significant for morbid obesity with a BMI of 45 kg meters squared, a 20 cm uterus spanning sidewall to sidewall with limited sidewall visualization and exposure.  Normal ovaries bilaterally.  The uterus weighed 1300 g in the operating room.  Sentinel lymph node mapping was unable to be performed due to extreme abdominal obesity and the large bulky uterus preventing visualization of the sidewalls. Surgery was challenging due to body habitus but uncomplicated.  She required mini laparotomy for specimen delivery.  Final pathology revealed a grade 2 endometrioid endometrial adenocarcinoma with 2 mm of 41 mm myometrial invasion (inner half).  LVSI was present but focal.  The adnexa and cervix were negative for metastatic carcinoma. MMR intact, MSI stable.  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 [name]  Interval Hx:  She completed 30Gy vaginal brachytherapy 10/18/20 through 11/22/20.  [name], 2022 and CT abd/pelvis was performed which was negative.   Current Meds:  Outpatient Encounter Medications as of 04/26/2021  Medication Sig   albuterol (VENTOLIN HFA) 108 (90 Base) MCG/ACT inhaler [name]   lisinopril-hydrochlorothiazide (ZESTORETIC) 20-25 MG tablet [name]   QUEtiapine (SEROQUEL)  100 MG tablet Take 150 mg by mouth at bedtime.   sertraline (ZOLOFT) 100 MG tablet [name]   traZODone (DESYREL) 100 MG tablet Take 200 mg by mouth at  bedtime.    GAVILAX 17 GM/SCOOP powder Take by mouth. (Patient not taking: No sig reported)   hydrocortisone (ANUSOL-HC) 25 MG suppository [name] (Patient not taking: No sig reported)   ibuprofen (ADVIL) 800 MG tablet [name] (Patient not taking: No sig reported)   No facility-administered encounter medications on file as of 04/26/2021.    Allergy: No Known Allergies  Social Hx:   Social History   Socioeconomic History   Marital status: Legally Separated    Spouse name: Not on file   Number of children: Not on file   Years of education: Not on file   Highest education level: Not on file  Occupational History   Occupation: disabled  Tobacco Use   Smoking status: Never   Smokeless tobacco: Never  Vaping Use   Vaping Use: Never used  Substance and Sexual Activity   Alcohol use: Not Currently    Comment: very seldom   Drug use: Never   Sexual activity: Not Currently    Partners: Male    Birth control/protection: None, [name], who has autism. He vocalizes and acts out.   Social Determinants of Health   Financial Resource Strain: Not on file  Food Insecurity: Not on file  Transportation Needs: Not on file  Physical Activity: Not on file  Stress: Not on file  Social Connections: Not on file  Intimate Partner Violence: Not on file    Past Surgical Hx:  Past Surgical History:  Procedure Laterality Date   ABDOMINAL HYSTERECTOMY     BREAST LUMPECTOMY Left    CESAREAN SECTION     x3   ROBOTIC ASSISTED TOTAL HYSTERECTOMY WITH BILATERAL SALPINGO OOPHERECTOMY N/A 08/29/2020   Procedure: XI ROBOTIC ASSISTED TOTAL HYSTERECTOMY WITH BILATERAL SALPINGO OOPHORECTOMY, GREATER THAN 250 GRAMS, MINI LAPAROTOMY;  [name], MD;  [name];  Service: Gynecology;  [name];   TUBAL LIGATION Bilateral 1990    Past Medical Hx:  Past Medical History:  Diagnosis Date   Anemia    Anxiety    Asthma    Depression    Essential hypertension 05/26/2017   GERD (gastroesophageal reflux disease)    History of radiation therapy 10/18/2020-11/22/2020   Endometrium; Dr. 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[place])    Obesity    Pneumonia    history of   Restless leg syndrome    Stroke [facility])    Uterine cancer [facility])     Past Gynecological History:  Pap in 2021 normal and negative high risk HPV No LMP recorded.  Family Hx:  Family History  Problem Relation Age of Onset   CAD Mother 66   Seizures Mother    Other Father 77       struck by lightning   Seizures Brother    Cancer Maternal Aunt        lung   Cancer Maternal Grandmother        breast   Cancer Paternal Grandmother  cervical   Multiple sclerosis Daughter    Autism Son    Stroke Neg Hx     Review of Systems:  Constitutional  Feels well,    ENT Normal appearing ears and nares bilaterally Skin/Breast  No rash, sores, jaundice, itching, dryness Cardiovascular  No chest pain, shortness of breath, or edema  Pulmonary  No cough or wheeze.  Gastro Intestinal  No nausea, vomitting, or diarrhoea. No bright red blood per rectum, no abdominal pain, change in bowel movement, or constipation.  Genito Urinary  No frequency, urgency, dysuria, no bleeding Musculo Skeletal  No myalgia, arthralgia, joint swelling or pain  Neurologic  No weakness, numbness, change in gait,  Psychology  No depression, anxiety, insomnia.   Vitals:  Blood pressure 114/90, pulse 72, temperature 98.5 F (36.9 C), temperature source Oral, resp. rate 20, height _0  (1.575 m), weight 248 lb (112.5 kg), SpO2 96 %.  Physical Exam: WD in NAD Neck  Supple NROM, without any enlargements.  Lymph Node Survey No cervical supraclavicular or  inguinal adenopathy Cardiovascular  Well perfused peripheries.  Lungs  No increased WOB Skin  No rash/lesions/breakdown  Psychiatry  Alert and oriented to person, place, and time  Abdomen  Normoactive bowel sounds, abdomen soft, non-tender and obese, + pannus, without evidence of hernia. Soft incisions . Back No CVA tenderness Genito Urinary  Vaginal cuff smooth, soft, no masses or lesions.  Rectal  deferred Extremities  No bilateral cyanosis, clubbing or edema.   Thereasa Solo, MD  04/26/2021, 1:35 PM

## 2021-04-26 NOTE — Patient Instructions (Addendum)
Please notify Dr Denman George at phone number [phone] if you notice vaginal bleeding, new pelvic or abdominal pains, bloating, feeling full easy, or a change in bladder or bowel function.   In November call Dr. Serita Grit office to request an appointment with Dr Serita Grit partner Dr Berline Lopes for January 2023

## 2021-05-01 ENCOUNTER — Encounter: Payer: Self-pay | Admitting: Gastroenterology

## 2021-05-09 ENCOUNTER — Other Ambulatory Visit: Payer: Self-pay

## 2021-05-09 ENCOUNTER — Encounter (HOSPITAL_COMMUNITY): Payer: Self-pay | Admitting: Emergency Medicine

## 2021-05-09 ENCOUNTER — Emergency Department (HOSPITAL_COMMUNITY): Payer: Medicaid Other

## 2021-05-09 ENCOUNTER — Emergency Department (HOSPITAL_COMMUNITY)
Admission: EM | Admit: 2021-05-09 | Discharge: 2021-05-09 | Disposition: A | Discharge status: Home / Self Care | Payer: Medicaid Other | Attending: Emergency Medicine | Admitting: Emergency Medicine

## 2021-05-09 DIAGNOSIS — Z5321 Procedure and treatment not carried out due to patient leaving prior to being seen by health care provider: Secondary | ICD-10-CM | POA: Insufficient documentation

## 2021-05-09 DIAGNOSIS — R079 Chest pain, unspecified: Secondary | ICD-10-CM | POA: Diagnosis present

## 2021-05-09 LAB — COMPREHENSIVE METABOLIC PANEL
ALT: 33 U/L (ref 0–44)
AST: 20 U/L (ref 15–41)
Albumin: 3.8 g/dL (ref 3.5–5.0)
Alkaline Phosphatase: 126 U/L (ref 38–126)
Anion gap: 7 (ref 5–15)
BUN: 12 mg/dL (ref 6–20)
CO2: 27 mmol/L (ref 22–32)
Calcium: 10.4 mg/dL — ABNORMAL HIGH (ref 8.9–10.3)
Chloride: 105 mmol/L (ref 98–111)
Creatinine, Ser: 0.98 mg/dL (ref 0.44–1.00)
GFR, Estimated: >60 mL/min (ref >60)
Glucose, Bld: 85 mg/dL (ref 70–99)
Potassium: 4 mmol/L (ref 3.5–5.1)
Sodium: 139 mmol/L (ref 135–145)
Total Bilirubin: 0.8 mg/dL (ref 0.3–1.2)
Total Protein: 7.5 g/dL (ref 6.5–8.1)

## 2021-05-09 LAB — CBC WITH DIFFERENTIAL/PLATELET
Abs Immature Granulocytes: 0.03 10*3/uL (ref 0.00–0.07)
Basophils Absolute: 0 10*3/uL (ref 0.0–0.1)
Basophils Relative: 0 %
Eosinophils Absolute: 0 10*3/uL (ref 0.0–0.5)
Eosinophils Relative: 0 %
HCT: 40.6 % (ref 36.0–46.0)
Hemoglobin: 12.9 g/dL (ref 12.0–15.0)
Immature Granulocytes: 0 %
Lymphocytes Relative: 12 %
Lymphs Abs: 1.3 10*3/uL (ref 0.7–4.0)
MCH: 26.9 pg (ref 26.0–34.0)
MCHC: 31.8 g/dL (ref 30.0–36.0)
MCV: 84.8 fL (ref 80.0–100.0)
Monocytes Absolute: 0.4 10*3/uL (ref 0.1–1.0)
Monocytes Relative: 3 %
Neutro Abs: 8.6 10*3/uL — ABNORMAL HIGH (ref 1.7–7.7)
Neutrophils Relative %: 85 %
Platelets: 262 10*3/uL (ref 150–400)
RBC: 4.79 MIL/uL (ref 3.87–5.11)
RDW: 16.1 % — ABNORMAL HIGH (ref 11.5–15.5)
WBC: 10.2 10*3/uL (ref 4.0–10.5)
nRBC: 0 % (ref 0.0–0.2)

## 2021-05-09 LAB — TROPONIN I (HIGH SENSITIVITY)
Troponin I (High Sensitivity): 16 ng/L (ref <18)
Troponin I (High Sensitivity): 20 ng/L — ABNORMAL HIGH (ref <18)

## 2021-05-09 NOTE — ED Triage Notes (Signed)
Patient BIB [facility] from home. Complaint of chest pain following an argument with a family member. Patient endorses feeling a little better at triage.

## 2021-05-09 NOTE — ED Notes (Signed)
Pt stated she was LWBS

## 2021-05-09 NOTE — ED Notes (Signed)
Pt left AMA, advised to return if symptoms.

## 2021-05-09 NOTE — ED Provider Notes (Signed)
Emergency Medicine Provider Triage Evaluation Note  ZAYA KESSENICH , a 55 y.o. female  was evaluated in triage.  Pt complains of chest pressure and shortness of breath beginning during an argument occurring just prior to calling EMS.  Review of Systems  Positive: Chest pressure (lower chest), shortness of breath, nausea resolved Negative: Syncope, dizziness, abdominal pain  Physical Exam  BP 117/82 (BP Location: Right Arm)   Pulse (!) 112   Temp 98.8 F (37.1 C) (Oral)   SpO2 99%   Gen:   Awake, no distress   Resp:  Normal effort  MSK:   Moves extremities without difficulty  Other:  No lower extremity edema  Medical Decision Making  Medically screening exam initiated at 3:56 PM.  Appropriate orders placed.  CHANNEL PAPANDREA was informed that the remainder of the evaluation will be completed by another provider, this initial triage assessment does not replace that evaluation, and the importance of remaining in the ED until their evaluation is complete.     Lorayne Bender, PA-C 05/09/21 1602    Lajean Saver, MD 05/14/21 [phone]

## 2021-05-16 ENCOUNTER — Encounter: Payer: Self-pay | Admitting: Radiology

## 2021-05-17 ENCOUNTER — Telehealth: Payer: Self-pay | Admitting: *Deleted

## 2021-05-17 NOTE — Telephone Encounter (Signed)
CALLED PATIENT TO ASK ABOUT ALTERING FU ON 05-24-21 DUE TO DR. KINARD BEING IN THE OR, PATIENT AGREED TO COME ON 05-24-21 @ 9:15 AM

## 2021-05-24 ENCOUNTER — Ambulatory Visit
Admission: RE | Admit: 2021-05-24 | Discharge: 2021-05-24 | Disposition: A | Discharge status: Home / Self Care | Payer: Medicaid Other | Source: Ambulatory Visit | Attending: Radiation Oncology | Admitting: Radiation Oncology

## 2021-05-24 ENCOUNTER — Telehealth: Payer: Self-pay | Admitting: *Deleted

## 2021-05-24 NOTE — Telephone Encounter (Signed)
CALLED PATIENT TO ASK ABOUT RESCHEDULING MISSED FU FOR 05-24-21 RESCHEDULED FOR 06-11-21 @ 3:15 PM, SPOKE WITH PATIENT AND SHE AGREED TO NEW DATE AND TIME

## 2021-05-25 ENCOUNTER — Telehealth: Payer: Self-pay | Admitting: *Deleted

## 2021-05-25 NOTE — Telephone Encounter (Signed)
Osvaldo Angst CRNA,  Please review the anesthesia notes from surgery 2021.   Okay for [place]? Please advise. Thank you, Senie Lanese pv

## 2021-05-25 NOTE — Telephone Encounter (Signed)
Dr.Armbruster,  This patient is a direct screening colonoscopy, she has a difficult airway and needs to be done at hospital per Osvaldo Angst. Okay for direct hospital colonoscopy or OV first? Please advise. Thank you, Shanard Treto pv

## 2021-05-28 NOTE — Telephone Encounter (Signed)
Dr.Armbruster,  This patient was scheduled with you for a [facility] DIRECT screening colonoscopy. During chart prep for [place] is seen the difficult airway. That is way I asked you. This is what we normally do here in [place].  I spoke with the patient and made [place] appointment. Mailed appointment information to patient.

## 2021-06-08 NOTE — Progress Notes (Signed)
Radiation Oncology         [phone] ________________________________  Name: Sarah Bean MRN: [id number]  Date: 06/11/2021  DOB: 1966-02-19  Follow-Up Visit Note  CC: [facility]  [facility], [facility]    ICD-10-CM   1. Endometrial cancer (HCC)  C54.1       Diagnosis: Stage IA grade 2 endometrioid endometrial adenocarcinoma with LVSI  Interval Since Last Radiation:  6 months and 20 days   Radiation Treatment Dates: 10/18/2020 through 11/22/2020   Site: Vagina; Pelvis Technique: HDR-brachy Total Dose (Gy): 30/30 Dose per Fx (Gy): 6 Completed Fx: 5/5 Beam Energies: Ir-192  Narrative:  The patient returns today for routine follow-up, she was last seen here for follow-up on 12/21/20.           Of note: the patient has had several ED visits within the past 4 months. ED encounters are as follows: --The patient presented to the [facility] ED on 01/21/21 with intermittent rectal bleeding characterized by bright red and slightly dark blood mixed with her stool for 2 weeks. The patient reported having associated pain in her rectum when the bleeding would occur, as well as some suprapubic abdominal pain for a week. Given her presenting abdominal pain, a CT was performed to rule out diverticulitis or acute colitis. CT performed showed  extensive cholelithiasis but no evidence of cholecystitis. Large colonic stool burden and anal fissure was determined as the likely cause of her bleeding and pain. --On 05/09/21, the patient presented to the [facility] ED with lower chest pressure, nausea and SOB occurring during an argument prior to calling EMS. Chest x-ray showed no evidence of active cardiopulmonary disease. The patient apparently left AMA, she was advised to return if symptoms recurred.   The patient did see Dr. Denman George approximately 3 months ago and received a good report.  She will follow-up with Dr. Berline Lopes in 3 months from now.               Allergies:  has  No Known Allergies.  Meds: Current Outpatient Medications  Medication Sig Dispense Refill   albuterol (VENTOLIN HFA) 108 (90 Base) MCG/ACT inhaler [name] 8 g 0   GAVILAX 17 GM/SCOOP powder Take by mouth.     ibuprofen (ADVIL) 800 MG tablet [name] 30 tablet 0   lisinopril-hydrochlorothiazide (ZESTORETIC) 20-25 MG tablet [name]     QUEtiapine (SEROQUEL) 100 MG tablet [name]     sertraline (ZOLOFT) 100 MG tablet [name]     traZODone (DESYREL) 100 MG tablet [name]      hydrocortisone (ANUSOL-HC) 25 MG suppository [name] (Patient not taking: No sig reported) 12 suppository 0   No current facility-administered medications for this encounter.    Physical Findings: The patient is in no acute distress. Patient is alert and oriented.  height is '5\' 4"'$  (1.626 m) and weight is 261 lb 6.4 oz (118.6 kg). Her temperature is 97.3 F (36.3 C) (abnormal). Her blood pressure is 145/94 (abnormal) and her pulse is 78. Her respiration is 20 and oxygen saturation is 97%. .  No  significant changes. Lungs are clear to auscultation bilaterally. Heart has regular rate and rhythm. No palpable cervical, supraclavicular, or axillary adenopathy. Abdomen soft, non-tender, normal bowel sounds.  On pelvic examination the external genitalia are unremarkable.  A speculum exam was performed.  No mucosal lesions noted in the vaginal vault.  No lesions noted along the vaginal cuff.  On bimanual and rectovaginal examination vaginal cuff is noted to be intact.  No palpable masses.  Rectal sphincter tone noted to be normal.    Lab Findings: Lab Results  Component Value Date   WBC 10.2  05/09/2021   HGB 12.9 05/09/2021   HCT 40.6 05/09/2021   MCV 84.8 05/09/2021   PLT 262 05/09/2021    Radiographic Findings: No results found.  Impression:  Stage IA grade 2 endometrioid endometrial adenocarcinoma with LVSI  No evidence of recurrence on clinical exam today.  She does not appear to have any long-term effects from her vaginal brachytherapy.  Plan: Routine follow-up in 6 months.  Prior to this follow-up appointment the patient will see Dr. Berline Lopes in 3 months.   20 minutes of total time was spent for this patient encounter, including preparation, face-to-face counseling with the patient and coordination of care, physical exam, and documentation of the encounter. ____________________________________  Blair Promise, PhD, MD   This document serves as a record of services personally performed by Gery Pray, MD. It was created on his behalf by Roney Mans, a trained medical scribe. The creation of this record is based on the scribe's personal observations and the provider's statements to them. This document has been checked and approved by the attending provider.

## 2021-06-11 ENCOUNTER — Encounter: Payer: Self-pay | Admitting: Radiation Oncology

## 2021-06-11 ENCOUNTER — Ambulatory Visit
Admission: RE | Admit: 2021-06-11 | Discharge: 2021-06-11 | Disposition: A | Discharge status: Home / Self Care | Payer: Medicaid Other | Source: Ambulatory Visit | Attending: Radiation Oncology | Admitting: Radiation Oncology

## 2021-06-11 ENCOUNTER — Other Ambulatory Visit: Payer: Self-pay

## 2021-06-11 VITALS — BP 145/94 | HR 78 | Temp 97.3°F | Resp 20 | Ht 64.0 in | Wt 261.4 lb

## 2021-06-11 DIAGNOSIS — Z79899 Other long term (current) drug therapy: Secondary | ICD-10-CM | POA: Insufficient documentation

## 2021-06-11 DIAGNOSIS — Z8542 Personal history of malignant neoplasm of other parts of uterus: Secondary | ICD-10-CM | POA: Diagnosis not present

## 2021-06-11 DIAGNOSIS — Z923 Personal history of irradiation: Secondary | ICD-10-CM | POA: Insufficient documentation

## 2021-06-11 DIAGNOSIS — C541 Malignant neoplasm of endometrium: Secondary | ICD-10-CM

## 2021-06-11 NOTE — Progress Notes (Signed)
Sarah Bean is here today for follow up post radiation to the pelvic.  They completed their radiation on: 11/22/2020   Does the patient complain of any of the following:  Pain:denies Abdominal bloating: denies Diarrhea/Constipation: denies Nausea/Vomiting: denies Vaginal Discharge: white vaginal discharge Blood in Urine or Stool: denies Urinary Issues (dysuria/incomplete emptying/ incontinence/ increased frequency/urgency): dysuria, urgency Does patient report using vaginal dilator 2-3 times a week and/or sexually active 2-3 weeks: 3 times weekly Post radiation skin changes: denies   Additional comments if applicable: none  Vitals:   06/11/21 1456  BP: (!) 145/94  Pulse: 78  Resp: 20  Temp: (!) 97.3 F (36.3 C)  SpO2: 97%  Weight: 261 lb 6.4 oz (118.6 kg)  Height: '5\' 4"'$  (1.626 m)

## 2021-06-12 ENCOUNTER — Telehealth: Payer: Self-pay | Admitting: *Deleted

## 2021-06-12 NOTE — Telephone Encounter (Signed)
CALLED PATIENT TO INFORM OF FU WITH DR. Berline Lopes ON 08-24-21- ARRIVAL TIME- 2 PM, LVM FOR A RETURN CALL

## 2021-07-03 ENCOUNTER — Encounter: Payer: Medicaid Other | Admitting: Gastroenterology

## 2021-07-31 ENCOUNTER — Ambulatory Visit: Payer: Medicaid Other | Admitting: Gastroenterology

## 2021-07-31 NOTE — Progress Notes (Deleted)
HPI :    Past Medical History:  Diagnosis Date   Anemia    Anxiety    Asthma    Bipolar disorder ([place])    Depression    Essential hypertension 05/26/2017   GERD (gastroesophageal reflux disease)    History of radiation therapy 10/18/2020-11/22/2020   vaginal brachytherapy Endometrium; Dr. 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[place])    Obesity    OSA (obstructive sleep apnea)    Pneumonia    history of   Restless leg syndrome    Stroke [facility])    TIA (transient ischemic attack)    Uterine cancer [facility])    endometrial     Past Surgical History:  Procedure Laterality Date   ABDOMINAL HYSTERECTOMY  08/2020   BREAST LUMPECTOMY Left    CESAREAN SECTION     x3   ROBOTIC ASSISTED TOTAL HYSTERECTOMY WITH BILATERAL SALPINGO OOPHERECTOMY N/A 08/29/2020   Procedure: XI ROBOTIC ASSISTED TOTAL HYSTERECTOMY WITH BILATERAL SALPINGO OOPHORECTOMY, GREATER THAN 250 GRAMS, MINI LAPAROTOMY;  [name], MD;  [name];  Service: Gynecology;  [name];   TUBAL LIGATION Bilateral 1990   Family History  Problem Relation Age of Onset   CAD Mother 67   Seizures Mother    Other Father 53       struck by lightning   Seizures Brother    Cancer Maternal Aunt        lung   Cancer Maternal Grandmother        breast   Cancer Paternal Grandmother        cervical   Multiple sclerosis Daughter    Autism Son    Stroke Neg Hx    Social History   Tobacco Use   Smoking status: Never   Smokeless tobacco: Never  Vaping Use   Vaping Use: Never used  Substance Use Topics   Alcohol use: Not Currently    Comment: very seldom   Drug use: Never   Current Outpatient Medications  Medication Sig Dispense Refill   albuterol (VENTOLIN HFA) 108 (90 Base) MCG/ACT inhaler [name] 8 g 0   GAVILAX 17 GM/SCOOP powder Take by mouth.     hydrocortisone (ANUSOL-HC) 25 MG suppository  [name] (Patient not taking: No sig reported) 12 suppository 0   ibuprofen (ADVIL) 800 MG tablet [name] 30 tablet 0   lisinopril-hydrochlorothiazide (ZESTORETIC) 20-25 MG tablet [name]     QUEtiapine (SEROQUEL) 100 MG tablet [name]     sertraline (ZOLOFT) 100 MG tablet [name]     traZODone (DESYREL) 100 MG tablet [name]      No current facility-administered medications for this visit.   No Known Allergies   Review of Systems: All systems reviewed and negative except where noted in HPI.    No results found.  Physical Exam: There were no vitals taken for this visit. Constitutional: Pleasant,well-developed, ***female in no acute distress. HEENT: Normocephalic and atraumatic. Conjunctivae are normal. No scleral icterus. Neck supple.  Cardiovascular: Normal rate, regular rhythm.  Pulmonary/chest: Effort normal and breath sounds normal. No wheezing, rales or  rhonchi. Abdominal: Soft, nondistended, nontender. Bowel sounds active throughout. There are no masses palpable. No hepatomegaly. Extremities: no edema Lymphadenopathy: No cervical adenopathy noted. Neurological: Alert and oriented to person place and time. Skin: Skin is warm and dry. No rashes noted. Psychiatric: Normal mood and affect. Behavior is normal.   ASSESSMENT AND PLAN:  [facility], [facility]

## 2021-08-21 ENCOUNTER — Encounter: Payer: Self-pay | Admitting: Gynecologic Oncology

## 2021-08-22 ENCOUNTER — Ambulatory Visit (HOSPITAL_COMMUNITY)
Admission: EM | Admit: 2021-08-22 | Discharge: 2021-08-22 | Disposition: A | Discharge status: Home / Self Care | Payer: Medicaid Other | Attending: Medical Oncology | Admitting: Medical Oncology

## 2021-08-22 ENCOUNTER — Encounter (HOSPITAL_COMMUNITY): Payer: Self-pay

## 2021-08-22 ENCOUNTER — Other Ambulatory Visit: Payer: Self-pay

## 2021-08-22 DIAGNOSIS — K047 Periapical abscess without sinus: Secondary | ICD-10-CM

## 2021-08-22 MED ORDER — ACETAMINOPHEN-CODEINE #3 300-30 MG PO TABS: 1.0000 | ORAL_TABLET | Freq: Four times a day (QID) | ORAL | 0 refills | Status: DC | PRN

## 2021-08-22 MED ORDER — KETOROLAC TROMETHAMINE 60 MG/2ML IM SOLN
60.0000 mg | Freq: Once | INTRAMUSCULAR | Status: AC
  Administered 2021-08-22: 60 mg via INTRAMUSCULAR

## 2021-08-22 MED ORDER — KETOROLAC TROMETHAMINE 60 MG/2ML IM SOLN
INTRAMUSCULAR | Status: AC
  Filled 2021-08-22: qty 2

## 2021-08-22 MED ORDER — AMOXICILLIN-POT CLAVULANATE 875-125 MG PO TABS: 1.0000 | ORAL_TABLET | Freq: Two times a day (BID) | ORAL | 0 refills | Status: DC

## 2021-08-22 NOTE — ED Triage Notes (Signed)
Pt c/o rt lower toothache since Sunday. States can't get in to dentist until 11/26.

## 2021-08-22 NOTE — ED Provider Notes (Signed)
[facility]    CSN: [id number] Arrival date & time: 08/22/21  1040      History   Chief Complaint Chief Complaint  Patient presents with   Dental Pain    HPI Sarah Bean is a 55 y.o. female.   HPI  Dental Pain: Pt states that she has had a right lower dental pain since Sunday. No known injury to tooth. She reports that she has taken OTC tylenol and motrin for symptoms without relief. No fevers, SOB, trouble breathing or swallowing. She has a dental appointment but they cannot get her in to be seen until the next few weeks.   Past Medical History:  Diagnosis Date   Anemia    Anxiety    Asthma    Bipolar disorder ([place])    Depression    Essential hypertension 05/26/2017   GERD (gastroesophageal reflux disease)    History of radiation therapy 10/18/2020-11/22/2020   vaginal brachytherapy Endometrium; Dr. 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[place])    Obesity    OSA (obstructive sleep apnea)    Pneumonia    history of   Restless leg syndrome    Stroke [facility])    TIA (transient ischemic attack)    Uterine cancer [facility])    endometrial    Patient Active Problem List   Diagnosis Date Noted   Endometrial cancer ([place]) 08/29/2020   Fibroid uterus 08/29/2020   Abnormal uterine bleeding (AUB) 06/13/2020   TIA (transient ischemic attack) 08/19/2019   Morbid obesity ([place]) 08/19/2019   Essential hypertension 05/26/2017   Hyperglycemia 05/26/2017   Microcytic anemia 05/26/2017   CVA (cerebral vascular accident) ([place]) 05/25/2017    Past Surgical History:  Procedure Laterality Date   ABDOMINAL HYSTERECTOMY  08/2020   BREAST LUMPECTOMY Left    CESAREAN SECTION     x3   ROBOTIC ASSISTED TOTAL HYSTERECTOMY WITH BILATERAL SALPINGO OOPHERECTOMY N/A 08/29/2020   Procedure: XI ROBOTIC ASSISTED TOTAL HYSTERECTOMY WITH BILATERAL SALPINGO OOPHORECTOMY, GREATER THAN 250 GRAMS, MINI LAPAROTOMY;  [name], MD;  [name];  Service: Gynecology;   [name];   TUBAL LIGATION Bilateral 1990    OB History     Gravida  3   Para      Term      Preterm      AB      Living  3      SAB      IAB      Ectopic      Multiple      Live Births  3            Home Medications    Prior to Admission medications   Medication Sig Start Date End Date Taking? Authorizing Provider  albuterol (VENTOLIN HFA) 108 (90 Base) MCG/ACT inhaler [name] 07/28/20   Henderly, Britni A, PA-C  GAVILAX 17 GM/SCOOP powder Take by mouth. Patient not taking: Reported on 08/21/2021 01/21/21   [provider]  hydrocortisone (ANUSOL-HC) 25 MG suppository [name] 01/21/21   Blanchie Dessert, MD  ibuprofen (ADVIL) 800 MG tablet [name] 08/01/20   Joylene John D, NP  lisinopril-hydrochlorothiazide (ZESTORETIC) 20-25 MG tablet [name]  06/06/20   [provider]  QUEtiapine (SEROQUEL) 100 MG tablet [name]    [provider]  sertraline (ZOLOFT) 100 MG tablet [name] 06/09/20   [provider]  traZODone (DESYREL) 100 MG tablet [name]     [provider]    Family History Family History  Problem Relation Age of Onset   CAD Mother 30   Seizures Mother    Other Father 91       struck by lightning   Seizures Brother    Cancer Maternal Aunt        lung   Cancer Maternal Grandmother        breast   Cancer Paternal Grandmother        cervical   Multiple sclerosis Daughter    Autism Son    Stroke Neg Hx     Social History Social History   Tobacco Use   Smoking status: Never   Smokeless tobacco: Never  Vaping Use   Vaping Use: Never used  Substance Use  Topics   Alcohol use: Not Currently    Comment: very seldom   Drug use: Never     Allergies   Patient has no known allergies.   Review of Systems Review of Systems  As stated above in HPI Physical Exam Triage Vital Signs ED Triage Vitals  Enc Vitals Group     BP 08/22/21 1302 (!) 151/115     Pulse Rate 08/22/21 1302 70     Resp 08/22/21 1302 18     Temp 08/22/21 1302 97.7 F (36.5 C)     Temp Source 08/22/21 1302 Oral     SpO2 08/22/21 1302 98 %     Weight --      Height --      Head Circumference --      Peak Flow --      Pain Score 08/22/21 1303 7     Pain Loc --      Pain Edu? --      Excl. in [place]? --    No data found.  Updated Vital Signs BP (!) 151/115 (BP Location: Left Arm)   Pulse 70   Temp 97.7 F (36.5 C) (Oral)   Resp 18   LMP 08/10/2020   SpO2 98%   Visual Acuity Right Eye Distance:   Left Eye Distance:   Bilateral Distance:    Right Eye Near:   Left Eye Near:    Bilateral Near:     Physical Exam Vitals and nursing note reviewed.  Constitutional:      General: She is not in acute distress.    Appearance: Normal appearance. She is obese. She is not ill-appearing, toxic-appearing or diaphoretic.  HENT:     Head: Normocephalic and atraumatic.     Nose: Nose normal.     Mouth/Throat:     Mouth: Mucous membranes are moist.     Tongue: No lesions.     Palate: No mass and lesions.     Pharynx: Oropharynx is clear. Uvula midline.      Comments: No edema, tenderness of inferior palate  Eyes:     Extraocular Movements: Extraocular movements intact.     Pupils: Pupils are equal, round, and reactive to light.  Cardiovascular:     Rate and Rhythm: Normal rate and regular rhythm.     Heart sounds: Normal heart sounds.  Pulmonary:     Breath sounds: Normal breath sounds.  Musculoskeletal:     Cervical back: Normal range of motion and neck supple.  Lymphadenopathy:     Cervical: No cervical adenopathy.  Neurological:     Mental Status: She  is alert.     UC Treatments / Results  Labs (all labs ordered are listed, but only abnormal results are displayed) Labs Reviewed - No data to display  EKG   Radiology No results found.  Procedures Procedures (including critical care time)  Medications Ordered in UC Medications - No data to display  Initial Impression / Assessment and Plan / UC Course  I have reviewed the triage vital signs and the nursing notes.  Pertinent labs & imaging results that were available during my care of the patient were reviewed by me and considered in my medical decision making (see chart for details).     New. Treating for dental abscess with pain medication and Augmentin to prevent systemic complications. Toradol here in office. Discussed medication side effects and precautions. Discussed red flag signs and symptoms.  Final Clinical Impressions(s) / UC Diagnoses   Final diagnoses:  None   Discharge Instructions   None    ED Prescriptions   None    PDMP not reviewed this encounter.   Hughie Closs, [place] 08/22/21 1350

## 2021-08-24 ENCOUNTER — Ambulatory Visit: Payer: Medicaid Other | Admitting: Gynecologic Oncology

## 2021-08-24 DIAGNOSIS — C541 Malignant neoplasm of endometrium: Secondary | ICD-10-CM

## 2021-08-24 NOTE — Progress Notes (Unsigned)
Gynecologic Oncology Return Clinic Visit  08/24/21  Reason for Visit: surveillance visit in the setting of HIR endometrial cancer  Treatment History: Her symptoms began approximately 10 years prior to diagnosis with abnormal uterine bleeding.  [name]  When her symptoms escalated with respect to the volume of bleeding she sought evaluation with Dr. Elly Modena on 06/22/2020.  Work-up of symptoms included a transvaginal ultrasound scan and endometrial biopsy. Transvaginal US on June 22, 2020 showed a uterus measuring 14.9 x 12.3 x 10.5 cm.  There were multiple fibroids within the uterus including a partially exophytic posterior uterine wall fibroid measuring 8 cm.  The endometrium was thick at 19 mm.  The left and right ovaries were not seen. 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    On 08/29/20 she underwent robotic assisted total hysterectomy for uterus greater than 250 g, [name], mini laparotomy for specimen delivery. Intraoperative findings were significant for morbid obesity with a BMI of 45 kg meters squared, a 20 cm uterus spanning sidewall to sidewall with limited sidewall visualization and exposure.  Normal ovaries bilaterally.  The uterus weighed 1300 g in the operating room.  Sentinel lymph node mapping was unable to be performed due to extreme abdominal obesity and the large bulky uterus preventing visualization of the sidewalls. Surgery was challenging due to body habitus but uncomplicated.  She required mini laparotomy for specimen delivery.  Final pathology revealed a grade 2 endometrioid endometrial adenocarcinoma with 2 mm of 41 mm myometrial invasion (inner half).  LVSI was present but focal.  The adnexa and cervix were negative for metastatic carcinoma. MMR intact, MSI stable.    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 [name]  [name], [name], no evidence of recurrent cancer.  Interval History: ***  Saw Dr. Sondra Come on 8/22, [name]  Past Medical/Surgical History: Past Medical History:  Diagnosis Date   Anemia    Anxiety    Asthma    Bipolar disorder ([place])    Depression    Essential hypertension 05/26/2017   GERD (gastroesophageal reflux disease)    History of radiation therapy 10/18/2020-11/22/2020   vaginal brachytherapy Endometrium; Dr. 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[place])    Obesity    OSA (obstructive sleep apnea)    Pneumonia    history of   Restless leg syndrome    Stroke [facility])    TIA (transient ischemic attack)    Uterine cancer [facility])    endometrial    Past Surgical History:  Procedure Laterality Date   ABDOMINAL HYSTERECTOMY  08/2020   BREAST LUMPECTOMY Left    CESAREAN SECTION     x3   ROBOTIC ASSISTED TOTAL HYSTERECTOMY WITH BILATERAL SALPINGO OOPHERECTOMY N/A 08/29/2020   Procedure: XI ROBOTIC ASSISTED TOTAL HYSTERECTOMY WITH BILATERAL SALPINGO OOPHORECTOMY, GREATER THAN 250 GRAMS, MINI LAPAROTOMY;  [name], MD;  [name];  Service: Gynecology;  [name];   TUBAL LIGATION Bilateral 1990    Family History  Problem Relation Age of Onset  CAD Mother 33   Seizures Mother    Other Father 23       struck by lightning   Seizures Brother    Cancer Maternal Aunt        lung   Cancer Maternal Grandmother        breast   Cancer Paternal Grandmother        cervical   Multiple sclerosis Daughter    Autism Son    Stroke Neg Hx      Social History   Socioeconomic History   Marital status: Legally Separated    Spouse name: Not on file   Number of children: Not on file   Years of education: Not on file   Highest education level: Not on file  Occupational History   Occupation: disabled  Tobacco Use   Smoking status: Never   Smokeless tobacco: Never  Vaping Use   Vaping Use: Never used  Substance and Sexual Activity   Alcohol use: Not Currently    Comment: very seldom   Drug use: Never   Sexual activity: Not Currently    Partners: Male    Birth control/protection: None, [name], [name], [name], [name], [name], [name], [name], [name], [name], [name], [name], [name], [name] , [name], [name], [name] , [name], [name], [name] , [name], [name] , [name] , [name], [name], [name], [name], [name] hours., [name] 14 tablet, Rfl: 0   GAVILAX 17 GM/SCOOP powder, Take by mouth. (Patient not taking: Reported on 08/21/2021), [name] , Rfl:   Review of Systems: Denies appetite changes, fevers, chills, fatigue, unexplained weight changes. Denies hearing loss, neck lumps or masses, mouth sores, ringing in ears or voice changes. Denies cough or wheezing.  Denies shortness of breath. Denies chest pain or palpitations. Denies leg swelling. Denies abdominal distention, pain, blood in stools, constipation, diarrhea, nausea, vomiting, or early satiety. Denies pain with intercourse, dysuria, frequency, hematuria or incontinence. Denies hot flashes, pelvic pain, vaginal bleeding or vaginal discharge.   Denies joint pain, back pain or muscle pain/cramps. Denies  itching, rash, or wounds. Denies dizziness, headaches, numbness or seizures. Denies swollen lymph nodes or glands, denies easy bruising or bleeding. Denies anxiety, depression, confusion, or decreased concentration.  Physical Exam: LMP 08/10/2020  General: ***Alert, oriented, no acute distress. HEENT: ***Posterior oropharynx clear, sclera anicteric. Chest: ***Clear to auscultation bilaterally.  ***Port site clean. Cardiovascular: ***Regular rate and rhythm, no murmurs. Abdomen: ***Obese, soft, nontender.  Normoactive bowel sounds.  No masses or hepatosplenomegaly appreciated.  ***Well-healed scar. Extremities: ***Grossly normal range of motion.  Warm, well perfused.  No edema bilaterally. Skin: ***No rashes or lesions noted. Lymphatics: ***No cervical, supraclavicular, or inguinal adenopathy. GU: Normal appearing external genitalia without erythema, excoriation, or lesions.  Speculum exam reveals ***.  Bimanual exam reveals ***.  ***Rectovaginal exam  confirms  ___.  Laboratory & Radiologic Studies: ***  Assessment & Plan: Sarah DICOSTANZO is a 55 y.o. 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 MMR normal/MSI stable.   High/intermediate risk factors for recurrence. S/p vaginal brachytherapy completed 11/22/20.   I recommend she follow-up at 3 monthly intervals for symptom review, physical examination and pelvic examination. Pap smear is not recommended in routine endometrial cancer surveillance. After 2 years we will space these visits to every 6 months, and then annually if recurrence has not developed within 5 years.  *** minutes of total time was spent for this patient encounter, including preparation, face-to-face counseling with the patient and coordination of care, and documentation of the encounter.  Jeral Pinch, MD  Division of Gynecologic Oncology  Department of Obstetrics and Gynecology  [facility]

## 2021-08-24 NOTE — Patient Instructions (Signed)
It was nice to meet you!  There is no evidence of recurrence on your exam today. You will see Dr. Sondra Come in 3 months and return to see me in 6 months. If you develop any new and concerning symptoms (vaginal bleeding, unintentional weight loss, pelvic pain, etc) before your next visit, please call to see me sooner.

## 2021-09-06 ENCOUNTER — Encounter: Payer: Self-pay | Admitting: Gastroenterology

## 2021-09-11 ENCOUNTER — Telehealth: Payer: Self-pay

## 2021-09-11 NOTE — Telephone Encounter (Signed)
Returning call to patient re: follow up appointment. Patient states she missed her appointment on 08/24/21 and needs to reschedule in December. Appointment scheduled for 10/01/21 at 3:30pm with Dr. Berline Lopes. Patient is in agreement of date and time of appointment.

## 2021-09-14 ENCOUNTER — Other Ambulatory Visit: Payer: Self-pay

## 2021-09-14 ENCOUNTER — Encounter (HOSPITAL_COMMUNITY): Payer: Self-pay | Admitting: Emergency Medicine

## 2021-09-14 ENCOUNTER — Ambulatory Visit (HOSPITAL_COMMUNITY)
Admission: EM | Admit: 2021-09-14 | Discharge: 2021-09-14 | Disposition: A | Discharge status: Home / Self Care | Payer: Medicaid Other | Attending: Emergency Medicine | Admitting: Emergency Medicine

## 2021-09-14 DIAGNOSIS — T25121A Burn of first degree of right foot, initial encounter: Secondary | ICD-10-CM

## 2021-09-14 DIAGNOSIS — T24111A Burn of first degree of right thigh, initial encounter: Secondary | ICD-10-CM | POA: Diagnosis not present

## 2021-09-14 DIAGNOSIS — T25221A Burn of second degree of right foot, initial encounter: Secondary | ICD-10-CM

## 2021-09-14 DIAGNOSIS — T2111XA Burn of first degree of chest wall, initial encounter: Secondary | ICD-10-CM | POA: Diagnosis not present

## 2021-09-14 MED ORDER — SILVER SULFADIAZINE 1 % EX CREA: 1.0000 "application " | TOPICAL_CREAM | Freq: Every day | CUTANEOUS | 0 refills | Status: DC

## 2021-09-14 MED ORDER — IBUPROFEN 600 MG PO TABS: 600.0000 mg | ORAL_TABLET | Freq: Four times a day (QID) | ORAL | 0 refills | Status: DC | PRN

## 2021-09-14 MED ORDER — HIBICLENS 4 % EX LIQD: Freq: Every day | CUTANEOUS | 0 refills | Status: DC | PRN

## 2021-09-14 NOTE — ED Triage Notes (Signed)
Pt reports pulling ham out oven in foil pain when collapsed and spilt down right side. Pt has blister to right foot. Reports pains when walking.
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
## 2021-09-14 NOTE — Discharge Instructions (Addendum)
Keep this clean with soap and water.  Hibiclens is an excellent surgical soap.  May take 600 mg of ibuprofen with 1000 mg of Tylenol together 3-4 times a day as needed for pain.  Ice or cool compress to the painful areas.  I have decided to leave the blisters intact and with them return home.  If we leave them intact, then there is no chance for infection.  It will give the skin under the blister time to heal, so it will not be so painful or prone to infection when it does burst.  Place Silvadene on top of all of your burns, including the blisters.  Wear loose socks, such as surgical socks.  No issues for several days.

## 2021-10-01 ENCOUNTER — Other Ambulatory Visit: Payer: Self-pay

## 2021-10-01 ENCOUNTER — Encounter: Payer: Self-pay | Admitting: Gynecologic Oncology

## 2021-10-01 ENCOUNTER — Inpatient Hospital Stay: Payer: Medicaid Other | Attending: Gynecologic Oncology | Admitting: Gynecologic Oncology

## 2021-10-01 VITALS — BP 116/57 | HR 91 | Temp 97.8°F | Resp 18 | Ht 64.0 in | Wt 261.9 lb

## 2021-10-01 DIAGNOSIS — Z8542 Personal history of malignant neoplasm of other parts of uterus: Secondary | ICD-10-CM | POA: Diagnosis not present

## 2021-10-01 DIAGNOSIS — Z79899 Other long term (current) drug therapy: Secondary | ICD-10-CM | POA: Diagnosis not present

## 2021-10-01 DIAGNOSIS — G2581 Restless legs syndrome: Secondary | ICD-10-CM | POA: Insufficient documentation

## 2021-10-01 DIAGNOSIS — J45909 Unspecified asthma, uncomplicated: Secondary | ICD-10-CM | POA: Insufficient documentation

## 2021-10-01 DIAGNOSIS — C541 Malignant neoplasm of endometrium: Secondary | ICD-10-CM

## 2021-10-01 DIAGNOSIS — Z8673 Personal history of transient ischemic attack (TIA), and cerebral infarction without residual deficits: Secondary | ICD-10-CM | POA: Insufficient documentation

## 2021-10-01 DIAGNOSIS — N898 Other specified noninflammatory disorders of vagina: Secondary | ICD-10-CM | POA: Insufficient documentation

## 2021-10-01 DIAGNOSIS — F419 Anxiety disorder, unspecified: Secondary | ICD-10-CM | POA: Insufficient documentation

## 2021-10-01 DIAGNOSIS — Z923 Personal history of irradiation: Secondary | ICD-10-CM | POA: Diagnosis not present

## 2021-10-01 DIAGNOSIS — Z791 Long term (current) use of non-steroidal anti-inflammatories (NSAID): Secondary | ICD-10-CM | POA: Insufficient documentation

## 2021-10-01 DIAGNOSIS — Z90722 Acquired absence of ovaries, bilateral: Secondary | ICD-10-CM | POA: Insufficient documentation

## 2021-10-01 DIAGNOSIS — I10 Essential (primary) hypertension: Secondary | ICD-10-CM | POA: Diagnosis not present

## 2021-10-01 DIAGNOSIS — K219 Gastro-esophageal reflux disease without esophagitis: Secondary | ICD-10-CM | POA: Insufficient documentation

## 2021-10-01 DIAGNOSIS — F319 Bipolar disorder, unspecified: Secondary | ICD-10-CM | POA: Diagnosis not present

## 2021-10-01 DIAGNOSIS — G4700 Insomnia, unspecified: Secondary | ICD-10-CM | POA: Diagnosis not present

## 2021-10-01 DIAGNOSIS — Z90710 Acquired absence of both cervix and uterus: Secondary | ICD-10-CM | POA: Insufficient documentation

## 2021-10-01 DIAGNOSIS — G4733 Obstructive sleep apnea (adult) (pediatric): Secondary | ICD-10-CM | POA: Diagnosis not present

## 2021-10-01 NOTE — Patient Instructions (Addendum)
It was a pleasure meeting you today.  I do not see or feel any evidence of cancer recurrence on your exam.  I will see you back in 6 months as we continue to alternate visits every 3 months between this clinic and radiation oncology.  If you develop any of the symptoms that we discussed between visits, please call to see me sooner.  The suppositories that we are talking about today are called boric acid vaginal suppositories.  These can help correct the vaginal flora.
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
## 2021-10-30 ENCOUNTER — Encounter: Payer: Self-pay | Admitting: Gastroenterology

## 2021-11-01 ENCOUNTER — Ambulatory Visit: Payer: Medicaid Other | Admitting: Gastroenterology

## 2021-12-04 ENCOUNTER — Ambulatory Visit: Payer: Medicaid Other | Admitting: Gastroenterology

## 2021-12-14 NOTE — Progress Notes (Signed)
Sarah Bean is here today for follow up post radiation to the pelvis.  They completed their radiation on: 11/22/2020   Does the patient complain of any of the following:  Pain: Patient denies pain.  Abdominal bloating: Yes Diarrhea/Constipation: Constipation, taking miralax as needed Nausea/Vomiting: No  Vaginal Discharge: Yes, thick white discharge Blood in Urine or Stool: no Urinary Issues (dysuria/incomplete emptying/ incontinence/ increased frequency/urgency): Patient reports having dysuria, and frequency. Does patient report using vaginal dilator 2-3 times a week and/or sexually active 2-3 weeks: Yes, patient is sexually active.  Post radiation skin changes: intact   Additional comments if applicable:   Vitals:   12/17/21 1445  BP: (!) 152/110  Pulse: (!) 102  Resp: 20  Temp: (!) 97.2 F (36.2 C)  TempSrc: Temporal  SpO2: 98%  Height: 5\' 4"  (1.626 m)
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
## 2021-12-17 ENCOUNTER — Ambulatory Visit
Admission: RE | Admit: 2021-12-17 | Discharge: 2021-12-17 | Disposition: A | Discharge status: Home / Self Care | Payer: Medicaid Other | Source: Ambulatory Visit | Attending: Radiation Oncology | Admitting: Radiation Oncology

## 2021-12-17 ENCOUNTER — Encounter: Payer: Self-pay | Admitting: Radiation Oncology

## 2021-12-17 ENCOUNTER — Other Ambulatory Visit: Payer: Self-pay

## 2021-12-17 VITALS — BP 152/110 | HR 102 | Temp 97.2°F | Resp 20 | Ht 64.0 in

## 2021-12-17 DIAGNOSIS — B379 Candidiasis, unspecified: Secondary | ICD-10-CM | POA: Diagnosis not present

## 2021-12-17 DIAGNOSIS — C541 Malignant neoplasm of endometrium: Secondary | ICD-10-CM

## 2021-12-17 DIAGNOSIS — R140 Abdominal distension (gaseous): Secondary | ICD-10-CM | POA: Diagnosis not present

## 2021-12-17 DIAGNOSIS — Z79899 Other long term (current) drug therapy: Secondary | ICD-10-CM | POA: Insufficient documentation

## 2021-12-17 DIAGNOSIS — Z8542 Personal history of malignant neoplasm of other parts of uterus: Secondary | ICD-10-CM | POA: Diagnosis not present

## 2021-12-17 DIAGNOSIS — Z923 Personal history of irradiation: Secondary | ICD-10-CM | POA: Diagnosis not present

## 2021-12-17 MED ORDER — FLUCONAZOLE 150 MG PO TABS: 150.0000 mg | ORAL_TABLET | Freq: Every day | ORAL | 0 refills | Status: DC

## 2022-01-14 ENCOUNTER — Ambulatory Visit: Payer: Medicaid Other | Admitting: Gastroenterology

## 2022-03-26 ENCOUNTER — Other Ambulatory Visit: Payer: Self-pay | Admitting: Gynecologic Oncology

## 2022-03-26 ENCOUNTER — Telehealth: Payer: Self-pay | Admitting: *Deleted

## 2022-03-26 ENCOUNTER — Encounter: Payer: Self-pay | Admitting: Gynecologic Oncology

## 2022-03-26 DIAGNOSIS — N898 Other specified noninflammatory disorders of vagina: Secondary | ICD-10-CM

## 2022-03-26 MED ORDER — FLUCONAZOLE 150 MG PO TABS: 150.0000 mg | ORAL_TABLET | Freq: Once | ORAL | 0 refills | Status: AC

## 2022-03-26 NOTE — Telephone Encounter (Signed)
Spoke with pt to inform her that a new prescription for Diflucan has been put in for her at the [facility] on [address]. Pt stated she's going go to get her daughter to take her today to pick it up and start taking it. She verbalized understanding.

## 2022-03-26 NOTE — Telephone Encounter (Signed)
Spoke with pt today to inform her of her upcoming appt and update her chart. Pt stated that she has been experiencing thick cloudy vaginal discharge that has a fishy odor. She denies any fever or chills. She was not aware of the diflucan order place by Dr.Kinard for pick up for the same symptoms so she never picked it up. Sarah John, NP notified.

## 2022-04-01 ENCOUNTER — Other Ambulatory Visit: Payer: Self-pay

## 2022-04-01 ENCOUNTER — Inpatient Hospital Stay: Payer: Medicaid Other | Attending: Gynecologic Oncology | Admitting: Gynecologic Oncology

## 2022-04-01 ENCOUNTER — Encounter: Payer: Self-pay | Admitting: Gynecologic Oncology

## 2022-04-01 VITALS — BP 121/83 | HR 85 | Temp 97.3°F | Resp 18 | Ht 64.0 in | Wt 268.0 lb

## 2022-04-01 DIAGNOSIS — N898 Other specified noninflammatory disorders of vagina: Secondary | ICD-10-CM | POA: Insufficient documentation

## 2022-04-01 DIAGNOSIS — K5900 Constipation, unspecified: Secondary | ICD-10-CM | POA: Diagnosis not present

## 2022-04-01 DIAGNOSIS — K5909 Other constipation: Secondary | ICD-10-CM

## 2022-04-01 DIAGNOSIS — Z8542 Personal history of malignant neoplasm of other parts of uterus: Secondary | ICD-10-CM | POA: Diagnosis present

## 2022-04-01 DIAGNOSIS — Z90722 Acquired absence of ovaries, bilateral: Secondary | ICD-10-CM | POA: Insufficient documentation

## 2022-04-01 DIAGNOSIS — Z923 Personal history of irradiation: Secondary | ICD-10-CM | POA: Insufficient documentation

## 2022-04-01 DIAGNOSIS — Z90710 Acquired absence of both cervix and uterus: Secondary | ICD-10-CM | POA: Diagnosis not present

## 2022-04-01 DIAGNOSIS — C541 Malignant neoplasm of endometrium: Secondary | ICD-10-CM

## 2022-04-01 NOTE — Patient Instructions (Addendum)
It was good to see you today.  I do not see or feel any evidence of cancer recurrence on your exam.  Please increase your regimen for constipation.  You can either increase how much milk of magnesia you are taking or add something like MiraLAX.  I will plan to see you in 6 months, after your 3 month follow-up with Dr. Sondra Come.  Please call sometime after September to get a visit scheduled with me in December as might clinic schedule is only out through the end of September at this time.  At your next visit with me in December, you will be 2 years out from your original surgery.  If you remain without any evidence of cancer recurrence at that time, we will transition to visits every 6 months, still alternating between our clinic and radiation oncology.  As always, please call if you develop any new and concerning symptoms between visits.

## 2022-04-01 NOTE — Progress Notes (Signed)
Gynecologic Oncology Return Clinic Visit  04/01/22  Reason for Visit: surveillance visit in the setting of HIR endometrial cancer  Treatment History: Her symptoms began approximately 10 years prior to diagnosis with abnormal uterine bleeding.  [name]  When her symptoms escalated with respect to the volume of bleeding she sought evaluation with Dr. Elly Modena on 06/22/2020.  Work-up of symptoms included a transvaginal ultrasound scan and endometrial biopsy. Transvaginal US on June 22, 2020 showed a uterus measuring 14.9 x 12.3 x 10.5 cm.  There were multiple fibroids within the uterus including a partially exophytic posterior uterine wall fibroid measuring 8 cm.  The endometrium was thick at 19 mm.  The left and right ovaries were not seen. 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    On 08/29/20 she underwent robotic assisted total hysterectomy for uterus greater than 250 g, [name], mini laparotomy for specimen delivery. Intraoperative findings were significant for morbid obesity with a BMI of 45 kg meters squared, a 20 cm uterus spanning sidewall to sidewall with limited sidewall visualization and exposure.  Normal ovaries bilaterally.  The uterus weighed 1300 g in the operating room.  Sentinel lymph node mapping was unable to be performed due to extreme abdominal obesity and the large bulky uterus preventing visualization of the sidewalls. Surgery was challenging due to body habitus but uncomplicated.  She required mini laparotomy for specimen delivery.  Final pathology revealed a grade 2 endometrioid endometrial adenocarcinoma with 2 mm of 41 mm myometrial invasion (inner half).  LVSI was present but focal.  The adnexa and cervix were negative for metastatic carcinoma. MMR intact, MSI stable.    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 [name]   [name], [name], no evidence of recurrent cancer.  Interval History: She saw Dr. Sondra Come on 2/27.  Reports that she had continued to have some vaginal discharge with an odor.  She has had vaginal discharge since prior to her surgery.  A prescription for treatment of candidiasis had been sent in at the time of her last radiation oncology appointment.  When she reached out to my office last week, she was encouraged to pick up this prescription.  She notes taking medication starting on Saturday and has had some improvement in her vaginal discharge and denies any odor.  She denies any vaginal bleeding.  She reports intermittent dysuria since radiation, denies any current urinary symptoms.  Denies any pelvic or abdominal pain.  Continues to have constipation, has bowel movements every 2 days.  Reports occasional rectal pain with bowel movements.   Past Medical/Surgical History: Past Medical History:  Diagnosis Date   Anemia    Anxiety    Asthma    Bipolar disorder ([place])    Depression    Essential hypertension 05/26/2017   GERD (gastroesophageal reflux disease)    History of radiation therapy 10/18/2020-11/22/2020   vaginal brachytherapy Endometrium; Dr. 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[place])    Obesity    OSA (obstructive sleep apnea)    Pneumonia  history of   Restless leg syndrome    Stroke [facility])    TIA (transient ischemic attack)    Uterine cancer [facility])    endometrial    Past Surgical History:  Procedure Laterality Date   ABDOMINAL HYSTERECTOMY  08/2020   BREAST LUMPECTOMY Left     CESAREAN SECTION     x3   ROBOTIC ASSISTED TOTAL HYSTERECTOMY WITH BILATERAL SALPINGO OOPHERECTOMY N/A 08/29/2020   Procedure: XI ROBOTIC ASSISTED TOTAL HYSTERECTOMY WITH BILATERAL SALPINGO OOPHORECTOMY, GREATER THAN 250 GRAMS, MINI LAPAROTOMY;  [name], MD;  [name];  Service: Gynecology;  [name];   TUBAL LIGATION Bilateral 1990    Family History  Problem Relation Age of Onset   CAD Mother 65   Seizures Mother    Other Father 76       struck by lightning   Seizures Brother    Cancer Maternal Aunt        lung   Cancer Maternal Grandmother        breast   Cancer Paternal Grandmother        cervical   Multiple sclerosis Daughter    Autism Son    Stroke Neg Hx     Social History   Socioeconomic History   Marital status: Legally Separated    Spouse name: Not on file   Number of children: Not on file   Years of education: Not on file   Highest education level: Not on file  Occupational History   Occupation: disabled  Tobacco Use   Smoking status: Never   Smokeless tobacco: Never  Vaping Use   Vaping Use: Never used  Substance and Sexual Activity   Alcohol use: Not Currently    Comment: very seldom   Drug use: Never   Sexual activity: Not Currently    Partners: Male    Birth control/protection: None, [name], [name], [name], [name], Rfl: 0   GAVILAX 17 GM/SCOOP  powder, Take by mouth., [name] , Rfl:    ibuprofen (ADVIL) 600 MG tablet, Take 1 tablet (600 mg total) by mouth every 6 (six) hours as needed., [name], [name], [name], [name] , [name], [name], [name] , [name], [name], [name] , [name], [name] , [name] , Rfl:    gabapentin (NEURONTIN) 300 MG capsule, Take 300 mg by mouth daily., [name] , Rfl:    silver sulfADIAZINE (SILVADENE) 1 % cream, Apply 1 application topically daily. (Patient not taking: Reported on 12/17/2021), [name] 50 g, Rfl: 0  Review of Systems: + Fatigue, vision changes,  shortness of breath, wheezing, dyspareunia, intermittent dysuria, vaginal discharge. Denies appetite changes, fevers, chills, unexplained weight changes. Denies hearing loss, neck lumps or masses, mouth sores, ringing in ears or voice changes. Denies cough. Denies chest pain or palpitations. Denies leg swelling. Denies abdominal distention, pain, blood in stools, constipation, diarrhea, nausea, vomiting, or early satiety. Denies frequency, hematuria or incontinence. Denies hot flashes, pelvic pain, vaginal bleeding.   Denies joint pain, back pain or muscle pain/cramps. Denies itching, rash, or wounds. Denies dizziness, headaches, numbness or seizures. Denies swollen lymph nodes or glands, denies easy bruising or bleeding. Denies anxiety, depression, confusion, or decreased concentration.  Physical Exam: BP 121/83 (BP Location: Left Arm, Patient Position: Sitting)   Pulse 85   Temp (!) 97.3 F (36.3 C) (Tympanic)   Resp 18   Ht 5' 4"  (1.626 m)   Wt 268 lb (121.6 kg)   LMP 08/10/2020 Comment: urine preg.pending.  08/29/20  SpO2 96%   BMI 46.00 kg/m  General: Alert, oriented, no acute  distress. HEENT: Normocephalic, atraumatic, sclera anicteric. Chest: Clear to auscultation bilaterally.  No wheezes or rhonchi. Cardiovascular: Regular rate and rhythm, no murmurs. Abdomen: Obese, soft, nontender.  Normoactive bowel sounds.  No masses or hepatosplenomegaly appreciated.  Well-healed incisions. Extremities: Grossly normal range of motion.  Warm, well perfused.  No edema bilaterally.  There is hypopigmentation of the skin along an area of the right lateral thigh.  No significant pain with palpation over this area. Skin: Noted above. Lymphatics: No cervical, supraclavicular, or inguinal adenopathy. GU: Normal appearing external genitalia without erythema, excoriation, or lesions.  Speculum exam reveals well rugated redundant vaginal mucosa, cuff intact, no lesions or masses, no bleeding or discharge appreciated.  No odor appreciated.  Bimanual exam reveals cuff intact, no nodularity or masses.  Rectovaginal exam confirmed these findings.  Patient noted to have multiple small hard fecal balls within her rectum.  Laboratory & Radiologic Studies: None new  Assessment & Plan: MARRIANA HIBBERD is a 56 y.o. 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 MMR normal/MSI stable. High/intermediate risk factors for recurrence. S/p vaginal brachytherapy completed 11/22/20.   Patient is NED on exam today.    We discussed increasing bowel regimen for constipation.  I asked her to call the office if she has recurrence of her vaginal discharge symptoms.  She has previously been recommended to try boric acid suppositories for overgrowth of vaginal flora.   Per NCCN surveillance recommendations, we will continue with surveillance visits every 3 months alternating between my office and radiation oncology. Pap smear is not recommended in routine endometrial cancer surveillance. At 2 years after completion of adjuvant treatment, we will  space these visits to every 6 months, and then annually if recurrence has not developed within 5 years.   28 minutes of total time was spent for this patient encounter, including preparation, face-to-face counseling with the patient and coordination of care, and documentation of the encounter.  Jeral Pinch, MD  Division of Gynecologic Oncology  Department of Obstetrics and Gynecology  [facility]

## 2022-04-04 ENCOUNTER — Other Ambulatory Visit: Payer: Self-pay | Admitting: Nurse Practitioner

## 2022-04-04 DIAGNOSIS — Z1231 Encounter for screening mammogram for malignant neoplasm of breast: Secondary | ICD-10-CM

## 2022-05-26 ENCOUNTER — Other Ambulatory Visit: Payer: Self-pay

## 2022-05-26 ENCOUNTER — Emergency Department (HOSPITAL_COMMUNITY): Payer: Medicaid Other

## 2022-05-26 ENCOUNTER — Emergency Department (HOSPITAL_COMMUNITY)
Admission: EM | Admit: 2022-05-26 | Discharge: 2022-05-26 | Disposition: A | Discharge status: Home / Self Care | Payer: Medicaid Other | Attending: Emergency Medicine | Admitting: Emergency Medicine

## 2022-05-26 ENCOUNTER — Encounter (HOSPITAL_COMMUNITY): Payer: Self-pay

## 2022-05-26 DIAGNOSIS — M5431 Sciatica, right side: Secondary | ICD-10-CM | POA: Diagnosis not present

## 2022-05-26 DIAGNOSIS — M545 Low back pain, unspecified: Secondary | ICD-10-CM | POA: Diagnosis present

## 2022-05-26 DIAGNOSIS — Z79899 Other long term (current) drug therapy: Secondary | ICD-10-CM | POA: Diagnosis not present

## 2022-05-26 DIAGNOSIS — I10 Essential (primary) hypertension: Secondary | ICD-10-CM | POA: Diagnosis not present

## 2022-05-26 DIAGNOSIS — Z8673 Personal history of transient ischemic attack (TIA), and cerebral infarction without residual deficits: Secondary | ICD-10-CM | POA: Diagnosis not present

## 2022-05-26 MED ORDER — NAPROXEN 375 MG PO TABS: 375.0000 mg | ORAL_TABLET | Freq: Two times a day (BID) | ORAL | 0 refills | Status: DC

## 2022-05-26 MED ORDER — GABAPENTIN 300 MG PO CAPS
300.0000 mg | ORAL_CAPSULE | Freq: Once | ORAL | Status: AC
  Administered 2022-05-26: 300 mg via ORAL
  Filled 2022-05-26: qty 1

## 2022-05-26 MED ORDER — METHYLPREDNISOLONE 4 MG PO TBPK: ORAL_TABLET | ORAL | 0 refills | Status: DC

## 2022-05-26 MED ORDER — METHOCARBAMOL 500 MG PO TABS
500.0000 mg | ORAL_TABLET | Freq: Once | ORAL | Status: AC
  Administered 2022-05-26: 500 mg via ORAL
  Filled 2022-05-26: qty 1

## 2022-05-26 MED ORDER — LIDOCAINE 5 % EX PTCH
1.0000 | MEDICATED_PATCH | Freq: Once | CUTANEOUS | Status: DC
  Administered 2022-05-26: 1 via TRANSDERMAL
  Filled 2022-05-26: qty 1

## 2022-05-26 MED ORDER — PREDNISONE 20 MG PO TABS
60.0000 mg | ORAL_TABLET | Freq: Once | ORAL | Status: AC
  Administered 2022-05-26: 60 mg via ORAL
  Filled 2022-05-26: qty 3

## 2022-05-26 MED ORDER — METHOCARBAMOL 500 MG PO TABS: 500.0000 mg | ORAL_TABLET | Freq: Two times a day (BID) | ORAL | 0 refills | Status: DC

## 2022-05-26 MED ORDER — KETOROLAC TROMETHAMINE 30 MG/ML IJ SOLN
15.0000 mg | Freq: Once | INTRAMUSCULAR | Status: AC
  Administered 2022-05-26: 15 mg via INTRAMUSCULAR
  Filled 2022-05-26: qty 1

## 2022-05-26 NOTE — ED Provider Notes (Signed)
[facility] EMERGENCY DEPARTMENT Provider Note   CSN: [id number] Arrival date & time: 05/26/22  1043     History  Chief Complaint  Patient presents with   Hip Pain    Sarah Bean is a 56 y.o. female.  Sarah Bean is a 56 y.o. female with history of hypertension, stroke, anxiety, depression, RLS, who presents to the emergency department for evaluation of pain in the low back and right hip.  Patient reports pain has been going on for at least 2 weeks.  No inciting fall or injury.  No recent strenuous activity.  She reports pain seems to start in the buttock and hip but radiates into her thigh.  Occasionally has more mild but similar pain on the left.  Has had some tingling but no persistent weakness or numbness.  No loss of bowel or bladder control or saddle anesthesia.  No associated abdominal pain.  No dysuria or other urinary symptoms.  No fevers or chills.  Patient does have a history of endometrial cancer.  No history of IV drug use.  The history is provided by the patient, medical records and a significant other.  Hip Pain Pertinent negatives include no chest pain, no abdominal pain and no shortness of breath.       Home Medications Prior to Admission medications   Medication Sig Start Date End Date Taking? Authorizing Provider  methocarbamol (ROBAXIN) 500 MG tablet Take 1 tablet (500 mg total) by mouth 2 (two) times daily. 05/26/22  Yes Jacqlyn Larsen, PA-C  methylPREDNISolone (MEDROL DOSEPAK) 4 MG TBPK tablet Take as directed 05/26/22  Yes Jacqlyn Larsen, PA-C  naproxen (NAPROSYN) 375 MG tablet Take 1 tablet (375 mg total) by mouth 2 (two) times daily. 05/26/22  Yes Jacqlyn Larsen, PA-C  albuterol (VENTOLIN HFA) 108 (90 Base) MCG/ACT inhaler [name] 07/28/20   Henderly, Britni A, PA-C  gabapentin (NEURONTIN) 300 MG capsule Take 300 mg by mouth daily. 12/26/21   [provider]  GAVILAX 17 GM/SCOOP powder Take by mouth. 01/21/21   [provider]  ibuprofen (ADVIL) 600 MG tablet Take 1 tablet (600 mg total) by mouth every 6 (six) hours as needed. 09/14/21   Melynda Ripple, MD  lisinopril-hydrochlorothiazide (ZESTORETIC) 20-25 MG tablet [name] 06/06/20   [provider]  QUEtiapine (SEROQUEL) 100 MG tablet [name]    [provider]  sertraline (ZOLOFT) 100 MG tablet [name] 06/09/20   [provider]  silver sulfADIAZINE (SILVADENE) 1 % cream Apply 1 application topically daily. Patient not taking: Reported on 12/17/2021 09/14/21   Melynda Ripple, MD  traZODone (DESYREL) 100 MG tablet [name]     [provider]      Allergies    Patient has no known allergies.    Review of Systems   Review of Systems  Constitutional:  Negative for chills and fever.  HENT: Negative.    Respiratory:  Negative for shortness of breath.   Cardiovascular:  Negative for chest pain.  Gastrointestinal:  Negative for abdominal pain, constipation, diarrhea, nausea and vomiting.  Genitourinary:  Negative for dysuria, flank pain, frequency and hematuria.  Musculoskeletal:  Positive for back pain. Negative for arthralgias, gait problem, joint swelling, myalgias and neck pain.  Skin:  Negative  for color change, rash and wound.  Neurological:  Negative for weakness and numbness.    Physical Exam Updated Vital Signs BP 107/75 (BP Location: Left Arm)   Pulse 84   Temp 98.1 F (36.7 C) (Oral)   Resp 18   Ht '5\' 4"'$  (1.626 m)   Wt 129.7 kg   LMP 08/10/2020 Comment: urine preg.pending.  08/29/20  SpO2 95%   BMI 49.09 kg/m  Physical Exam Vitals and nursing note reviewed.  Constitutional:      General: She is not in acute distress.    Appearance: Normal appearance. She is well-developed. She is not ill-appearing or diaphoretic.  HENT:      Head: Atraumatic.  Eyes:     General:        Right eye: No discharge.        Left eye: No discharge.  Cardiovascular:     Pulses:          Radial pulses are 2+ on the right side and 2+ on the left side.       Dorsalis pedis pulses are 2+ on the right side and 2+ on the left side.       Posterior tibial pulses are 2+ on the right side and 2+ on the left side.  Pulmonary:     Effort: Pulmonary effort is normal. No respiratory distress.  Abdominal:     General: Bowel sounds are normal. There is no distension.     Palpations: Abdomen is soft. There is no mass.     Tenderness: There is no abdominal tenderness. There is no guarding.     Comments: Abdomen soft, nondistended, nontender to palpation in all quadrants without guarding or peritoneal signs, no CVA tenderness bilaterally  Musculoskeletal:     Cervical back: Neck supple.     Comments: Tenderness to palpation over right low back buttock, no overlying skin changes.  Pain made worse with range of motion of the lower extremities, positive straight leg raise on the right.  Skin:    General: Skin is warm and dry.     Capillary Refill: Capillary refill takes less than 2 seconds.  Neurological:     Mental Status: She is alert and oriented to person, place, and time.     Comments: Alert, clear speech, following commands. Moving all extremities without difficulty. Bilateral lower extremities with 5/5 strength in proximal and distal muscle groups and with dorsi and plantar flexion. Sensation intact in bilateral lower extremities. 2+ patellar DTRs bilaterally. Ambulatory with steady gait  Psychiatric:        Behavior: Behavior normal.     ED Results / Procedures / Treatments   Labs (all labs ordered are listed, but only abnormal results are displayed) Labs Reviewed - No data to display  EKG None  Radiology DG Hip Unilat With Pelvis 2-3 Views Right  Result Date: 05/26/2022 CLINICAL DATA:  low back AND hip pain EXAM: DG HIP  (WITH OR WITHOUT PELVIS) 2-3V RIGHT COMPARISON:  None Available. FINDINGS: No acute fracture or dislocation. Hip joint spaces and alignment are maintained. Lower lumbar facet arthropathy. No area of erosion or osseous destruction. No unexpected radiopaque foreign body. Soft tissues are unremarkable. IMPRESSION: No acute fracture or dislocation. If persistent concern for nondisplaced hip or pelvic fracture, recommend dedicated pelvic MRI. Electronically Signed   By: Valentino Saxon M.D.   On: 05/26/2022 12:11   DG Lumbar Spine Complete  Result Date: 05/26/2022 CLINICAL DATA:  low back AND hip pain EXAM:  LUMBAR SPINE - COMPLETE 4+ VIEW COMPARISON:  CT dated January 21, 2021 FINDINGS: There are five non-rib bearing lumbar-type vertebral bodies. There is normal alignment. There is no evidence for acute fracture or subluxation. Intervertebral disc spaces are preserved without significant degenerative changes. There is predominately lower lumbar facet arthropathy. Visualized abdomen is unremarkable. IMPRESSION: There is lower lumbar facet arthropathy. Disc spaces are relatively preserved. Electronically Signed   By: Valentino Saxon M.D.   On: 05/26/2022 12:10    Procedures Procedures    Medications Ordered in ED Medications  lidocaine (LIDODERM) 5 % 1 patch (1 patch Transdermal Patch Applied 05/26/22 1205)  ketorolac (TORADOL) 30 MG/ML injection 15 mg (15 mg Intramuscular Given 05/26/22 1206)  predniSONE (DELTASONE) tablet 60 mg (60 mg Oral Given 05/26/22 1207)  gabapentin (NEURONTIN) capsule 300 mg (300 mg Oral Given 05/26/22 1207)  methocarbamol (ROBAXIN) tablet 500 mg (500 mg Oral Given 05/26/22 1207)    ED Course/ Medical Decision Making/ A&P                           Medical Decision Making  Patient presents to the ED with complaints of  back pain.  Nontoxic, vitals WNL.   Additional history obtained:  Additional history obtained from chart review & nursing note review.   Imaging Studies  ordered:  I ordered imaging studies which included x-rays of the lumbar spine and right hip, I independently reviewed, formal radiology impression shows:  No fracture deformity, disc spaces preserved  ED Course:  Patient has no back pain red flags.  No neurologic deficits, ambulatory- doubt cauda equina syndrome or acute cord compression. Afebrile, no hx of IVDU- doubt epidural abscess. No urinary sxs- feel that UTI/nephrolithiasis. Symmetric pulses, not a tearing sensation, overall well appearing, feel that dissection is unlikely at this time. Favor musculoskeletal pain- likely sciatica or strain/spasm. Will treat with Naproxen and Robaxin, discussed with patient that they are not to drive or operate heavy machinery while taking Robaxin. I discussed treatment plan, need for PCP follow-up, and return precautions with the patient. Provided opportunity for questions, patient confirmed understanding and is in agreement with plan.   Portions of this note were generated with [employer]. Dictation errors may occur despite best attempts at proofreading.          Final Clinical Impression(s) / ED Diagnoses Final diagnoses:  Right sided sciatica    Rx / DC Orders ED Discharge Orders          Ordered    methocarbamol (ROBAXIN) 500 MG tablet  2 times daily        05/26/22 1228    methylPREDNISolone (MEDROL DOSEPAK) 4 MG TBPK tablet        05/26/22 1228    naproxen (NAPROSYN) 375 MG tablet  2 times daily        05/26/22 1228              Jacqlyn Larsen, [place] 05/26/22 1255    Daleen Bo, MD 05/26/22 2040

## 2022-05-26 NOTE — ED Triage Notes (Signed)
Reports right hip.leg pain that started over two weeks ago.

## 2022-05-26 NOTE — ED Notes (Signed)
Pt transported to Xray. 

## 2022-05-26 NOTE — Discharge Instructions (Addendum)
You were seen here today for Back Pain: Low back pain is discomfort in the lower back that may be due to injuries to muscles and ligaments around the spine. Occasionally, it may be caused by a problem to a part of the spine called a disc. Your back pain should be treated with medicines listed below as well as back exercises and this back pain should get better over the next 2 weeks. Most patients get completely well in 4 weeks. It is important to know however, if you develop severe or worsening pain, low back pain with fever, numbness, weakness or inability to walk or urinate, you should return to the ER immediately.  Please follow up with your doctor this week for a recheck if still having symptoms.  HOME INSTRUCTIONS Self - care:  The application of heat can help soothe the pain.  Maintaining your daily activities, including walking (this is encouraged), as it will help you get better faster than just staying in bed. Do not life, push, pull anything more than 10 pounds for the next week. I am attaching back exercises that you can do at home to help facilitate your recovery.   Back Exercises - I have attached a handout on back exercises that can be done at home to help facilitate your recovery.   Medications are also useful to help with pain control.   Acetaminophen.  This medication is generally safe, and found over the counter. Take as directed for your age. You should not take more than 8 of the extra strength ('500mg'$ ) pills a day (max dose is '4000mg'$  total OVER one day)  Non steroidal anti inflammatory: This includes medications including Ibuprofen, naproxen and Mobic; These medications help both pain and swelling and are very useful in treating back pain.  They should be taken with food, as they can cause stomach upset, and more seriously, stomach bleeding. Do not combine the medications.   Lidocaine Patch: Salon Pas lidocaine patches (blue and silver box) can be purchased over the counter and worn  for 12 hours for local pain relief   Muscle relaxants:  These medications can help with muscle tightness that is a cause of lower back pain.  Most of these medications can cause drowsiness, and it is not safe to drive or use dangerous machinery while taking them. They are primarily helpful when taken at night before sleep.  Prednisone -  This is an oral steroid.  This medication is best taken with food in the morning.  Please note that this medication can cause anxiety, mood swings, muscle fatigue, increased hunger, weight gain (sodium/fluid retention), poor sleep as well as other symptoms. If you are a diabetic, please monitor your blood sugars at home as this medication can increase your blood sugars. Call your pharmacist if you have any questions.  You will need to follow up with your primary healthcare provider  or the Orthopedist in 1-2 weeks for reassessment and persistent symptoms.  Be aware that if you develop new symptoms, such as a fever, leg weakness, difficulty with or loss of control of your urine or bowels, abdominal pain, or more severe pain, you will need to seek medical attention and/or return to the Emergency department. Additional Information:  Your vital signs today were: BP 107/75 (BP Location: Left Arm)   Pulse 84   Temp 98.1 F (36.7 C) (Oral)   Resp 18   Ht '5\' 4"'$  (1.626 m)   Wt 129.7 kg   LMP 08/10/2020 Comment: urine  preg.pending.  08/29/20  SpO2 95%   BMI 49.09 kg/m  If your blood pressure (BP) was elevated above 135/85 this visit, please have this repeated by your doctor within one month. ---------------

## 2022-06-11 ENCOUNTER — Telehealth: Payer: Self-pay | Admitting: *Deleted

## 2022-06-11 NOTE — Telephone Encounter (Signed)
Returned patient's phone call, lvm for a return call 

## 2022-06-17 ENCOUNTER — Ambulatory Visit: Payer: Medicaid Other | Admitting: Radiation Oncology

## 2022-07-13 ENCOUNTER — Ambulatory Visit (HOSPITAL_COMMUNITY)
Admission: EM | Admit: 2022-07-13 | Discharge: 2022-07-13 | Disposition: A | Discharge status: Home / Self Care | Payer: Medicaid Other | Attending: Family Medicine | Admitting: Family Medicine

## 2022-07-13 ENCOUNTER — Encounter (HOSPITAL_COMMUNITY): Payer: Self-pay | Admitting: *Deleted

## 2022-07-13 ENCOUNTER — Other Ambulatory Visit: Payer: Self-pay

## 2022-07-13 DIAGNOSIS — M5431 Sciatica, right side: Secondary | ICD-10-CM

## 2022-07-13 DIAGNOSIS — M5432 Sciatica, left side: Secondary | ICD-10-CM | POA: Diagnosis not present

## 2022-07-13 MED ORDER — DEXAMETHASONE 10 MG/ML FOR PEDIATRIC ORAL USE
INTRAMUSCULAR | Status: AC
  Filled 2022-07-13: qty 1

## 2022-07-13 MED ORDER — KETOROLAC TROMETHAMINE 60 MG/2ML IM SOLN
60.0000 mg | Freq: Once | INTRAMUSCULAR | Status: AC
  Administered 2022-07-13: 60 mg via INTRAMUSCULAR

## 2022-07-13 MED ORDER — TRAMADOL HCL 50 MG PO TABS: 50.0000 mg | ORAL_TABLET | Freq: Four times a day (QID) | ORAL | 0 refills | Status: DC | PRN

## 2022-07-13 MED ORDER — DEXAMETHASONE SODIUM PHOSPHATE 10 MG/ML IJ SOLN
10.0000 mg | Freq: Once | INTRAMUSCULAR | Status: AC
  Administered 2022-07-13: 10 mg via INTRAMUSCULAR

## 2022-07-13 MED ORDER — KETOROLAC TROMETHAMINE 60 MG/2ML IM SOLN
INTRAMUSCULAR | Status: AC
  Filled 2022-07-13: qty 2

## 2022-07-13 NOTE — ED Triage Notes (Addendum)
Pt reports she can not walk due to back and leg pain . Pt was last seen in August 2023 for same pain. Pt ambulatory to room with out assistance.

## 2022-07-13 NOTE — Discharge Instructions (Addendum)
Meds ordered this encounter  Medications   traMADol (ULTRAM) 50 MG tablet    Sig: Take 1 tablet (50 mg total) by mouth every 6 (six) hours as needed.    Dispense:  15 tablet    Refill:  0   ketorolac (TORADOL) injection 60 mg   dexamethasone (DECADRON) injection 10 mg

## 2022-07-15 NOTE — ED Provider Notes (Addendum)
[place]   [id number] 07/13/22 Arrival Time: 7893  ASSESSMENT & PLAN:  1. Bilateral sciatica   Acute on chronic. Able to ambulate here and hemodynamically stable. No indication for imaging of back at this time given no trauma and normal neurological exam. Discussed.  Meds ordered this encounter  Medications   traMADol (ULTRAM) 50 MG tablet    Sig: Take 1 tablet (50 mg total) by mouth every 6 (six) hours as needed.    Dispense:  15 tablet    Refill:  0   ketorolac (TORADOL) injection 60 mg   dexamethasone (DECADRON) injection 10 mg   Medication sedation precautions given. Encourage ROM/movement as tolerated.  Recommend:  Follow-up Information     Schedule an appointment as soon as possible for a visit  with [facility].   Specialty: Specialist Contact information: [address] [place] [address] [phone]         [place].   Specialty: Emergency Medicine Why: If symptoms worsen in any way. Contact information: [address] [id number] [place] [phone]                Reviewed expectations re: course of current medical issues. Questions answered. Outlined signs and symptoms indicating need for more acute intervention. Patient verbalized understanding. After Visit Summary given.   SUBJECTIVE: History from: patient. Sarah Bean is a 56 y.o. female who presents with complaint of bilateral lower back pain that radiates down legs; on and off for months; chronic issue now. No specific extremity sensation changes or weakness. Is ambulatory here today "but it hurts to walk". Normal bowel/bladder habits. OTC analgesics without much help.  Reports no chronic steroid use, fevers, IV drug use, or recent back surgeries or procedures.   OBJECTIVE:  Vitals:   07/13/22 1117  BP: (!) 136/96  Pulse: 75  Resp: 20  Temp: 97.8 F (36.6 C)  SpO2:  92%    General appearance: alert; no distress HEENT: [place]; AT Neck: supple with FROM; without midline tenderness CV: regular Lungs: unlabored respirations; speaks full sentences without difficulty Abdomen: soft, non-tender; non-distended Back: mild  and poorly localized tenderness to palpation over bilateral paraspinal lumbar musculature ; FROM at waist; bruising: none; without midline tenderness Extremities: without edema; symmetrical without gross deformities; normal ROM of bilateral LE Skin: warm and dry Neurologic: normal gait; normal sensation and strength of bilateral LE Psychological: alert and cooperative; normal mood and affect   No Known Allergies  Past Medical History:  Diagnosis Date   Anemia    Anxiety    Asthma    Bipolar disorder (HCC)    Depression    Essential hypertension 05/26/2017   GERD (gastroesophageal reflux disease)    History of radiation therapy 10/18/2020-11/22/2020   vaginal brachytherapy Endometrium; Dr. 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[place])    Obesity    OSA (obstructive sleep apnea)    Pneumonia    history of   Restless leg syndrome    Stroke (HCC)    TIA (transient ischemic attack)    Uterine cancer ([place])    endometrial   Social History   Socioeconomic History   Marital status: Legally Separated    Spouse name: Not on file   Number of children: Not on file   Years of education: Not on file   Highest education level: Not on file  Occupational History   Occupation: disabled  Tobacco Use  Smoking status: Never   Smokeless tobacco: Never  Vaping Use   Vaping Use: Never used  Substance and Sexual Activity   Alcohol use: Not Currently    Comment: very seldom   Drug use: Never   Sexual activity: Not Currently    Partners: Male    Birth control/protection: None, [name], who has autism. He vocalizes and acts out.    Social Determinants of Health   Financial Resource Strain: Not on file  Food Insecurity: Not on file  Transportation Needs: Not on file  Physical Activity: Not on file  Stress: Not on file  Social Connections: Not on file  Intimate Partner Violence: Not on file   Family History  Problem Relation Age of Onset   CAD Mother 72   Seizures Mother    Other Father 88       struck by lightning   Seizures Brother    Cancer Maternal Aunt        lung   Cancer Maternal Grandmother        breast   Cancer Paternal Grandmother        cervical   Multiple sclerosis Daughter    Autism Son    Stroke Neg Hx    Past Surgical History:  Procedure Laterality Date   ABDOMINAL HYSTERECTOMY  08/2020   BREAST LUMPECTOMY Left    CESAREAN SECTION     x3   ROBOTIC ASSISTED TOTAL HYSTERECTOMY WITH BILATERAL SALPINGO OOPHERECTOMY N/A 08/29/2020   Procedure: XI ROBOTIC ASSISTED TOTAL HYSTERECTOMY WITH BILATERAL SALPINGO OOPHORECTOMY, GREATER THAN 250 GRAMS, MINI LAPAROTOMY;  [name], MD;  [name];  Service: Gynecology;  [name];   TUBAL LIGATION Bilateral 1990      Vanessa Kick, MD 07/15/22 [place], MD 07/15/22 1140

## 2022-07-24 ENCOUNTER — Other Ambulatory Visit: Payer: Self-pay | Admitting: Family

## 2022-07-26 ENCOUNTER — Telehealth: Payer: Self-pay | Admitting: Oncology

## 2022-07-26 NOTE — Telephone Encounter (Signed)
Sarah Bean left a message to reschedule her follow up on 07/29/22 with Dr. Sondra Come because she has to go to a funeral.  Moved appointment to 08/05/22 at 3:30 and left her a message with new appointment details.

## 2022-07-29 ENCOUNTER — Ambulatory Visit: Payer: Medicaid Other | Admitting: Radiation Oncology

## 2022-08-05 ENCOUNTER — Ambulatory Visit
Admission: RE | Admit: 2022-08-05 | Discharge: 2022-08-05 | Disposition: A | Discharge status: Home / Self Care | Payer: Medicaid Other | Source: Ambulatory Visit | Attending: Radiation Oncology | Admitting: Radiation Oncology

## 2022-08-05 NOTE — Progress Notes (Incomplete)
Radiation Oncology         [phone] ________________________________  Name: Sarah Bean MRN: [id number]  Date: 08/05/2022  DOB: 1966-07-21  Follow-Up Visit Note  CC: [facility]  No diagnosis found.  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 She was last seen here for follow-up on 12/17/21. Since her last visit, the patient followed up with Dr. Berline Lopes on 04/01/22. During which time, the patient reported ongoing odorous vaginal discharge. To review, she was sent in a prescription for candidiasis during her last follow-up appointment and February. The patient called Dr. Charisse March office the week prior to her visit and was instructed to start taking candidiasis again. Since taking the medication, she reported some improvement in her vaginal discharge and denied any more odor. Otherwise, the patient denied any symptoms concerning for disease recurrence and was noted as NED on examination.        Of note: the patient has had several ED encounters in the last couple of months related to sciatica. - 05/26/22 ED visit: patient presented with right lower back pain and right hip pain x 2 weeks. X-rays of the lumbar spine and right hip performed showed no fractures ir deformities, and preservation of disc spaces. For suspected sciatica/muscle strain, she was prescribed Naproxen and Robaxin.         -- 07/13/22 Urgent care visit: patient presented for evaluation of bilateral lower back pain with radiation down her legs related to her bilateral sciatica. Given that she was able to ambulate and denied any  other symptoms, imaging was not performed, and she was given toradol and decadron injections. She was also prescribed tramadol.     ***              Allergies:  has No Known Allergies.  Meds: Current Outpatient Medications  Medication Sig Dispense Refill   albuterol (VENTOLIN HFA) 108 (90 Base) MCG/ACT inhaler [name] 8 g 0   gabapentin (NEURONTIN) 300 MG capsule Take 300 mg by mouth daily.     GAVILAX 17 GM/SCOOP powder Take by mouth.     ibuprofen (ADVIL) 600 MG tablet Take 1 tablet (600 mg total) by mouth every 6 (six) hours as needed. 30 tablet 0   lisinopril-hydrochlorothiazide (ZESTORETIC) 20-25 MG tablet [name]     methocarbamol (ROBAXIN) 500 MG tablet Take 1 tablet (500 mg total) by mouth 2 (two) times daily. 20 tablet 0   methylPREDNISolone (MEDROL DOSEPAK) 4 MG TBPK tablet Take as directed 21 tablet 0   naproxen (NAPROSYN) 375 MG tablet Take 1 tablet (375 mg total) by mouth 2 (two) times daily. 20 tablet 0   QUEtiapine (SEROQUEL) 100 MG tablet [name]     sertraline (ZOLOFT) 100 MG tablet [name]     silver sulfADIAZINE (SILVADENE) 1 % cream Apply 1 application topically daily. (Patient not taking: Reported on 12/17/2021) 50 g 0   traMADol (ULTRAM) 50 MG tablet Take 1 tablet (50 mg  total) by mouth every 6 (six) hours as needed. 15 tablet 0   traZODone (DESYREL) 100 MG tablet [name]      No current facility-administered medications for this encounter.    Physical Findings: The patient is in no acute distress. Patient is alert and oriented.  vitals were not taken for this visit. .  No significant changes. Lungs are clear to auscultation bilaterally. Heart has regular rate and rhythm. No palpable cervical, supraclavicular, or axillary adenopathy. Abdomen soft, non-tender, normal bowel sounds.  On  pelvic examination the external genitalia were unremarkable. A speculum exam was performed. There are no mucosal lesions noted in the vaginal vault. A Pap smear was obtained of the proximal vagina. On bimanual and rectovaginal examination there were no pelvic masses appreciated. ***    Lab Findings: Lab Results  Component Value Date   WBC 10.2 05/09/2021   HGB 12.9 05/09/2021   HCT 40.6 05/09/2021   MCV 84.8 05/09/2021   PLT 262 05/09/2021    Radiographic Findings: No results found.  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  ***  Plan:  ***   *** minutes of total time was spent for this patient encounter, including preparation, face-to-face counseling with the patient and coordination of care, physical exam, and documentation of the encounter. ____________________________________  Blair Promise, PhD, MD  This document serves as a record of services personally performed by Gery Pray, MD. It was created on his behalf by Roney Mans, a trained medical scribe. The creation of this record is based on the scribe's personal observations and the provider's statements to them. This document has been checked and approved by the attending provider.

## 2022-08-06 ENCOUNTER — Telehealth: Payer: Self-pay | Admitting: *Deleted

## 2022-08-06 NOTE — Telephone Encounter (Signed)
CALLED PATIENT TO ASK ABOUT RESCHEDULING MISSED FU APPT. ON 08-05-22, LVM FOR A RETURN CALL

## 2022-09-01 NOTE — Progress Notes (Incomplete)
Radiation Oncology         [phone] ________________________________  Name: Sarah Bean MRN: [id number]  Date: 09/02/2022  DOB: January 12, 1966  Follow-Up Visit Note  CC: [facility]  No diagnosis found.  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 She was last seen here for follow-up on 12/17/21. Since her last visit, the patient followed up with Dr. Berline Lopes on 04/01/22. During which time, the patient reported ongoing odorous vaginal discharge. To review, she was sent in a prescription for candidiasis during her last follow-up appointment and February. The patient called Dr. Charisse March office the week prior to her visit and was instructed to start taking candidiasis again. Since taking the medication, she reported some improvement in her vaginal discharge and denied any more odor. Otherwise, the patient denied any symptoms concerning for disease recurrence and was noted as NED on examination.        Of note: the patient has had several ED encounters in the last couple of months related to sciatica. - 05/26/22 ED visit: patient presented with right lower back pain and right hip pain x 2 weeks. X-rays of the lumbar spine and right hip performed showed no fractures ir deformities, and preservation of disc spaces. For suspected sciatica/muscle strain, she was prescribed Naproxen and Robaxin.         -- 07/13/22 Urgent care visit: patient presented for evaluation of bilateral lower back pain with radiation down her legs related to her bilateral sciatica. Given that she was able to ambulate and denied any  other symptoms, imaging was not performed, and she was given toradol and decadron injections. She was also prescribed tramadol.     ***              Allergies:  has No Known Allergies.  Meds: Current Outpatient Medications  Medication Sig Dispense Refill   albuterol (VENTOLIN HFA) 108 (90 Base) MCG/ACT inhaler [name] 8 g 0   gabapentin (NEURONTIN) 300 MG capsule Take 300 mg by mouth daily.     GAVILAX 17 GM/SCOOP powder Take by mouth.     ibuprofen (ADVIL) 600 MG tablet Take 1 tablet (600 mg total) by mouth every 6 (six) hours as needed. 30 tablet 0   lisinopril-hydrochlorothiazide (ZESTORETIC) 20-25 MG tablet [name]     methocarbamol (ROBAXIN) 500 MG tablet Take 1 tablet (500 mg total) by mouth 2 (two) times daily. 20 tablet 0   methylPREDNISolone (MEDROL DOSEPAK) 4 MG TBPK tablet Take as directed 21 tablet 0   naproxen (NAPROSYN) 375 MG tablet Take 1 tablet (375 mg total) by mouth 2 (two) times daily. 20 tablet 0   QUEtiapine (SEROQUEL) 100 MG tablet [name]     sertraline (ZOLOFT) 100 MG tablet [name]     silver sulfADIAZINE (SILVADENE) 1 % cream Apply 1 application topically daily. (Patient not taking: Reported on 12/17/2021) 50 g 0   traMADol (ULTRAM) 50 MG tablet Take 1 tablet (50 mg  total) by mouth every 6 (six) hours as needed. 15 tablet 0   traZODone (DESYREL) 100 MG tablet [name]      No current facility-administered medications for this encounter.    Physical Findings: The patient is in no acute distress. Patient is alert and oriented.  vitals were not taken for this visit. .  No significant changes. Lungs are clear to auscultation bilaterally. Heart has regular rate and rhythm. No palpable cervical, supraclavicular, or axillary adenopathy. Abdomen soft, non-tender, normal bowel sounds.  On  pelvic examination the external genitalia were unremarkable. A speculum exam was performed. There are no mucosal lesions noted in the vaginal vault. A Pap smear was obtained of the proximal vagina. On bimanual and rectovaginal examination there were no pelvic masses appreciated. ***    Lab Findings: Lab Results  Component Value Date   WBC 10.2 05/09/2021   HGB 12.9 05/09/2021   HCT 40.6 05/09/2021   MCV 84.8 05/09/2021   PLT 262 05/09/2021    Radiographic Findings: No results found.  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  ***  Plan:  ***   *** minutes of total time was spent for this patient encounter, including preparation, face-to-face counseling with the patient and coordination of care, physical exam, and documentation of the encounter. ____________________________________  Blair Promise, PhD, MD  This document serves as a record of services personally performed by Gery Pray, MD. It was created on his behalf by Roney Mans, a trained medical scribe. The creation of this record is based on the scribe's personal observations and the provider's statements to them. This document has been checked and approved by the attending provider.

## 2022-09-02 ENCOUNTER — Ambulatory Visit
Admission: RE | Admit: 2022-09-02 | Discharge: 2022-09-02 | Disposition: A | Discharge status: Home / Self Care | Payer: Medicaid Other | Source: Ambulatory Visit | Attending: Radiation Oncology | Admitting: Radiation Oncology

## 2022-09-02 ENCOUNTER — Telehealth: Payer: Self-pay

## 2022-09-02 NOTE — Telephone Encounter (Signed)
Called patient to see if she was in route for follow up appointment with Dr. Sondra Come on 09/02/22 @ 415pm. Patient states she thought her appointment was on 09/04/22. Patient has missed 3 follow up appointments with Dr.  Sondra Come. Patient to follow up with Dr. Berline Lopes 12/19.

## 2022-09-06 ENCOUNTER — Telehealth: Payer: Self-pay | Admitting: Oncology

## 2022-09-06 NOTE — Telephone Encounter (Signed)
Sarah Bean called and verified her appointment with Dr. Berline Lopes on 10/08/2022.

## 2022-09-18 ENCOUNTER — Encounter (INDEPENDENT_AMBULATORY_CARE_PROVIDER_SITE_OTHER): Payer: Self-pay

## 2022-10-08 ENCOUNTER — Other Ambulatory Visit: Payer: Self-pay

## 2022-10-08 ENCOUNTER — Encounter: Payer: Self-pay | Admitting: Gynecologic Oncology

## 2022-10-08 ENCOUNTER — Inpatient Hospital Stay: Payer: Medicaid Other | Attending: Gynecologic Oncology | Admitting: Gynecologic Oncology

## 2022-10-08 VITALS — BP 140/85 | HR 101 | Temp 98.4°F | Resp 20 | Ht 64.0 in | Wt 285.0 lb

## 2022-10-08 DIAGNOSIS — Z923 Personal history of irradiation: Secondary | ICD-10-CM | POA: Insufficient documentation

## 2022-10-08 DIAGNOSIS — C541 Malignant neoplasm of endometrium: Secondary | ICD-10-CM

## 2022-10-08 DIAGNOSIS — Z8542 Personal history of malignant neoplasm of other parts of uterus: Secondary | ICD-10-CM | POA: Insufficient documentation

## 2022-10-08 DIAGNOSIS — Z90722 Acquired absence of ovaries, bilateral: Secondary | ICD-10-CM | POA: Insufficient documentation

## 2022-10-08 DIAGNOSIS — Z90710 Acquired absence of both cervix and uterus: Secondary | ICD-10-CM | POA: Diagnosis not present

## 2022-10-08 NOTE — Patient Instructions (Addendum)
It was good to see you today.  I do not see or feel any evidence of cancer recurrence on your exam.  I will see you for follow-up in 6 months. You will see Dr. Sondra Come in 3 months.  As always, if you develop any new and concerning symptoms before your next visit, please call to see me sooner.

## 2022-10-08 NOTE — Progress Notes (Signed)
Gynecologic Oncology Return Clinic Visit  10/08/22  Reason for Visit: surveillance visit in the setting of HIR endometrial cancer   Treatment History: Her symptoms began approximately 10 years prior to diagnosis with abnormal uterine bleeding.  [name]  When her symptoms escalated with respect to the volume of bleeding she sought evaluation with Dr. Elly Modena on 06/22/2020.  Work-up of symptoms included a transvaginal ultrasound scan and endometrial biopsy. Transvaginal US on June 22, 2020 showed a uterus measuring 14.9 x 12.3 x 10.5 cm.  There were multiple fibroids within the uterus including a partially exophytic posterior uterine wall fibroid measuring 8 cm.  The endometrium was thick at 19 mm.  The left and right ovaries were not seen. 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    On 08/29/20 she underwent robotic assisted total hysterectomy for uterus greater than 250 g, [name], mini laparotomy for specimen delivery. Intraoperative findings were significant for morbid obesity with a BMI of 45 kg meters squared, a 20 cm uterus spanning sidewall to sidewall with limited sidewall visualization and exposure.  Normal ovaries bilaterally.  The uterus weighed 1300 g in the operating room.  Sentinel lymph node mapping was unable to be performed due to extreme abdominal obesity and the large bulky uterus preventing visualization of the sidewalls. Surgery was challenging due to body habitus but uncomplicated.  She required mini laparotomy for specimen delivery.  Final pathology revealed a grade 2 endometrioid endometrial adenocarcinoma with 2 mm of 41 mm myometrial invasion (inner half).  LVSI was present but focal.  The adnexa and cervix were negative for metastatic carcinoma. MMR intact, MSI stable.    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 [name]   [name], [name], no evidence of recurrent cancer.  Interval History: She missed appt this fall with Dr. Sondra Come.  She has been seen twice in the emergency department/urgent care since her last visit with me, for hip/sciatic pain.  She reports doing well today.  Denies any abdominal or pelvic pain.  Denies any vaginal bleeding.  Discharge responded well to boric acid, has intermittent discharge but overall significantly improved.  Denies any urinary symptoms.  Reports baseline bowel function.  Is using her vaginal dilator regularly.  Past Medical/Surgical History: Past Medical History:  Diagnosis Date   Anemia    Anxiety    Asthma    Bipolar disorder ([place])    Depression    Essential hypertension 05/26/2017   GERD (gastroesophageal reflux disease)    History of radiation therapy 10/18/2020-11/22/2020   vaginal brachytherapy Endometrium; Dr. 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[place])    Obesity    OSA (obstructive sleep apnea)    Pneumonia    history of   Restless leg syndrome    Stroke [facility])    TIA (transient ischemic attack)    Uterine cancer [facility])    endometrial    Past Surgical History:  Procedure Laterality Date   ABDOMINAL HYSTERECTOMY  08/2020   BREAST LUMPECTOMY Left    CESAREAN SECTION  x3   ROBOTIC ASSISTED TOTAL HYSTERECTOMY WITH BILATERAL SALPINGO OOPHERECTOMY N/A 08/29/2020   Procedure: XI ROBOTIC ASSISTED TOTAL HYSTERECTOMY WITH BILATERAL SALPINGO OOPHORECTOMY, GREATER THAN 250 GRAMS, MINI LAPAROTOMY;  [name], MD;  [name];  Service: Gynecology;   [name];   TUBAL LIGATION Bilateral 1990    Family History  Problem Relation Age of Onset   CAD Mother 66   Seizures Mother    Other Father 49       struck by lightning   Seizures Brother    Cancer Maternal Aunt        lung   Cancer Maternal Grandmother        breast   Cancer Paternal Grandmother        cervical   Multiple sclerosis Daughter    Autism Son    Stroke Neg Hx     Social History   Socioeconomic History   Marital status: Legally Separated    Spouse name: Not on file   Number of children: Not on file   Years of education: Not on file   Highest education level: Not on file  Occupational History   Occupation: disabled  Tobacco Use   Smoking status: Never   Smokeless tobacco: Never  Vaping Use   Vaping Use: Never used  Substance and Sexual Activity   Alcohol use: Not Currently    Comment: very seldom   Drug use: Never   Sexual activity: Not Currently    Partners: Male    Birth control/protection: None, [name], [name], [name], [name], Rfl: 0   gabapentin (NEURONTIN) 300 MG capsule, Take 300 mg by mouth daily., [name] , Rfl:    lisinopril-hydrochlorothiazide (ZESTORETIC) 20-25 MG tablet, [name], [name] , Rfl:    methocarbamol (ROBAXIN) 500 MG tablet, Take 1 tablet (500 mg total) by mouth 2 (two) times daily., [name] 20 tablet, Rfl: 0   naproxen (NAPROSYN) 375  MG tablet, Take 1 tablet (375 mg total) by mouth 2 (two) times daily., [name] 20 tablet, Rfl: 0   QUEtiapine (SEROQUEL) 100 MG tablet, [name], [name] , [name], [name], [name] , Rfl:    traMADol (ULTRAM) 50 MG tablet, Take 1 tablet (50 mg total) by mouth every 6 (six) hours as needed., [name], Rfl: 0   traZODone (DESYREL) 100 MG tablet, [name] , [name] , Rfl:   Review of Systems: Denies appetite changes, fevers, chills, fatigue, unexplained weight changes. Denies hearing loss, neck lumps or masses, mouth sores, ringing in ears or voice changes. Denies cough or wheezing.  Denies shortness of breath. Denies chest pain or palpitations. Denies leg swelling. Denies abdominal distention, pain, blood in stools, constipation, diarrhea, nausea, vomiting,  or early satiety. Denies pain with intercourse, dysuria, frequency, hematuria or incontinence. Denies hot flashes, pelvic pain, vaginal bleeding or vaginal discharge.   Denies joint pain, back pain or muscle pain/cramps. Denies itching, rash, or wounds. Denies dizziness, headaches, numbness or seizures. Denies swollen lymph nodes or glands, denies easy bruising or bleeding. Denies anxiety, depression, confusion, or decreased concentration.  Physical Exam: BP (!) 152/101 (BP Location: Left Arm, Patient Position: Sitting)   Pulse (!) 101   Temp 98.4 F (36.9 C) (Oral)   Resp 20   Ht _0  (1.626 m)   Wt 285 lb (129.3 kg)   LMP 08/10/2020 Comment: urine preg.pending.  08/29/20  SpO2 97%   BMI 48.92 kg/m  General: Alert, oriented, no acute distress. HEENT: Normocephalic, atraumatic, sclera anicteric. Chest: Clear to auscultation bilaterally.  No wheezes or rhonchi. Cardiovascular: Regular rate and rhythm, no murmurs. Abdomen: Obese, soft, nontender.  Normoactive bowel sounds.  No masses or hepatosplenomegaly appreciated.  Well-healed  incisions. Extremities: Grossly normal range of motion.  Warm, well perfused.  No edema bilaterally.  There is hypopigmentation of the skin along an area of the right lateral thigh.  No significant pain with palpation over this area. Skin: Noted above. Lymphatics: No cervical, supraclavicular, or inguinal adenopathy. GU: Normal appearing external genitalia without erythema, excoriation, or lesions.  Speculum exam reveals well rugated redundant vaginal mucosa, cuff intact, no lesions or masses, no bleeding or discharge appreciated.  Bimanual exam reveals cuff intact, no nodularity or masses.  Rectovaginal exam confirmed these findings.    Laboratory & Radiologic Studies: None new  Assessment & Plan: Sarah Bean is a 56 y.o. 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 MMR normal/MSI stable. High/intermediate risk factors for recurrence. S/p vaginal brachytherapy completed 11/22/20.   Patient is NED on exam today.  Overall doing well.   Vaginal discharge responded well to boric acid suppositories.  We discussed that she can use these as needed for symptoms of similar discharge.   Per NCCN surveillance recommendations, we will continue with surveillance visits every 3 months alternating between my office and radiation oncology.  She missed her last appointment with Dr. Sondra Come.  Will get her scheduled for a visit back in radiation oncology in the spring and she will return to see me in June.  Pap smear is not recommended in routine endometrial cancer surveillance. At 2 years after completion of adjuvant treatment, we will space these visits to every 6 months, and then annually if recurrence has not developed within 5 years.  22 minutes of total time was spent for this patient encounter, including preparation, face-to-face counseling with the patient and coordination of care, and documentation of the encounter.  Jeral Pinch, MD  Division of Gynecologic Oncology  Department of Obstetrics and Gynecology  [facility]

## 2022-12-19 ENCOUNTER — Encounter (INDEPENDENT_AMBULATORY_CARE_PROVIDER_SITE_OTHER): Payer: Medicaid Other | Admitting: Family Medicine

## 2022-12-23 ENCOUNTER — Telehealth: Payer: Self-pay | Admitting: *Deleted

## 2022-12-23 ENCOUNTER — Ambulatory Visit
Admission: RE | Admit: 2022-12-23 | Discharge: 2022-12-23 | Disposition: A | Discharge status: Home / Self Care | Payer: Medicaid Other | Source: Ambulatory Visit | Attending: Radiation Oncology | Admitting: Radiation Oncology

## 2022-12-23 NOTE — Progress Notes (Signed)
Patient states that she need to reschedule her appointment with Dr. Sondra Come. Will notify the scheduler and Dr. Sondra Come.

## 2022-12-23 NOTE — Telephone Encounter (Signed)
RETURNED PATIENT'S PHONE CALL, LVM FOR A RETURN CALL 

## 2022-12-26 ENCOUNTER — Ambulatory Visit
Admission: RE | Admit: 2022-12-26 | Discharge: 2022-12-26 | Disposition: A | Discharge status: Home / Self Care | Payer: Medicaid Other | Source: Ambulatory Visit | Attending: Radiation Oncology | Admitting: Radiation Oncology

## 2022-12-26 ENCOUNTER — Telehealth: Payer: Self-pay | Admitting: *Deleted

## 2022-12-26 NOTE — Telephone Encounter (Signed)
CALLED PATIENT TO ASK ABOUT MISSING TODAY'S FU, PATIENT AGREED TO COME ON 01-02-23 @ 4 PM, APPT.Marland Kitchen HAS BEEN SCHEDULED

## 2022-12-31 ENCOUNTER — Telehealth: Payer: Self-pay | Admitting: *Deleted

## 2022-12-31 NOTE — Telephone Encounter (Signed)
RETURNED PATIENT'S PHONE CALL, SPOKE WITH PATIENT. ?

## 2023-01-02 ENCOUNTER — Ambulatory Visit: Payer: Medicaid Other | Admitting: Radiation Oncology

## 2023-01-03 NOTE — Progress Notes (Signed)
Radiation Oncology         [phone] ________________________________  Name: Sarah Bean MRN: [id number]  Date: 01/06/2023  DOB: 01-Dec-1965  Follow-Up Visit Note  CC: [facility]    ICD-10-CM   1. 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 She was last seen here for follow up on 12/17/21. (She was not able to keep her last several follow-up visits).    In the interval since her last visit, the patient followed up with Dr. Pricilla Holm on 04/01/22. During which time, the patient endorsed continued odorous vaginal discharge (present prior to surgery), intermittent dysuria since radiation, baseline constipation, and occasional rectal pain with bowel movement. She began medication (boric acid) for her vaginal discharge a week prior to this visit with improvement. The patient otherwise denied any symptoms concerning for disease recurrence and she was noted as NED one examination.        The patient has had a few ED encounters in the interval, detailed as follows:  -- ED 05/26/22: Patient presented with lower back and right hip pain x 2 weeks. Work-up was consistent with a musculoskeletal etiology, and the patient was sent home with Naproxen and Robaxin for pain management.  -- ED 07/13/22: Patient presented with c/o intermittent bilateral lower back pain with radiation down her legs x several months. ED course included a toradol injection and a decadron injection, and she was sent home with ultram.    During her most recent follow-up visit with Dr.  Pricilla Holm on 10/08/22, the patient denied any symptoms concerning for disease recurrence and was noted as NED on examination. She also reported significant improvement in her odorous vaginal discharge with boric acid use.   On evaluation today the patient denies any vaginal bleeding or discharge.  She denies any hematuria.  Occasionally she will have some blood in her stools with straining.  She denies any abdominal bloating.  She reports low back pain and is undergoing physical therapy for this issue.  Allergies:  has No Known Allergies.  Meds: Current Outpatient Medications  Medication Sig Dispense Refill   albuterol (VENTOLIN HFA) 108 (90 Base) MCG/ACT inhaler [name] 8 g 0   gabapentin (NEURONTIN) 300 MG capsule Take 300 mg by mouth daily.     lisinopril-hydrochlorothiazide (ZESTORETIC) 20-25 MG tablet [name]     naproxen (NAPROSYN) 375 MG tablet Take 1 tablet (375 mg total) by mouth 2 (two) times daily. 20 tablet 0   QUEtiapine (SEROQUEL) 100 MG tablet [name]     sertraline (ZOLOFT) 100 MG tablet [name]     traZODone (DESYREL) 100 MG tablet [name]      methocarbamol (ROBAXIN) 500 MG tablet Take 1 tablet (500 mg total) by mouth 2 (two) times daily. (Patient not taking: Reported on 01/06/2023) 20 tablet 0   traMADol (ULTRAM) 50 MG tablet Take  1 tablet (50 mg total) by mouth every 6 (six) hours as needed. (Patient not taking: Reported on 01/06/2023) 15 tablet 0   No current facility-administered medications for this encounter.    Physical Findings: The patient is in no acute distress. Patient is alert and oriented.  height is 5\' 4"  (1.626 m) and weight is 276 lb 2 oz (125.2 kg). Her temporal temperature is 97.1 F (36.2 C) (abnormal). Her blood pressure is 168/102 (abnormal) and her pulse is 74. Her respiration  is 18 and oxygen saturation is 96%. .  No significant changes. Lungs are clear to auscultation bilaterally. Heart has regular rate and rhythm. No palpable cervical, supraclavicular, or axillary adenopathy. Abdomen soft, non-tender, normal bowel sounds.  On pelvic examination the external genitalia were unremarkable. A speculum exam was performed. There are no mucosal lesions noted in the vaginal vault.  On bimanual and rectovaginal examination there were no pelvic masses appreciated.  Vaginal cuff intact.  Rectal sphincter tone good.   Lab Findings: Lab Results  Component Value Date   WBC 10.2 05/09/2021   HGB 12.9 05/09/2021   HCT 40.6 05/09/2021   MCV 84.8 05/09/2021   PLT 262 05/09/2021    Radiographic Findings: No results found.  Impression: Stage IA grade 2 endometrioid endometrial adenocarcinoma with LVSI   No evidence of recurrence on clinical exam today.  She does not appear to be having any long-term effects from her surgery and vaginal brachytherapy.  Plan: She is scheduled to see Dr. Pricilla Holm in 3 months.  She is now 2 years out from her radiation therapy and I have scheduled her for follow-up in December which will be 6 months out from Dr. Winferd Humphrey follow-up appointment.   20 minutes of total time was spent for this patient encounter, including preparation, face-to-face counseling with the patient and coordination of care, physical exam, and documentation of the encounter. ____________________________________  Billie Lade, PhD, MD  This document serves as a record of services personally performed by Antony Blackbird, MD. It was created on his behalf by Neena Rhymes, a trained medical scribe. The creation of this record is based on the scribe's personal observations and the provider's statements to them. This document has been checked and approved by the attending provider.

## 2023-01-06 ENCOUNTER — Ambulatory Visit
Admission: RE | Admit: 2023-01-06 | Discharge: 2023-01-06 | Disposition: A | Discharge status: Home / Self Care | Payer: Medicaid Other | Source: Ambulatory Visit | Attending: Radiation Oncology | Admitting: Radiation Oncology

## 2023-01-06 ENCOUNTER — Encounter: Payer: Self-pay | Admitting: Radiation Oncology

## 2023-01-06 VITALS — BP 168/102 | HR 74 | Temp 97.1°F | Resp 18 | Ht 64.0 in | Wt 276.1 lb

## 2023-01-06 DIAGNOSIS — Z791 Long term (current) use of non-steroidal anti-inflammatories (NSAID): Secondary | ICD-10-CM | POA: Insufficient documentation

## 2023-01-06 DIAGNOSIS — Z79899 Other long term (current) drug therapy: Secondary | ICD-10-CM | POA: Insufficient documentation

## 2023-01-06 DIAGNOSIS — Z923 Personal history of irradiation: Secondary | ICD-10-CM | POA: Insufficient documentation

## 2023-01-06 DIAGNOSIS — Z8542 Personal history of malignant neoplasm of other parts of uterus: Secondary | ICD-10-CM | POA: Insufficient documentation

## 2023-01-06 DIAGNOSIS — C541 Malignant neoplasm of endometrium: Secondary | ICD-10-CM

## 2023-01-06 NOTE — Progress Notes (Signed)
Sarah Bean is here today for follow up post radiation to the pelvic.  They completed their radiation on: 11/22/20  Does the patient complain of any of the following:  Pain: No Abdominal bloating: No Diarrhea/Constipation: Constipation. Nausea/Vomiting: No Vaginal Discharge: Yes, thick white discharge with odor.  Blood in Urine or Stool: Yes in stool with bowel movements.  Urinary Issues (dysuria/incomplete emptying/ incontinence/ increased frequency/urgency): No Does patient report using vaginal dilator 2-3 times a week and/or sexually active 2-3 weeks: Yes Post radiation skin changes: No   Additional comments if applicable:  BP (!) [id number] (BP Location: Left Arm, Patient Position: Sitting)   Pulse 74   Temp (!) 97.1 F (36.2 C) (Temporal)   Resp 18   Ht 5\' 4"  (1.626 m)   Wt 276 lb 2 oz (125.2 kg)   LMP 08/10/2020 Comment: urine preg.pending.  08/29/20  SpO2 96%   BMI 47.40 kg/m

## 2023-01-29 ENCOUNTER — Encounter (INDEPENDENT_AMBULATORY_CARE_PROVIDER_SITE_OTHER): Payer: Medicaid Other | Admitting: Family Medicine

## 2023-04-03 ENCOUNTER — Inpatient Hospital Stay: Payer: Medicaid Other | Admitting: Gynecologic Oncology

## 2023-04-03 NOTE — Progress Notes (Unsigned)
Gynecologic Oncology Return Clinic Visit  04/03/23  Reason for Visit: surveillance visit in the setting of HIR endometrial cancer    Treatment History: Her symptoms began approximately 10 years prior to diagnosis with abnormal uterine bleeding.  [name]  When her symptoms escalated with respect to the volume of bleeding she sought evaluation with Dr. Jolayne Panther on 06/22/2020.  Work-up of symptoms included a transvaginal ultrasound scan and endometrial biopsy. Transvaginal US on June 22, 2020 showed a uterus measuring 14.9 x 12.3 x 10.5 cm.  There were multiple fibroids within the uterus including a partially exophytic posterior uterine wall fibroid measuring 8 cm.  The endometrium was thick at 19 mm.  The left and right ovaries were not seen. 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    On 08/29/20 she underwent robotic assisted total hysterectomy for uterus greater than 250 g, [name], mini laparotomy for specimen delivery. Intraoperative findings were significant for morbid obesity with a BMI of 45 kg meters squared, a 20 cm uterus spanning sidewall to sidewall with limited sidewall visualization and exposure.  Normal ovaries bilaterally.  The uterus weighed 1300 g in the operating room.  Sentinel lymph node mapping was unable to be performed due to extreme abdominal obesity and the large bulky uterus preventing visualization of the sidewalls. Surgery was challenging due to body habitus but uncomplicated.  She required mini laparotomy for specimen delivery.  Final pathology revealed a grade 2 endometrioid endometrial adenocarcinoma with 2 mm of 41 mm myometrial invasion (inner half).  LVSI was present but focal.  The adnexa and cervix were negative for metastatic carcinoma. MMR intact, MSI stable.    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 [name]   [name], [name], no evidence of recurrent cancer.  Interval History: ***  Past Medical/Surgical History: Past Medical History:  Diagnosis Date   Anemia    Anxiety    Asthma    Bipolar disorder (HCC)    Depression    Essential hypertension 05/26/2017   GERD (gastroesophageal reflux disease)    History of radiation therapy 10/18/2020-11/22/2020   vaginal brachytherapy Endometrium; Dr. Antony Blackbird   Insomnia    Manic depression (HCC)    Obesity    OSA (obstructive sleep apnea)    Pneumonia    history of   Restless leg syndrome    Stroke [facility])    TIA (transient ischemic attack)    Uterine cancer [facility])    endometrial    Past Surgical History:  Procedure Laterality Date   ABDOMINAL HYSTERECTOMY  08/2020   BREAST LUMPECTOMY Left    CESAREAN SECTION     x3   ROBOTIC ASSISTED TOTAL HYSTERECTOMY WITH BILATERAL SALPINGO OOPHERECTOMY N/A 08/29/2020   Procedure: XI ROBOTIC ASSISTED TOTAL HYSTERECTOMY WITH BILATERAL SALPINGO OOPHORECTOMY, GREATER THAN 250 GRAMS, MINI LAPAROTOMY;  Surgeon: Adolphus Birchwood, MD;  [name];  Service: Gynecology;  [name];   TUBAL LIGATION Bilateral 1990    Family History  Problem Relation Age of Onset   CAD Mother 34   Seizures Mother  Other Father 71       struck by lightning   Seizures Brother    Cancer Maternal Aunt        lung   Cancer Maternal Grandmother        breast   Cancer Paternal Grandmother        cervical   Multiple sclerosis Daughter    Autism Son    Stroke Neg Hx     Social History   Socioeconomic History   Marital  status: Legally Separated    Spouse name: Not on file   Number of children: Not on file   Years of education: Not on file   Highest education level: Not on file  Occupational History   Occupation: disabled  Tobacco Use   Smoking status: Never   Smokeless tobacco: Never  Vaping Use   Vaping Use: Never used  Substance and Sexual Activity   Alcohol use: Not Currently    Comment: very seldom   Drug use: Never   Sexual activity: Not Currently    Partners: Male    Birth control/protection: None, [name], [name], [name], [name], Rfl: 0   gabapentin (NEURONTIN) 300 MG capsule, Take 300 mg by mouth daily., [name] , Rfl:    lisinopril-hydrochlorothiazide (ZESTORETIC) 20-25 MG tablet, [name], [name] , Rfl:    methocarbamol (ROBAXIN) 500 MG tablet, Take 1 tablet (500 mg total) by mouth 2 (two) times daily. (Patient not taking: Reported on 01/06/2023), [name] 20 tablet, Rfl: 0   naproxen (NAPROSYN) 375 MG tablet, Take 1 tablet (375 mg total) by mouth 2 (two) times daily., [name] 20 tablet, Rfl: 0   QUEtiapine (SEROQUEL) 100 MG tablet, [name], [name] , [name], [name], [name] , Rfl:    traMADol (ULTRAM) 50 MG tablet, Take 1 tablet (50 mg total) by mouth every 6 (six) hours as needed. (Patient not taking: Reported on 01/06/2023),  [name], Rfl: 0   traZODone (DESYREL) 100 MG tablet, [name] , [name] , Rfl:   Review of Systems: Denies appetite changes, fevers, chills, fatigue, unexplained weight changes. Denies hearing loss, neck lumps or masses, mouth sores, ringing in ears or voice changes. Denies cough or wheezing.  Denies shortness of breath. Denies chest pain or palpitations. Denies leg swelling. Denies abdominal distention, pain, blood in stools, constipation, diarrhea, nausea, vomiting, or early satiety. Denies pain with intercourse, dysuria, frequency, hematuria or incontinence. Denies hot flashes, pelvic pain, vaginal bleeding or vaginal discharge.   Denies joint pain, back pain or muscle pain/cramps. Denies itching, rash, or wounds. Denies dizziness, headaches, numbness or seizures. Denies swollen lymph nodes or glands, denies easy bruising or bleeding. Denies anxiety, depression, confusion, or decreased concentration.  Physical  Exam: LMP 08/10/2020 Comment: urine preg.pending.  08/29/20 General: ***Alert, oriented, no acute distress. HEENT: ***Posterior oropharynx clear, sclera anicteric. Chest: ***Clear to auscultation bilaterally.  ***Port site clean. Cardiovascular: ***Regular rate and rhythm, no murmurs. Abdomen: ***Obese, soft, nontender.  Normoactive bowel sounds.  No masses or hepatosplenomegaly appreciated.  ***Well-healed scar. Extremities: ***Grossly normal range of motion.  Warm, well perfused.  No edema bilaterally. Skin: ***No rashes or lesions noted. Lymphatics: ***No cervical, supraclavicular, or inguinal adenopathy. GU: Normal appearing external genitalia without erythema, excoriation, or lesions.  Speculum exam reveals ***.  Bimanual exam reveals ***.  ***Rectovaginal exam  confirms ___.  Laboratory & Radiologic Studies: ***  Assessment & Plan: RESHELL DEMAYO is a 57 y.o. 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 MMR normal/MSI stable. High/intermediate risk factors for recurrence. S/p vaginal brachytherapy completed 11/22/20.   Patient is NED on exam today.  Overall doing well.   Vaginal discharge responded well to boric acid suppositories.  We discussed that she can use these as needed for symptoms of similar discharge.   Per NCCN surveillance recommendations, we will transition to surveillance visits every 6 months alternating between my office and radiation oncology.  She sees Dr. Roselind Messier in December.  I've asked her to call my office after the new year to schedule a visit with me in 03/2024.  We reviewed signs and symptoms that should prompt a phone call before her next scheduled visit.  Pap smear is not recommended in routine endometrial cancer surveillance.   *** minutes of total time was spent for this patient encounter, including preparation, face-to-face counseling with the patient and coordination of care, and documentation of the encounter.  Eugene Garnet, MD  Division of Gynecologic Oncology  Department of Obstetrics and Gynecology  [facility]

## 2023-05-15 ENCOUNTER — Inpatient Hospital Stay: Payer: Medicaid Other | Attending: Gynecologic Oncology | Admitting: Gynecologic Oncology

## 2023-05-15 ENCOUNTER — Encounter: Payer: Self-pay | Admitting: Gynecologic Oncology

## 2023-05-15 VITALS — BP 142/71 | HR 86 | Temp 99.6°F | Resp 20 | Ht 64.0 in | Wt 277.0 lb

## 2023-05-15 DIAGNOSIS — K5900 Constipation, unspecified: Secondary | ICD-10-CM | POA: Diagnosis not present

## 2023-05-15 DIAGNOSIS — Z08 Encounter for follow-up examination after completed treatment for malignant neoplasm: Secondary | ICD-10-CM | POA: Diagnosis present

## 2023-05-15 DIAGNOSIS — Z90722 Acquired absence of ovaries, bilateral: Secondary | ICD-10-CM | POA: Insufficient documentation

## 2023-05-15 DIAGNOSIS — Z8542 Personal history of malignant neoplasm of other parts of uterus: Secondary | ICD-10-CM

## 2023-05-15 DIAGNOSIS — Z6841 Body Mass Index (BMI) 40.0 and over, adult: Secondary | ICD-10-CM | POA: Insufficient documentation

## 2023-05-15 DIAGNOSIS — C541 Malignant neoplasm of endometrium: Secondary | ICD-10-CM

## 2023-05-15 DIAGNOSIS — Z90710 Acquired absence of both cervix and uterus: Secondary | ICD-10-CM | POA: Insufficient documentation

## 2023-05-15 DIAGNOSIS — K5909 Other constipation: Secondary | ICD-10-CM

## 2023-05-15 DIAGNOSIS — N898 Other specified noninflammatory disorders of vagina: Secondary | ICD-10-CM

## 2023-05-15 DIAGNOSIS — Z923 Personal history of irradiation: Secondary | ICD-10-CM | POA: Insufficient documentation

## 2023-05-15 MED ORDER — BORIC ACID VAGINAL 600 MG VA SUPP: 1.0000 | Freq: Every day | VAGINAL | 3 refills | Status: AC

## 2023-05-15 NOTE — Patient Instructions (Signed)
It was good to see you today.  I do not see or feel any evidence of cancer recurrence on your exam.  Will have my office reach out to you in the next couple weeks after you have started medication like MiraLAX for your constipation to make sure that the pain you are having improves.  I will see you for follow-up in 12 months.  Please call after your visit with radiation oncology in December or January to schedule a visit to see me next summer.  As always, if you develop any new and concerning symptoms before your next visit, please call to see me sooner.

## 2023-05-15 NOTE — Progress Notes (Signed)
Gynecologic Oncology Return Clinic Visit  05/15/23  Reason for Visit: surveillance visit in the setting of HIR endometrial cancer    Treatment History: Her symptoms began approximately 10 years prior to diagnosis with abnormal uterine bleeding.  [name]  When her symptoms escalated with respect to the volume of bleeding she sought evaluation with Dr. Jolayne Panther on 06/22/2020.  Work-up of symptoms included a transvaginal ultrasound scan and endometrial biopsy. Transvaginal US on June 22, 2020 showed a uterus measuring 14.9 x 12.3 x 10.5 cm.  There were multiple fibroids within the uterus including a partially exophytic posterior uterine wall fibroid measuring 8 cm.  The endometrium was thick at 19 mm.  The left and right ovaries were not seen. 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    On 08/29/20 she underwent robotic assisted total hysterectomy for uterus greater than 250 g, [name], mini laparotomy for specimen delivery. Intraoperative findings were significant for morbid obesity with a BMI of 45 kg meters squared, a 20 cm uterus spanning sidewall to sidewall with limited sidewall visualization and exposure.  Normal ovaries bilaterally.  The uterus weighed 1300 g in the operating room.  Sentinel lymph node mapping was unable to be performed due to extreme abdominal obesity and the large bulky uterus preventing visualization of the sidewalls. Surgery was challenging due to body habitus but uncomplicated.  She required mini laparotomy for specimen delivery.  Final pathology revealed a grade 2 endometrioid endometrial adenocarcinoma with 2 mm of 41 mm myometrial invasion (inner half).  LVSI was present but focal.  The adnexa and cervix were negative for metastatic carcinoma. MMR intact, MSI stable.    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 [name]   [name], [name], no evidence of recurrent cancer.  Interval History: Reports overall doing well.  Still having some vaginal discharge but this is improved.  She was unable to get vaginal boric acid suppositories.  Continues to struggle with constipation, not currently using anything for this.  Notes some pelvic pain in the last couple of weeks.  Denies any vaginal bleeding.  Reports baseline urinary function.  Past Medical/Surgical History: Past Medical History:  Diagnosis Date   Anemia    Anxiety    Asthma    Bipolar disorder (HCC)    Depression    Essential hypertension 05/26/2017   GERD (gastroesophageal reflux disease)    History of radiation therapy 10/18/2020-11/22/2020   vaginal brachytherapy Endometrium; Dr. Antony Blackbird   Insomnia    Manic depression (HCC)    Obesity    OSA (obstructive sleep apnea)    Pneumonia    history of   Restless leg syndrome    Stroke [facility])    TIA (transient ischemic attack)    Uterine cancer [facility])    endometrial    Past Surgical History:  Procedure Laterality Date   ABDOMINAL HYSTERECTOMY  08/2020   BREAST LUMPECTOMY Left    CESAREAN SECTION     x3   ROBOTIC ASSISTED TOTAL HYSTERECTOMY WITH BILATERAL SALPINGO OOPHERECTOMY N/A 08/29/2020   Procedure: XI  ROBOTIC ASSISTED TOTAL HYSTERECTOMY WITH BILATERAL SALPINGO OOPHORECTOMY, GREATER THAN 250 GRAMS, MINI LAPAROTOMY;  Surgeon: Adolphus Birchwood, MD;  [name];  Service: Gynecology;  [name];   TUBAL LIGATION Bilateral 1990    Family History  Problem Relation Age of Onset   CAD Mother 63    Seizures Mother    Other Father 31       struck by lightning   Seizures Brother    Cancer Maternal Aunt        lung   Cancer Maternal Grandmother        breast   Cancer Paternal Grandmother        cervical   Multiple sclerosis Daughter    Autism Son    Stroke Neg Hx     Social History   Socioeconomic History   Marital status: Legally Separated    Spouse name: Not on file   Number of children: Not on file   Years of education: Not on file   Highest education level: Not on file  Occupational History   Occupation: disabled  Tobacco Use   Smoking status: Never   Smokeless tobacco: Never  Vaping Use   Vaping status: Never Used  Substance and Sexual Activity   Alcohol use: Not Currently    Comment: very seldom   Drug use: Never   Sexual activity: Not Currently    Partners: Male    Birth control/protection: None, [name], who has autism. He vocalizes and acts out.   Social Determinants of Health   Financial Resource Strain: At Risk (08/28/2022)   Received from [employer], [place]   Financial [employer]    Financial Resource Strain: 2  Food Insecurity: At Risk (08/28/2022)   Received from [place], [place]   Food Insecurity    Food: 2  Transportation Needs: Not at Risk (08/28/2022)   Received from [place], [employer] Needs    Transportation: 1  Physical Activity: Not on File (02/07/2022)   Received from [place], [place]   Physical Activity    Physical Activity: 0  Stress: Not on File (02/07/2022)   Received from [facility], [place]   Stress    Stress: 0  Social Connections: Not on File (02/07/2022)   Received from [place], [place]   Social Connections    Social Connections and Isolation: 0    Current Medications:  Current Outpatient Medications:    albuterol (VENTOLIN HFA) 108 (90 Base) MCG/ACT inhaler, Inhale 1-2 puffs into the lungs every 6 (six) hours as needed for  wheezing or shortness of breath., [name], Rfl: 0   Boric Acid Vaginal 600 MG SUPP, Place 1 suppository vaginally daily for 10 days., [name] 30 suppository, Rfl: 3   gabapentin (NEURONTIN) 300 MG capsule, Take 300 mg by mouth daily., [name] , Rfl:    lisinopril-hydrochlorothiazide (ZESTORETIC) 20-25 MG tablet, [name], [name] , [name], [name], [name] , [name], [name], [name] , [name], [name] , [name] , Rfl:   Review of Systems: + Fatigue, ringing in ears, shortness of breath, wheezing, constipation, back pain, itching, headache, depression, anxiety. Denies appetite changes, fevers, chills, unexplained  weight changes. Denies hearing loss, neck lumps or masses, mouth sores,  or voice changes. Denies chest pain or palpitations. Denies leg swelling. Denies abdominal distention, pain, blood in stools, diarrhea, nausea, vomiting, or early satiety. Denies pain with intercourse, dysuria, frequency, hematuria or incontinence. Denies hot flashes, pelvic pain, vaginal bleeding or vaginal discharge.   Denies joint pain or muscle pain/cramps. Denies rash, or wounds. Denies dizziness, numbness or seizures. Denies swollen lymph nodes or glands, denies easy bruising or bleeding. Denies confusion, or decreased concentration.  Physical Exam: BP (!) 142/71 (BP Location: Left Arm)   Pulse 86   Temp 99.6 F (37.6 C) (Oral)   Resp 20   Ht 5\' 4"  (1.626 m)   Wt 277 lb (125.6 kg)   LMP 08/10/2020 Comment: urine preg.pending.  08/29/20  SpO2 97%   BMI 47.55 kg/m  General: Alert, oriented, no acute distress. HEENT: Normocephalic, atraumatic, sclera anicteric. Chest: Clear to auscultation bilaterally.  No wheezes or rhonchi. Cardiovascular: Regular rate and rhythm, no murmurs. Abdomen: Obese, soft, nontender.   Normoactive bowel sounds.  No masses or hepatosplenomegaly appreciated.  Well-healed incisions. Extremities: Grossly normal range of motion.  Warm, well perfused.  No edema bilaterally.  There is hypopigmentation of the skin along an area of the right lateral thigh.  No significant pain with palpation over this area. Skin: Noted above. Lymphatics: No cervical, supraclavicular, or inguinal adenopathy. GU: Normal appearing external genitalia without erythema, excoriation, or lesions.  Speculum exam reveals well rugated redundant vaginal mucosa, cuff intact, no lesions or masses, no bleeding or discharge appreciated.  Bimanual exam reveals cuff intact, no nodularity or masses.  Rectovaginal exam confirmed these findings.    Laboratory & Radiologic Studies: none  Assessment & Plan: Sarah Bean is a 57 y.o. 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 MMR normal/MSI stable. High/intermediate risk factors for recurrence. S/p vaginal brachytherapy completed 11/22/20.   Patient is NED on exam today.  Overall doing well.  She is having some abdominal pain which I suspect is related to constipation.  She is not taking anything for constipation.  I encouraged her to start MiraLAX.  I will have my office reach out to her in a couple of weeks to see if symptoms improve.   Vaginal discharge improved although the patient was not able to get boric acid suppositories.  We talked about this again today and I sent in a prescription for these.  We discussed that she can use these as needed for symptoms of similar discharge.   Per NCCN surveillance recommendations, we are now more than 2 years out from completion of adjuvant therapy and will space visits out to every 6 months.  She sees Dr. Roselind Messier in 6 months.  I have asked her to call after that visit to schedule a visit to see me this time next year.  Pap smear is not recommended in routine  endometrial cancer surveillance.  We reviewed signs and symptoms that should prompt a phone call between visits..  22 minutes of total time was spent for this patient encounter, including preparation, face-to-face counseling with the patient and coordination of care, and documentation of the encounter.  Eugene Garnet, MD  Division of Gynecologic Oncology  Department of Obstetrics and Gynecology  [facility]

## 2023-05-29 ENCOUNTER — Telehealth: Payer: Self-pay

## 2023-05-29 NOTE — Telephone Encounter (Signed)
I spoke to Sarah Bean this morning to follow up from her office visit on 7/25 with Dr. Pricilla Holm. Pt states her pelvic pain is better. She is having normal BM's and discharge has also improved.  Pt aware to continue pelvic pain/ constipation and call with any concerns.

## 2023-10-05 NOTE — Progress Notes (Signed)
Radiation Oncology         [phone] ________________________________  Name: Sarah Bean MRN: [id number]  Date: 10/06/2023  DOB: 1966-02-24  Follow-Up Visit Note  CC: [facility], [facility]  No diagnosis found.  Diagnosis: Stage IA grade 2 endometrioid endometrial adenocarcinoma with LVSI   Interval Since Last Radiation: 2 years, 10 months, and 14 days   Radiation Treatment Dates: 10/18/2020 through 11/22/2020   Site: Vagina; Pelvis Technique: HDR-brachy Total Dose (Gy): 30/30 Dose per Fx (Gy): 6 Completed Fx: 5/5 Beam Energies: Ir-192  Narrative:  The patient returns today for routine follow-up. She was last seen here for follow-up on 01/06/23.   Since her last visit,  the patient followed up with Dr. Pricilla Holm on 05/15/23. During which time, the patient reported having some ongoing vaginal discharge and constipation. She also endorsed having several weeks of pelvic pain, which Dr. Pricilla Holm attributes to her constipation. Dr. Pricilla Holm has encouraged her to start MiraLAX for this and has prescribed boric acid suppositories for her ongoing vaginal discharge. She otherwise denied any symptoms concerning for disease recurrence and was noted as NED on examination.     No other significant oncologic interval history since the patient was last seen for follow-up.   ***                           Allergies:  has no known allergies.  Meds: Current Outpatient Medications  Medication Sig Dispense Refill   albuterol (VENTOLIN HFA) 108 (90 Base) MCG/ACT inhaler [name] 8 g 0   gabapentin (NEURONTIN) 300 MG capsule Take 300 mg by mouth daily.     lisinopril-hydrochlorothiazide (ZESTORETIC) 20-25 MG tablet [name]     QUEtiapine (SEROQUEL) 100 MG tablet [name]     sertraline (ZOLOFT) 100 MG tablet Take 100 mg by  mouth daily.     traZODone (DESYREL) 100 MG tablet [name]      No current facility-administered medications for this encounter.    Physical Findings: The patient is in no acute distress. Patient is alert and oriented.  vitals were not taken for this visit. .  No significant changes. Lungs are clear to auscultation bilaterally. Heart has regular rate and rhythm. No palpable cervical, supraclavicular, or axillary adenopathy. Abdomen soft, non-tender, normal bowel sounds.  On pelvic examination the external genitalia were unremarkable. A speculum exam was performed. There are no mucosal lesions noted in the vaginal vault. A Pap smear was obtained of the proximal vagina. On bimanual and rectovaginal examination there were no pelvic masses appreciated. ***    Lab Findings: Lab Results  Component Value Date   WBC 10.2 05/09/2021   HGB 12.9 05/09/2021   HCT 40.6 05/09/2021   MCV 84.8 05/09/2021   PLT 262 05/09/2021    Radiographic Findings: No results found.  Impression: Stage IA grade 2 endometrioid endometrial adenocarcinoma with LVSI   The patient is recovering from the effects of radiation.  ***  Plan:  ***   *** minutes of total time was spent for this patient encounter, including preparation, face-to-face counseling with the patient and coordination of care, physical exam, and documentation of the encounter. ____________________________________  Billie Lade, PhD, MD  This document serves as a record  of services personally performed by Antony Blackbird, MD. It was created on his behalf by Neena Rhymes, a trained medical scribe. The creation of this record is based on the scribe's personal observations and the provider's statements to them. This document has been checked and approved by the attending provider.

## 2023-10-06 ENCOUNTER — Ambulatory Visit
Admission: RE | Admit: 2023-10-06 | Discharge: 2023-10-06 | Disposition: A | Discharge status: Home / Self Care | Payer: Medicaid Other | Source: Ambulatory Visit | Attending: Radiation Oncology | Admitting: Radiation Oncology

## 2023-10-06 ENCOUNTER — Encounter: Payer: Self-pay | Admitting: Radiation Oncology

## 2023-10-06 VITALS — BP 168/95 | HR 81 | Temp 97.5°F | Resp 20 | Ht 64.0 in | Wt 283.4 lb

## 2023-10-06 DIAGNOSIS — C541 Malignant neoplasm of endometrium: Secondary | ICD-10-CM | POA: Diagnosis present

## 2023-10-06 DIAGNOSIS — Z79899 Other long term (current) drug therapy: Secondary | ICD-10-CM | POA: Diagnosis not present

## 2023-10-06 DIAGNOSIS — Z923 Personal history of irradiation: Secondary | ICD-10-CM | POA: Diagnosis not present

## 2023-10-06 NOTE — Progress Notes (Signed)
Sarah Bean is here today for follow up post radiation to the pelvic.  They completed their radiation on: 11/22/20   Does the patient complain of any of the following:  Pain: No Abdominal bloating: Yes Diarrhea/Constipation: No Nausea/Vomiting: No Vaginal Discharge: Yes, thick white discharge.  Blood in Urine or Stool: No Urinary Issues (dysuria/incomplete emptying/ incontinence/ increased frequency/urgency): No Does patient report using vaginal dilator 2-3 times a week and/or sexually active 2-3 weeks: Yes vaginal dilators.  Post radiation skin changes: No  Additional comments if applicable:  BP (!) 168/95 (BP Location: Right Arm, Patient Position: Sitting, Cuff Size: Large)   Pulse 81   Temp (!) 97.5 F (36.4 C)   Resp 20   Ht 5\' 4"  (1.626 m)   Wt 283 lb 6.4 oz (128.5 kg)   LMP 08/10/2020 Comment: urine preg.pending.  08/29/20  SpO2 95%   BMI 48.65 kg/m

## 2024-02-12 ENCOUNTER — Telehealth: Payer: Self-pay | Admitting: *Deleted

## 2024-02-12 NOTE — Telephone Encounter (Addendum)
 Sarah Bean from ([facility]) called office.  Pt is scheduled for a follow up on June 25, 2024 at at 1:30 with Dr. Orvil Bland.  Sarah Bean to notify pt of appointment date and time.

## 2024-06-25 ENCOUNTER — Other Ambulatory Visit: Payer: Self-pay

## 2024-06-25 ENCOUNTER — Inpatient Hospital Stay: Attending: Gynecologic Oncology | Admitting: Gynecologic Oncology

## 2024-06-25 ENCOUNTER — Inpatient Hospital Stay

## 2024-06-25 ENCOUNTER — Ambulatory Visit: Payer: Self-pay | Admitting: Gynecologic Oncology

## 2024-06-25 ENCOUNTER — Encounter: Payer: Self-pay | Admitting: Gynecologic Oncology

## 2024-06-25 VITALS — BP 158/124 | HR 91 | Temp 98.4°F | Resp 20 | Wt 289.0 lb

## 2024-06-25 DIAGNOSIS — Z6841 Body Mass Index (BMI) 40.0 and over, adult: Secondary | ICD-10-CM | POA: Diagnosis not present

## 2024-06-25 DIAGNOSIS — Z90722 Acquired absence of ovaries, bilateral: Secondary | ICD-10-CM | POA: Insufficient documentation

## 2024-06-25 DIAGNOSIS — R300 Dysuria: Secondary | ICD-10-CM

## 2024-06-25 DIAGNOSIS — N898 Other specified noninflammatory disorders of vagina: Secondary | ICD-10-CM | POA: Diagnosis not present

## 2024-06-25 DIAGNOSIS — C541 Malignant neoplasm of endometrium: Secondary | ICD-10-CM

## 2024-06-25 DIAGNOSIS — Z9079 Acquired absence of other genital organ(s): Secondary | ICD-10-CM | POA: Diagnosis not present

## 2024-06-25 DIAGNOSIS — Z90710 Acquired absence of both cervix and uterus: Secondary | ICD-10-CM | POA: Diagnosis not present

## 2024-06-25 DIAGNOSIS — Z8542 Personal history of malignant neoplasm of other parts of uterus: Secondary | ICD-10-CM | POA: Diagnosis present

## 2024-06-25 LAB — URINALYSIS, COMPLETE (UACMP) WITH MICROSCOPIC
Bacteria, UA: NONE SEEN
Bilirubin Urine: NEGATIVE
Glucose, UA: NEGATIVE mg/dL
Hgb urine dipstick: NEGATIVE
Ketones, ur: NEGATIVE mg/dL
Leukocytes,Ua: NEGATIVE
Nitrite: NEGATIVE
Protein, ur: NEGATIVE mg/dL
Specific Gravity, Urine: 1.021 (ref 1.005–1.030)
pH: 7 (ref 5.0–8.0)

## 2024-06-25 NOTE — Progress Notes (Signed)
 Gynecologic Oncology Return Clinic Visit  06/25/24  Reason for Visit: surveillance visit in the setting of HIR endometrial cancer    Treatment History: Her symptoms began approximately 10 years prior to diagnosis with abnormal uterine bleeding.  [name]  When her symptoms escalated with respect to the volume of bleeding she sought evaluation with Dr. Alger on 06/22/2020.  Work-up of symptoms included a transvaginal ultrasound scan and endometrial biopsy. Transvaginal US  on June 22, 2020 showed a uterus measuring 14.9 x 12.3 x 10.5 cm.  There were multiple fibroids within the uterus including a partially exophytic posterior uterine wall fibroid measuring 8 cm.  The endometrium was thick at 19 mm.  The left and right ovaries were not seen. 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    On 08/29/20 she underwent robotic assisted total hysterectomy for uterus greater than 250 g, [name], mini laparotomy for specimen delivery. Intraoperative findings were significant for morbid obesity with a BMI of 45 kg meters squared, a 20 cm uterus spanning sidewall to sidewall with limited sidewall visualization and exposure.  Normal ovaries bilaterally.  The uterus weighed 1300 g in the operating room.  Sentinel lymph node mapping was unable to be performed due to extreme abdominal obesity and the large bulky uterus preventing visualization of the sidewalls. Surgery was challenging due to body habitus but uncomplicated.  She required mini laparotomy for specimen delivery.  Final pathology revealed a grade 2 endometrioid endometrial adenocarcinoma with 2 mm of 41 mm myometrial invasion (inner half).  LVSI was present but focal.  The adnexa and cervix were negative for metastatic carcinoma. MMR intact, MSI stable.    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 [name]   [name], [name], no evidence of recurrent cancer.  Interval History: Doing well.  Denies any vaginal bleeding.  Denies abdominal or pelvic pain.  Reports baseline bowel function.  Endorses some intermittent dysuria, was having some earlier this week.  Vaginal discharge has been much better with intermittent use of boric acid.  Past Medical/Surgical History: Past Medical History:  Diagnosis Date   Anemia    Anxiety    Asthma    Bipolar disorder (HCC)    Depression    Essential hypertension 05/26/2017   GERD (gastroesophageal reflux disease)    History of radiation therapy 10/18/2020-11/22/2020   vaginal brachytherapy Endometrium; Dr. Lynwood Nasuti   Insomnia    Manic depression (HCC)    Obesity    OSA (obstructive sleep apnea)    Pneumonia    history of   Restless leg syndrome    Stroke [facility])    TIA (transient ischemic attack)    Uterine cancer (HCC)    endometrial    Past Surgical History:  Procedure Laterality Date   ABDOMINAL HYSTERECTOMY  08/2020   BREAST LUMPECTOMY Left    CESAREAN SECTION     x3   ROBOTIC ASSISTED TOTAL HYSTERECTOMY WITH BILATERAL SALPINGO OOPHERECTOMY N/A 08/29/2020   Procedure: XI ROBOTIC ASSISTED TOTAL HYSTERECTOMY WITH BILATERAL SALPINGO OOPHORECTOMY, GREATER THAN 250 GRAMS, MINI LAPAROTOMY;  Surgeon: Eloy Herring, MD;  [name];  Service: Gynecology;  [name];   TUBAL LIGATION Bilateral 1990    Family History  Problem Relation Age of Onset   CAD Mother 50   Seizures Mother    Other Father 9       struck by lightning   Seizures Brother     Cancer Maternal Aunt        lung   Cancer Maternal Grandmother        breast   Cancer Paternal Grandmother        cervical   Multiple sclerosis Daughter    Autism Son    Stroke Neg Hx     Social History   Socioeconomic History   Marital status: Legally Separated    Spouse name: Not on file   Number of children: Not on file   Years of education: Not on file   Highest education level: Not on file  Occupational History   Occupation: disabled  Tobacco Use   Smoking status: Never   Smokeless tobacco: Never  Vaping Use   Vaping status: Never Used  Substance and Sexual Activity   Alcohol use: Not Currently    Comment: very seldom   Drug use: Never   Sexual activity: Not Currently    Partners: Male    Birth control/protection: None, [name], who has autism. He vocalizes and acts out.   Social Drivers of Corporate investment banker Strain: Not on File (08/28/2022)   Received from [employer]    Financial Resource Strain: 0  Recent Concern: Physicist, medical Strain - At Risk (08/28/2022)   Received from [employer]    Financial Resource Strain: 2  Food Insecurity: Not on File (08/28/2022)   Received from [employer]    Food: 0  Recent Concern: Food Insecurity - At Risk (08/28/2022)   Received from [employer]    Food: 2  Transportation Needs: Not on File (08/28/2022)   Received from [employer] Needs    Transportation: 0  Physical Activity: Not on File (02/07/2022)   Received from [facility]   Physical Activity    Physical Activity: 0  Stress: Not on File (02/07/2022)   Received from [facility]   Stress    Stress: 0  Social Connections: Not on File (06/30/2023)   Received from [employer]   Social Connections    Connectedness: 0    Current Medications:  Current Outpatient Medications:     albuterol  (VENTOLIN  HFA) 108 (90 Base) MCG/ACT inhaler, [name], [name], Rfl: 0   gabapentin  (NEURONTIN ) 300 MG capsule, Take 300 mg by mouth daily., [name] , Rfl:    lisinopril -hydrochlorothiazide  (ZESTORETIC ) 20-25 MG tablet, [name], [name] , Rfl:    QUEtiapine  (SEROQUEL ) 100 MG tablet, [name], [name] , [name], [name], [name] , Rfl:    traZODone  (DESYREL ) 100 MG tablet, [name] , [name] , Rfl:   Review of Systems: Denies appetite changes, fevers, chills, fatigue, unexplained weight changes. Denies hearing loss, neck lumps or  masses, mouth sores, ringing in ears or voice changes. Denies cough or wheezing.  Denies shortness of breath. Denies chest pain or palpitations. Denies leg swelling. Denies abdominal distention, pain, blood in stools, constipation, diarrhea, nausea, vomiting, or early satiety. Denies pain with intercourse, dysuria, frequency, hematuria or incontinence. Denies hot flashes, pelvic pain, vaginal bleeding or vaginal discharge.   Denies joint pain, back pain or muscle pain/cramps. Denies itching, rash, or wounds. Denies dizziness, headaches, numbness or seizures. Denies swollen lymph nodes or glands, denies easy bruising or bleeding. Denies anxiety, depression, confusion, or decreased concentration.  Physical Exam: BP (!) 158/124 (BP Location: Left Arm, Patient Position: Sitting)   Pulse 91   Temp 98.4 F (36.9 C) (Oral)   Resp 20   Wt 289 lb (131.1 kg)   LMP 08/10/2020 Comment: urine preg.pending.  08/29/20  SpO2 96%   BMI 49.61 kg/m  General: Alert, oriented, no acute distress. HEENT: Normocephalic, atraumatic, sclera anicteric. Chest: Clear to auscultation bilaterally.  No wheezes or rhonchi. Cardiovascular: Regular rate and rhythm, no  murmurs. Abdomen: Obese, soft, nontender.  Normoactive bowel sounds.  No masses or hepatosplenomegaly appreciated.  Well-healed incisions. Extremities: Grossly normal range of motion.  Warm, well perfused.  No edema bilaterally.  Skin: Noted above. Lymphatics: No cervical, supraclavicular, or inguinal adenopathy. GU: Normal appearing external genitalia without erythema, excoriation, or lesions.  Speculum exam reveals well rugated redundant vaginal mucosa, cuff intact, no lesions or masses, no bleeding or discharge appreciated.  Bimanual exam reveals cuff intact, no nodularity or masses.  Rectovaginal exam confirmed these findings.    Laboratory & Radiologic Studies: None new  Assessment & Plan: Sarah Bean is a 58 y.o. 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 MMR normal/MSI stable. High/intermediate risk factors for recurrence. S/p vaginal brachytherapy completed 11/22/20.   Patient is NED on exam today.  Overall doing well.   Endorses some recent dysuria.  Plan to send UA, will culture only if concerning for urinary tract infection.   Vaginal discharge improved with the use of boric acid.   Per NCCN surveillance recommendations, we will continue with visits every 6 months.  She sees Dr. Shannon in 6 months.  We will see her back in one year.  Pap smear is not recommended in routine endometrial cancer surveillance.  We reviewed signs and symptoms that should prompt a phone call between visits.  20 minutes of total time was spent for this patient encounter, including preparation, face-to-face counseling with the patient and coordination of care, and documentation of the encounter.  Comer Dollar, MD  Division of Gynecologic Oncology  Department of Obstetrics and Gynecology  [facility]

## 2024-06-25 NOTE — Patient Instructions (Signed)
 It was good to see you today.  I do not see or feel any evidence of cancer recurrence on your exam.  We will see you for follow-up in 12 months.  As always, if you develop any new and concerning symptoms before your next visit, please call to see me sooner.

## 2024-08-13 ENCOUNTER — Ambulatory Visit: Admitting: Gynecologic Oncology

## 2024-10-04 ENCOUNTER — Ambulatory Visit: Payer: Self-pay | Admitting: Radiation Oncology

## 2024-12-20 ENCOUNTER — Ambulatory Visit: Admitting: Radiation Oncology

## 2025-06-21 ENCOUNTER — Ambulatory Visit: Admitting: Gynecologic Oncology
